# Patient Record
Sex: Male | Born: 1970 | Race: White | Hispanic: No | Marital: Single | Smoking: Current every day smoker
Health system: Southern US, Community
[De-identification: ages and names within clinical notes are randomized; demographics above are authoritative.]

## PROBLEM LIST (undated history)

## (undated) DIAGNOSIS — F1010 Alcohol abuse, uncomplicated: Secondary | ICD-10-CM

---

## 2018-10-10 ENCOUNTER — Emergency Department (HOSPITAL_BASED_OUTPATIENT_CLINIC_OR_DEPARTMENT_OTHER)
Admission: EM | Admit: 2018-10-10 | Discharge: 2018-10-10 | Disposition: A | Discharge status: Home / Self Care | Payer: 59 | Attending: Emergency Medicine | Admitting: Emergency Medicine

## 2018-10-10 ENCOUNTER — Encounter (HOSPITAL_BASED_OUTPATIENT_CLINIC_OR_DEPARTMENT_OTHER): Payer: Self-pay | Admitting: *Deleted

## 2018-10-10 DIAGNOSIS — H53149 Visual discomfort, unspecified: Secondary | ICD-10-CM | POA: Insufficient documentation

## 2018-10-10 DIAGNOSIS — R42 Dizziness and giddiness: Secondary | ICD-10-CM | POA: Diagnosis present

## 2018-10-10 DIAGNOSIS — F0781 Postconcussional syndrome: Secondary | ICD-10-CM

## 2018-10-10 LAB — CBC WITH DIFFERENTIAL/PLATELET
Abs Immature Granulocytes: 0.03 10*3/uL (ref 0.00–0.07)
Basophils Absolute: 0.1 10*3/uL (ref 0.0–0.1)
Basophils Relative: 1 %
Eosinophils Absolute: 0.3 10*3/uL (ref 0.0–0.5)
Eosinophils Relative: 3 %
HCT: 44.5 % (ref 39.0–52.0)
Hemoglobin: 14.7 g/dL (ref 13.0–17.0)
Immature Granulocytes: 0 %
Lymphocytes Relative: 23 %
Lymphs Abs: 2 10*3/uL (ref 0.7–4.0)
MCH: 33.4 pg (ref 26.0–34.0)
MCHC: 33 g/dL (ref 30.0–36.0)
MCV: 101.1 fL — ABNORMAL HIGH (ref 80.0–100.0)
Monocytes Absolute: 0.9 10*3/uL (ref 0.1–1.0)
Monocytes Relative: 10 %
Neutro Abs: 5.3 10*3/uL (ref 1.7–7.7)
Neutrophils Relative %: 63 %
Platelets: 273 10*3/uL (ref 150–400)
RBC: 4.4 MIL/uL (ref 4.22–5.81)
RDW: 13.2 % (ref 11.5–15.5)
WBC: 8.5 10*3/uL (ref 4.0–10.5)
nRBC: 0 % (ref 0.0–0.2)

## 2018-10-10 LAB — BASIC METABOLIC PANEL
Anion gap: 9 (ref 5–15)
BUN: 13 mg/dL (ref 6–20)
CO2: 22 mmol/L (ref 22–32)
Calcium: 8.7 mg/dL — ABNORMAL LOW (ref 8.9–10.3)
Chloride: 107 mmol/L (ref 98–111)
Creatinine, Ser: 0.75 mg/dL (ref 0.61–1.24)
GFR calc Af Amer: >60 mL/min (ref >60)
GFR calc non Af Amer: >60 mL/min (ref >60)
Glucose, Bld: 88 mg/dL (ref 70–99)
Potassium: 3.6 mmol/L (ref 3.5–5.1)
Sodium: 138 mmol/L (ref 135–145)

## 2018-10-10 MED ORDER — MECLIZINE HCL 25 MG PO TABS
25.0000 mg | ORAL_TABLET | Freq: Once | ORAL | Status: AC
  Administered 2018-10-10: 25 mg via ORAL
  Filled 2018-10-10: qty 1

## 2018-10-10 MED ORDER — MECLIZINE HCL 25 MG PO TABS: 25.0000 mg | ORAL_TABLET | Freq: Three times a day (TID) | ORAL | 0 refills | Status: DC | PRN

## 2018-10-10 NOTE — ED Notes (Signed)
Pt verbalized understanding of dc instructions.
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
## 2018-10-10 NOTE — Discharge Instructions (Signed)
It was my pleasure taking care of you today!   I have given you a prescription for Antivert (same medication you received in ER today) that you can take as needed for dizziness. Stay hydrated. Try your best to avoid triggers such as bright lights or loud sounds.   Call the sports medicine clinic listed to schedule a follow up appointment to further discuss your continued symptoms. I would recommend calling them tomorrow morning.   Return to ER for new or worsening symptoms, any additional concerns.

## 2019-09-21 ENCOUNTER — Inpatient Hospital Stay (HOSPITAL_BASED_OUTPATIENT_CLINIC_OR_DEPARTMENT_OTHER)
Admission: EM | Admit: 2019-09-21 | Discharge: 2019-09-27 | DRG: 871 | Disposition: A | Discharge status: Home / Self Care | Payer: 59 | Attending: Internal Medicine | Admitting: Internal Medicine

## 2019-09-21 ENCOUNTER — Other Ambulatory Visit: Payer: Self-pay

## 2019-09-21 ENCOUNTER — Emergency Department (HOSPITAL_BASED_OUTPATIENT_CLINIC_OR_DEPARTMENT_OTHER): Payer: 59

## 2019-09-21 ENCOUNTER — Inpatient Hospital Stay (HOSPITAL_BASED_OUTPATIENT_CLINIC_OR_DEPARTMENT_OTHER): Payer: 59

## 2019-09-21 ENCOUNTER — Encounter (HOSPITAL_BASED_OUTPATIENT_CLINIC_OR_DEPARTMENT_OTHER): Payer: Self-pay | Admitting: *Deleted

## 2019-09-21 DIAGNOSIS — R079 Chest pain, unspecified: Secondary | ICD-10-CM | POA: Diagnosis present

## 2019-09-21 DIAGNOSIS — J9601 Acute respiratory failure with hypoxia: Secondary | ICD-10-CM | POA: Diagnosis present

## 2019-09-21 DIAGNOSIS — A419 Sepsis, unspecified organism: Secondary | ICD-10-CM | POA: Diagnosis present

## 2019-09-21 DIAGNOSIS — I447 Left bundle-branch block, unspecified: Secondary | ICD-10-CM | POA: Diagnosis present

## 2019-09-21 DIAGNOSIS — E440 Moderate protein-calorie malnutrition: Secondary | ICD-10-CM | POA: Diagnosis present

## 2019-09-21 DIAGNOSIS — E871 Hypo-osmolality and hyponatremia: Secondary | ICD-10-CM | POA: Diagnosis present

## 2019-09-21 DIAGNOSIS — R6520 Severe sepsis without septic shock: Secondary | ICD-10-CM | POA: Diagnosis present

## 2019-09-21 DIAGNOSIS — Z20822 Contact with and (suspected) exposure to covid-19: Secondary | ICD-10-CM | POA: Diagnosis present

## 2019-09-21 DIAGNOSIS — F17210 Nicotine dependence, cigarettes, uncomplicated: Secondary | ICD-10-CM | POA: Diagnosis present

## 2019-09-21 DIAGNOSIS — E878 Other disorders of electrolyte and fluid balance, not elsewhere classified: Secondary | ICD-10-CM | POA: Diagnosis present

## 2019-09-21 DIAGNOSIS — R9431 Abnormal electrocardiogram [ECG] [EKG]: Secondary | ICD-10-CM | POA: Diagnosis not present

## 2019-09-21 DIAGNOSIS — Z9689 Presence of other specified functional implants: Secondary | ICD-10-CM

## 2019-09-21 DIAGNOSIS — E861 Hypovolemia: Secondary | ICD-10-CM | POA: Diagnosis present

## 2019-09-21 DIAGNOSIS — E876 Hypokalemia: Secondary | ICD-10-CM | POA: Diagnosis present

## 2019-09-21 DIAGNOSIS — D649 Anemia, unspecified: Secondary | ICD-10-CM | POA: Diagnosis present

## 2019-09-21 DIAGNOSIS — J189 Pneumonia, unspecified organism: Secondary | ICD-10-CM | POA: Diagnosis present

## 2019-09-21 DIAGNOSIS — R7989 Other specified abnormal findings of blood chemistry: Secondary | ICD-10-CM

## 2019-09-21 DIAGNOSIS — A409 Streptococcal sepsis, unspecified: Secondary | ICD-10-CM | POA: Diagnosis not present

## 2019-09-21 DIAGNOSIS — Z6823 Body mass index (BMI) 23.0-23.9, adult: Secondary | ICD-10-CM | POA: Diagnosis not present

## 2019-09-21 DIAGNOSIS — G9341 Metabolic encephalopathy: Secondary | ICD-10-CM | POA: Diagnosis present

## 2019-09-21 DIAGNOSIS — F1010 Alcohol abuse, uncomplicated: Secondary | ICD-10-CM | POA: Diagnosis present

## 2019-09-21 DIAGNOSIS — Z720 Tobacco use: Secondary | ICD-10-CM | POA: Diagnosis not present

## 2019-09-21 DIAGNOSIS — J869 Pyothorax without fistula: Secondary | ICD-10-CM | POA: Diagnosis present

## 2019-09-21 DIAGNOSIS — F17200 Nicotine dependence, unspecified, uncomplicated: Secondary | ICD-10-CM | POA: Diagnosis present

## 2019-09-21 HISTORY — DX: Alcohol abuse, uncomplicated: F10.10

## 2019-09-21 LAB — BLOOD GAS, ARTERIAL
Acid-base deficit: 1.1 mmol/L (ref 0.0–2.0)
Bicarbonate: 22.9 mmol/L (ref 20.0–28.0)
Drawn by: 36277
FIO2: 40
O2 Saturation: 95.2 %
Patient temperature: 36.5
pCO2 arterial: 36.2 mmHg (ref 32.0–48.0)
pH, Arterial: 7.415 (ref 7.350–7.450)
pO2, Arterial: 78.8 mmHg — ABNORMAL LOW (ref 83.0–108.0)

## 2019-09-21 LAB — CBC WITH DIFFERENTIAL/PLATELET
Abs Immature Granulocytes: 0.15 10*3/uL — ABNORMAL HIGH (ref 0.00–0.07)
Basophils Absolute: 0.1 10*3/uL (ref 0.0–0.1)
Basophils Relative: 0 %
Eosinophils Absolute: 0 10*3/uL (ref 0.0–0.5)
Eosinophils Relative: 0 %
HCT: 31.7 % — ABNORMAL LOW (ref 39.0–52.0)
Hemoglobin: 11.1 g/dL — ABNORMAL LOW (ref 13.0–17.0)
Immature Granulocytes: 1 %
Lymphocytes Relative: 5 %
Lymphs Abs: 1.2 10*3/uL (ref 0.7–4.0)
MCH: 32.5 pg (ref 26.0–34.0)
MCHC: 35 g/dL (ref 30.0–36.0)
MCV: 92.7 fL (ref 80.0–100.0)
Monocytes Absolute: 1.7 10*3/uL — ABNORMAL HIGH (ref 0.1–1.0)
Monocytes Relative: 8 %
Neutro Abs: 18.4 10*3/uL — ABNORMAL HIGH (ref 1.7–7.7)
Neutrophils Relative %: 86 %
Platelets: 406 10*3/uL — ABNORMAL HIGH (ref 150–400)
RBC: 3.42 MIL/uL — ABNORMAL LOW (ref 4.22–5.81)
RDW: 13.4 % (ref 11.5–15.5)
WBC: 21.4 10*3/uL — ABNORMAL HIGH (ref 4.0–10.5)
nRBC: 0 % (ref 0.0–0.2)

## 2019-09-21 LAB — COMPREHENSIVE METABOLIC PANEL
ALT: 53 U/L — ABNORMAL HIGH (ref 0–44)
AST: 43 U/L — ABNORMAL HIGH (ref 15–41)
Albumin: 2.2 g/dL — ABNORMAL LOW (ref 3.5–5.0)
Alkaline Phosphatase: 134 U/L — ABNORMAL HIGH (ref 38–126)
Anion gap: 13 (ref 5–15)
BUN: 11 mg/dL (ref 6–20)
CO2: 22 mmol/L (ref 22–32)
Calcium: 7.8 mg/dL — ABNORMAL LOW (ref 8.9–10.3)
Chloride: 91 mmol/L — ABNORMAL LOW (ref 98–111)
Creatinine, Ser: 0.75 mg/dL (ref 0.61–1.24)
GFR calc Af Amer: >60 mL/min (ref >60)
GFR calc non Af Amer: >60 mL/min (ref >60)
Glucose, Bld: 180 mg/dL — ABNORMAL HIGH (ref 70–99)
Potassium: 3.4 mmol/L — ABNORMAL LOW (ref 3.5–5.1)
Sodium: 126 mmol/L — ABNORMAL LOW (ref 135–145)
Total Bilirubin: 1.5 mg/dL — ABNORMAL HIGH (ref 0.3–1.2)
Total Protein: 7.8 g/dL (ref 6.5–8.1)

## 2019-09-21 LAB — URINALYSIS, ROUTINE W REFLEX MICROSCOPIC
Bilirubin Urine: NEGATIVE
Glucose, UA: NEGATIVE mg/dL
Ketones, ur: NEGATIVE mg/dL
Leukocytes,Ua: NEGATIVE
Nitrite: NEGATIVE
Protein, ur: NEGATIVE mg/dL
Specific Gravity, Urine: <1.005 — ABNORMAL LOW (ref 1.005–1.030)
pH: 6 (ref 5.0–8.0)

## 2019-09-21 LAB — URINALYSIS, MICROSCOPIC (REFLEX): WBC, UA: NONE SEEN WBC/hpf (ref 0–5)

## 2019-09-21 LAB — TROPONIN I (HIGH SENSITIVITY)
Troponin I (High Sensitivity): 8 ng/L (ref <18)
Troponin I (High Sensitivity): 8 ng/L (ref <18)

## 2019-09-21 LAB — LACTIC ACID, PLASMA: Lactic Acid, Venous: 2 mmol/L (ref 0.5–1.9)

## 2019-09-21 LAB — SARS CORONAVIRUS 2 BY RT PCR (HOSPITAL ORDER, PERFORMED IN ~~LOC~~ HOSPITAL LAB): SARS Coronavirus 2: NEGATIVE

## 2019-09-21 LAB — PROTIME-INR
INR: 1.4 — ABNORMAL HIGH (ref 0.8–1.2)
Prothrombin Time: 16.4 seconds — ABNORMAL HIGH (ref 11.4–15.2)

## 2019-09-21 LAB — APTT: aPTT: 37 seconds — ABNORMAL HIGH (ref 24–36)

## 2019-09-21 MED ORDER — POTASSIUM CHLORIDE CRYS ER 20 MEQ PO TBCR
30.0000 meq | EXTENDED_RELEASE_TABLET | Freq: Once | ORAL | Status: AC
  Administered 2019-09-21: 30 meq via ORAL
  Filled 2019-09-21: qty 1

## 2019-09-21 MED ORDER — ACETAMINOPHEN 500 MG PO TABS
ORAL_TABLET | ORAL | Status: AC
  Filled 2019-09-21: qty 2

## 2019-09-21 MED ORDER — LORAZEPAM 1 MG PO TABS
1.0000 mg | ORAL_TABLET | ORAL | Status: AC | PRN
  Administered 2019-09-23: 2 mg via ORAL
  Filled 2019-09-21: qty 2

## 2019-09-21 MED ORDER — THIAMINE HCL 100 MG PO TABS
100.0000 mg | ORAL_TABLET | Freq: Every day | ORAL | Status: DC
  Administered 2019-09-23 – 2019-09-27 (×4): 100 mg via ORAL
  Filled 2019-09-21 (×4): qty 1

## 2019-09-21 MED ORDER — METRONIDAZOLE IN NACL 5-0.79 MG/ML-% IV SOLN
500.0000 mg | Freq: Once | INTRAVENOUS | Status: AC
  Administered 2019-09-21: 500 mg via INTRAVENOUS
  Filled 2019-09-21: qty 100

## 2019-09-21 MED ORDER — ACETAMINOPHEN 500 MG PO TABS
1000.0000 mg | ORAL_TABLET | Freq: Once | ORAL | Status: AC
  Administered 2019-09-21: 1000 mg via ORAL

## 2019-09-21 MED ORDER — VANCOMYCIN HCL IN DEXTROSE 1-5 GM/200ML-% IV SOLN: 1000.0000 mg | Freq: Once | INTRAVENOUS | Status: DC

## 2019-09-21 MED ORDER — IOHEXOL 350 MG/ML SOLN
100.0000 mL | Freq: Once | INTRAVENOUS | Status: AC | PRN
  Administered 2019-09-21: 100 mL via INTRAVENOUS

## 2019-09-21 MED ORDER — ALBUTEROL SULFATE HFA 108 (90 BASE) MCG/ACT IN AERS
2.0000 | INHALATION_SPRAY | Freq: Once | RESPIRATORY_TRACT | Status: AC
  Administered 2019-09-21: 2 via RESPIRATORY_TRACT
  Filled 2019-09-21: qty 6.7

## 2019-09-21 MED ORDER — DEXAMETHASONE SODIUM PHOSPHATE 10 MG/ML IJ SOLN
10.0000 mg | Freq: Once | INTRAMUSCULAR | Status: AC
  Administered 2019-09-21: 10 mg via INTRAVENOUS
  Filled 2019-09-21: qty 1

## 2019-09-21 MED ORDER — ACETAMINOPHEN 325 MG PO TABS
650.0000 mg | ORAL_TABLET | Freq: Four times a day (QID) | ORAL | Status: DC | PRN
  Administered 2019-09-26 (×2): 650 mg via ORAL
  Filled 2019-09-21 (×2): qty 2

## 2019-09-21 MED ORDER — ALBUTEROL SULFATE HFA 108 (90 BASE) MCG/ACT IN AERS
2.0000 | INHALATION_SPRAY | Freq: Once | RESPIRATORY_TRACT | Status: DC
  Filled 2019-09-21: qty 6.7

## 2019-09-21 MED ORDER — LORAZEPAM 2 MG/ML IJ SOLN
1.0000 mg | INTRAMUSCULAR | Status: AC | PRN
  Administered 2019-09-22 – 2019-09-23 (×2): 2 mg via INTRAVENOUS
  Filled 2019-09-21 (×2): qty 1

## 2019-09-21 MED ORDER — VANCOMYCIN HCL IN DEXTROSE 1-5 GM/200ML-% IV SOLN
1000.0000 mg | Freq: Three times a day (TID) | INTRAVENOUS | Status: DC
  Administered 2019-09-22 – 2019-09-24 (×7): 1000 mg via INTRAVENOUS
  Filled 2019-09-21 (×9): qty 200

## 2019-09-21 MED ORDER — ADULT MULTIVITAMIN W/MINERALS CH
1.0000 | ORAL_TABLET | Freq: Every day | ORAL | Status: DC
  Administered 2019-09-23 – 2019-09-27 (×5): 1 via ORAL
  Filled 2019-09-21 (×5): qty 1

## 2019-09-21 MED ORDER — SODIUM CHLORIDE 0.9 % IV SOLN
2.0000 g | Freq: Once | INTRAVENOUS | Status: AC
  Administered 2019-09-21: 2 g via INTRAVENOUS
  Filled 2019-09-21: qty 2

## 2019-09-21 MED ORDER — SODIUM CHLORIDE 0.9 % IV BOLUS
1000.0000 mL | Freq: Once | INTRAVENOUS | Status: AC
  Administered 2019-09-21: 1000 mL via INTRAVENOUS

## 2019-09-21 MED ORDER — SODIUM CHLORIDE 0.9 % IV SOLN
2.0000 g | Freq: Three times a day (TID) | INTRAVENOUS | Status: DC
  Administered 2019-09-21 – 2019-09-24 (×7): 2 g via INTRAVENOUS
  Filled 2019-09-21 (×11): qty 2

## 2019-09-21 MED ORDER — METRONIDAZOLE IN NACL 5-0.79 MG/ML-% IV SOLN
500.0000 mg | Freq: Three times a day (TID) | INTRAVENOUS | Status: DC
  Administered 2019-09-21 – 2019-09-24 (×8): 500 mg via INTRAVENOUS
  Filled 2019-09-21 (×8): qty 100

## 2019-09-21 MED ORDER — ACETAMINOPHEN 650 MG RE SUPP: 650.0000 mg | Freq: Four times a day (QID) | RECTAL | Status: DC | PRN

## 2019-09-21 MED ORDER — SODIUM CHLORIDE 0.9 % IV SOLN: INTRAVENOUS | Status: AC

## 2019-09-21 MED ORDER — THIAMINE HCL 100 MG/ML IJ SOLN
100.0000 mg | Freq: Every day | INTRAMUSCULAR | Status: DC
  Administered 2019-09-22 – 2019-09-24 (×2): 100 mg via INTRAVENOUS
  Filled 2019-09-21 (×4): qty 2

## 2019-09-21 MED ORDER — KETOROLAC TROMETHAMINE 30 MG/ML IJ SOLN
30.0000 mg | Freq: Four times a day (QID) | INTRAMUSCULAR | Status: AC | PRN
  Administered 2019-09-23 – 2019-09-26 (×8): 30 mg via INTRAVENOUS
  Filled 2019-09-21 (×8): qty 1

## 2019-09-21 MED ORDER — FOLIC ACID 1 MG PO TABS
1.0000 mg | ORAL_TABLET | Freq: Every day | ORAL | Status: DC
  Administered 2019-09-23 – 2019-09-27 (×5): 1 mg via ORAL
  Filled 2019-09-21 (×5): qty 1

## 2019-09-21 MED ORDER — VANCOMYCIN HCL IN DEXTROSE 1-5 GM/200ML-% IV SOLN
1000.0000 mg | INTRAVENOUS | Status: AC
  Administered 2019-09-21: 1000 mg via INTRAVENOUS
  Filled 2019-09-21: qty 200

## 2019-09-21 NOTE — ED Provider Notes (Signed)
[facility] EMERGENCY DEPARTMENT Provider Note   CSN: [id number] Arrival date & time: 09/21/19  1540     History Chief Complaint  Patient presents with  . Shortness of Breath    Victor Torres is a 49 y.o. male.  HPI   Patient is a 49 year old male who presents to the emergency department today for evaluation of shortness of breath ongoing for the last 2 weeks.  He was seen by his PCP and had a negative Covid test.  He already so had a CT scan of his chest which showed an empyema.  He was scheduled for a follow-up appointment tomorrow in regards to this however he became too short of breath today so came to the ED.  States he has been having intermittent fevers.  He also has left upper chest pain and pain with inspiration.  He has had a productive cough with foul-smelling secretions.  He has been placed on antibiotics however finish these and symptoms have not improved.  He denies any other symptoms at this time.  History reviewed. No pertinent past medical history.  Patient Active Problem List   Diagnosis Date Noted  . Empyema ([place]) 09/21/2019    History reviewed. No pertinent surgical history.     No family history on file.  Social History   Tobacco Use  . Smoking status: Current Every Day Smoker    Types: Cigarettes  . Smokeless tobacco: Never Used  Substance Use Topics  . Alcohol use: Not Currently  . Drug use: Not Currently    Types: Cocaine    Home Medications Prior to Admission medications   Medication Sig Start Date End Date Taking? Authorizing Provider  meclizine (ANTIVERT) 25 MG tablet Take 1 tablet (25 mg total) by mouth 3 (three) times daily as needed for dizziness. 10/10/18   Ward, Ozella Almond, PA-C    Allergies    Patient has no known allergies.  Review of Systems   Review of Systems  Constitutional: Positive for chills, diaphoresis and fever.  HENT: Negative for ear pain and sore throat.   Eyes: Negative for pain and visual  disturbance.  Respiratory: Positive for cough and shortness of breath.   Cardiovascular: Positive for chest pain.  Gastrointestinal: Negative for abdominal pain, constipation, diarrhea, nausea and vomiting.  Genitourinary: Negative for dysuria and hematuria.  Musculoskeletal: Negative for back pain.  Skin: Negative for rash.  Neurological: Negative for headaches.  All other systems reviewed and are negative.   Physical Exam Updated Vital Signs BP (!) 124/45 (BP Location: Right Arm)   Pulse 99   Temp (!) 103.2 F (39.6 C) (Rectal)   Resp (!) 39   Ht 6\' 1"  (1.854 m)   Wt 81.6 kg   SpO2 96%   BMI 23.75 kg/m   Physical Exam Vitals and nursing note reviewed.  Constitutional:      General: He is in acute distress.     Appearance: He is well-developed. He is toxic-appearing.  HENT:     Head: Normocephalic and atraumatic.  Eyes:     Conjunctiva/sclera: Conjunctivae normal.  Cardiovascular:     Rate and Rhythm: Regular rhythm. Tachycardia present.     Heart sounds: No murmur.  Pulmonary:     Effort: Pulmonary effort is normal. Tachypnea present. No respiratory distress.     Breath sounds: Examination of the right-middle field reveals rales. Examination of the right-lower field reveals rales. Rales present.     Comments: Speaking in short sentences Abdominal:  General: Bowel sounds are normal.     Palpations: Abdomen is soft.     Tenderness: There is no abdominal tenderness. There is no guarding or rebound.  Musculoskeletal:     Cervical back: Neck supple.     Right lower leg: No tenderness.     Left lower leg: No tenderness.  Skin:    General: Skin is warm and dry.  Neurological:     Mental Status: He is alert.     ED Results / Procedures / Treatments   Labs (all labs ordered are listed, but only abnormal results are displayed) Labs Reviewed  LACTIC ACID, PLASMA - Abnormal; Notable for the following components:      Result Value   Lactic Acid, Venous 2.0 (*)     All other components within normal limits  COMPREHENSIVE METABOLIC PANEL - Abnormal; Notable for the following components:   Sodium 126 (*)    Potassium 3.4 (*)    Chloride 91 (*)    Glucose, Bld 180 (*)    Calcium 7.8 (*)    Albumin 2.2 (*)    AST 43 (*)    ALT 53 (*)    Alkaline Phosphatase 134 (*)    Total Bilirubin 1.5 (*)    All other components within normal limits  CBC WITH DIFFERENTIAL/PLATELET - Abnormal; Notable for the following components:   WBC 21.4 (*)    RBC 3.42 (*)    Hemoglobin 11.1 (*)    HCT 31.7 (*)    Platelets 406 (*)    Neutro Abs 18.4 (*)    Monocytes Absolute 1.7 (*)    Abs Immature Granulocytes 0.15 (*)    All other components within normal limits  APTT - Abnormal; Notable for the following components:   aPTT 37 (*)    All other components within normal limits  PROTIME-INR - Abnormal; Notable for the following components:   Prothrombin Time 16.4 (*)    INR 1.4 (*)    All other components within normal limits  SARS CORONAVIRUS 2 BY RT PCR (HOSPITAL ORDER, PERFORMED IN [facility])  CULTURE, BLOOD (ROUTINE X 2)  CULTURE, BLOOD (ROUTINE X 2)  URINE CULTURE  LACTIC ACID, PLASMA  URINALYSIS, ROUTINE W REFLEX MICROSCOPIC  TROPONIN I (HIGH SENSITIVITY)  TROPONIN I (HIGH SENSITIVITY)    EKG EKG Interpretation  Date/Time:  Sunday Sep 21 2019 16:01:43 EDT Ventricular Rate:  106 PR Interval:    QRS Duration: 111 QT Interval:  334 QTC Calculation: 444 R Axis:   28 Text Interpretation: Ectopic atrial tachycardia, unifocal Probable left atrial enlargement Incomplete left bundle branch block Borderline ST elevation, anterior leads Baseline wander in lead(s) V6 Confirmed by Virgina Norfolk [phone]) on 09/21/2019 4:05:41 PM   Radiology DG Chest Portable 1 View  Result Date: 09/21/2019 CLINICAL DATA:  Elevated white blood cell count. EXAM: PORTABLE CHEST 1 VIEW COMPARISON:  CT scan Sep 15, 2019 FINDINGS: A rounded collection of air in the  lower left hemithorax is at the site of the suspected empyema seen on the CT scan of the chest from Sep 15, 2019. There appears to be more air in the collection today but there is also fluid. Opacity in the left base is identified. No pneumothorax. The right lung is clear. The cardiomediastinal silhouette is otherwise stable. IMPRESSION: 1. Suspected empyema in the left base with more air but persistent fluid. Suspected opacity adjacent to the empyema could be pneumonia or atelectasis. CT imaging could better evaluate. Electronically Signed  By: Gerome Sam III M.D   On: 09/21/2019 17:01    Procedures Procedures (including critical care time) CRITICAL CARE Performed by: Karrie Meres   Total critical care time: 40 minutes  Critical care time was exclusive of separately billable procedures and treating other patients.  Critical care was necessary to treat or prevent imminent or life-threatening deterioration.  Critical care was time spent personally by me on the following activities: development of treatment plan with patient and/or surrogate as well as nursing, discussions with consultants, evaluation of patient's response to treatment, examination of patient, obtaining history from patient or surrogate, ordering and performing treatments and interventions, ordering and review of laboratory studies, ordering and review of radiographic studies, pulse oximetry and re-evaluation of patient's condition.   Medications Ordered in ED Medications  albuterol (VENTOLIN HFA) 108 (90 Base) MCG/ACT inhaler 2 puff (2 puffs Inhalation Not Given 09/21/19 1626)  vancomycin (VANCOCIN) IVPB 1000 mg/200 mL premix (1,000 mg Intravenous New Bag/Given 09/21/19 1708)  acetaminophen (TYLENOL) 500 MG tablet (has no administration in time range)  albuterol (VENTOLIN HFA) 108 (90 Base) MCG/ACT inhaler 2 puff (2 puffs Inhalation Given 09/21/19 1557)  ceFEPIme (MAXIPIME) 2 g in sodium chloride 0.9 % 100 mL IVPB (0 g  Intravenous Stopped 09/21/19 1640)  metroNIDAZOLE (FLAGYL) IVPB 500 mg (0 mg Intravenous Stopped 09/21/19 1705)  sodium chloride 0.9 % bolus 1,000 mL (1,000 mLs Intravenous New Bag/Given 09/21/19 1628)  dexamethasone (DECADRON) injection 10 mg (10 mg Intravenous Given 09/21/19 1626)  acetaminophen (TYLENOL) tablet 1,000 mg (1,000 mg Oral Given 09/21/19 1638)    ED Course  I have reviewed the triage vital signs and the nursing notes.  Pertinent labs & imaging results that were available during my care of the patient were reviewed by me and considered in my medical decision making (see chart for details).    MDM Rules/Calculators/A&P                      49 year old male presenting for evaluation of 2-week history of shortness of breath that worsened today.  Reviewed prior records.  He has been tested for Covid which was negative.  He also had a CT scan of the chest with contrast on 09/17/2019 which showed, "Multiloculated gas and fluid collection noted laterally in the left hemithorax concerning for empyema collection noted. Overlying airspace disease likely reflects atelectasis.  Patient's initial vital signs demonstrated hypoxia with O2 sat at 88% on room air requiring 5 L O2.  Also with tachypnea, tachycardia and also with borderline temp.  He is not hypotensive.  Given vital signs and suspected infection with known empyema, code sepsis was initiated.  IV fluids were ordered as well as broad-spectrum antibiotics were ordered.  Patient was also given Decadron and albuterol given concern for possible underlying undiagnosed COPD which could be exacerbating her hypoxia.  Rectal temp later positive.   Reviewed/interpreted lab CBC with leukocytosis and mild anemia CMP with mild hyponatremia, hypochloremia, and hypokalemia.  Liver enzymes are marginally elevated as well as bilirubin.  Kidney function is normal Troponin is negative Coags marginally elevated Lactic acid slightly elevated Covid test  negative UA pending on admission Blood cultures were obtained  EKG with Ectopic atrial tachycardia, unifocal Probable left atrial enlargement Incomplete left bundle branch block Borderline ST elevation, anterior leads Baseline wander in lead(s) V6  CXR reviewed/interpreted - suspected empyema in the left base with more air but persistent fluid. Suspected opacity adjacent to the empyema could be  pneumonia or atelectasis.  Pt is critically ill with sepsis and acute hypoxic respiratory failure secondary to suspected pneumonia and empyema. He will require admission for further treatment.   5:51 PM CONSULT with Dr. Chipper Herb with hospitalist service who accepts patient for admission at [facility]. He recommends repeating CT scan of the chest.   Pt seen in conjunction with Dr. Lockie Mola who personally evaluated the patient and is in agreement with the plan.   Final Clinical Impression(s) / ED Diagnoses Final diagnoses:  Acute respiratory failure with hypoxia (HCC)  Empyema [facility])    Rx / DC Orders ED Discharge Orders    None       Karrie Meres, PA-C 09/21/19 1754    Virgina Norfolk, DO 09/21/19 1810

## 2019-09-21 NOTE — ED Notes (Signed)
Date and time results received: 09/21/19 1654  Test:lactic acid Critical Value: 2.0  Name of Provider Notified: [place]  Orders Received? Or Actions Taken?: no orders given

## 2019-09-21 NOTE — ED Provider Notes (Signed)
Medical screening examination/treatment/procedure(s) were conducted as a shared visit with non-physician practitioner(s) and myself.  I personally evaluated the patient during the encounter. Briefly, the patient is a 49 y.o. male with history of smoking who presents to the ED with shortness of breath.  Patient febrile, tachycardic, tachypneic, hypoxic upon arrival.  Improved on 4 L of oxygen.  Patient had recently been diagnosed with pneumonia and had been on antibiotics that he did not finish.  He was supposed to see Dr. Bea Laura.  CT scan on the 17th for patient with pleural mass showed multiloculated gas and fluid collection noted laterally in the left hemothorax concerning for empyema collection.  Has felt worse stone this past several days.  He has diminished air movement throughout.  Sepsis work-up has been initiated.  Broad-spectrum IV antibiotics have been ordered.  Will obtain chest x-ray.  Anticipate admission for further sepsis care.  Likely will need some type of thoracentesis at some point seeing CT scan a week ago.  Possibly underlying malignancy.  White count 21, lactic acid 2.0.  Chest x-ray shows suspected empyema and pneumonia.  We will get a CT scan for inpatient planning.  Patient septic from pneumonia.  Broad-spectrum IV antibiotics have been given.  Hemodynamically patient is stable.  To be admitted.  This chart was dictated using voice recognition software.  Despite best efforts to proofread,  errors can occur which can change the documentation meaning.     EKG Interpretation  Date/Time:  Sunday Sep 21 2019 16:01:43 EDT Ventricular Rate:  106 PR Interval:    QRS Duration: 111 QT Interval:  334 QTC Calculation: 444 R Axis:   28 Text Interpretation: Ectopic atrial tachycardia, unifocal Probable left atrial enlargement Incomplete left bundle branch block Borderline ST elevation, anterior leads Baseline wander in lead(s) V6 Confirmed by Virgina Norfolk [phone]) on 09/21/2019 4:05:41  PM           Virgina Norfolk, DO 09/21/19 1712

## 2019-09-21 NOTE — ED Triage Notes (Addendum)
Pt reports ongoing SOB x 3 weeks. He has been dx with PNA and has finished antibiotics. States SOB is worse today. O2 sats 86-88% on room air in triage. Assessed by RT. Placed on 3L [place] with sats up to 90%. Reports he has been coughing up foul smelling secretions

## 2019-09-21 NOTE — Progress Notes (Signed)
Pharmacy Antibiotic Note  Victor Torres is a 49 y.o. male admitted on 09/21/2019 with sepsis.  Pharmacy has been consulted for Cefepime and Vancomycin dosing.   Height: 6\' 1"  (185.4 cm) Weight: 81.6 kg (180 lb) IBW/kg (Calculated) : 79.9  Temp (24hrs), Avg:101.4 F (38.6 C), Min:99.5 F (37.5 C), Max:103.2 F (39.6 C)  Recent Labs  Lab 09/21/19 1618  WBC 21.4*  CREATININE 0.75  LATICACIDVEN 2.0*    Estimated Creatinine Clearance: 126.2 mL/min (by C-G formula based on SCr of 0.75 mg/dL).    No Known Allergies  Antimicrobials this admission: 5/23 Cefepime >>  5/23 Vancomycin >>   Dose adjustments this admission: N/a  Microbiology results: Pending   Plan:  - Cefepime 2g IV q8h - Vancomycin 2000mg  IV x 1 dose  - Followed by Vancomycin 1000mg  IV q8h - Monitor patients renal function and urine output  - De-escalate ABX when appropriate   Thank you for allowing pharmacy to be a part of this patient's care.  6/23 PharmD. BCPS 09/21/2019 6:10 PM

## 2019-09-21 NOTE — Progress Notes (Addendum)
Paged by RN that since patient's arrival in last 2 hours, he has been complaining of chest pain in left chest and left shoulder. Chart review shows patient here for likely sepsis from PNA. Trop neg x2, patient on Tele and EKG with LBBB but no STEMI. Patient awaiting admit orders so admitting physician should see soon. Will order one more Trop around 2300. If admitting physician does not see soon, please page back.   Jerilynn Birkenhead, MD [facility]

## 2019-09-21 NOTE — ED Notes (Signed)
[facility] notified (Tammy) - patient ready for transport 

## 2019-09-21 NOTE — H&P (Signed)
History and Physical    Victor Torres HLK:[id number] DOB: [birth year] DOA: 09/21/2019  PCP: Patient, No Pcp Per Patient coming from: [facility]  Chief Complaint: Shortness of breath  HPI: Victor Torres is a 49 y.o. male with a past medical history of alcohol abuse presented to [place] ED with complaints of shortness of breath.  He was seen by his PCP and had a negative Covid test.  He had a CT scan of his chest on the 17th which showed multiloculated gas and fluid collection laterally in the left hemithorax concerning for empyema.  He was scheduled for follow-up appointment tomorrow but became too short of breath and came into the ED. Patient reports 3-week history of progressively worsening shortness of breath.  For the past 1 week he has had fevers, chills, and is coughing up foul-smelling brown/pus colored sputum.  He is feeling very tired.  Endorsing decreased p.o. intake.  Also endorsing constant left lower chest pain for several days.  Denies nausea, vomiting, or diarrhea.  He was previously constipated but has had bowel movements for the past 2 days.  States he used to drink 1/5 of liquor daily but quit cold [place] a year ago.  He did have 3 shots before coming into the ED today.  He has been smoking 1 pack of cigarettes daily for the past 30 years.  ED Course: Oxygen saturation 86 to 88% on room air in triage, improved with 5 L supplemental oxygen.  Patient noted to be coughing up foul-smelling sputum.  Febrile with T-max 103.2 F.  Tachycardic and tachypneic.  Not hypotensive.  Labs showing significant leukocytosis with WBC count 21.4.  Lactic acid 2.0.  Hemoglobin 11.1, previously normal.  MCV 92.  Corrected sodium 127.  Potassium 3.4.  BUN 11, creatinine 0.7.  AST 43, ALT 53, alk phos 134, and T bili 1.5.  INR 1.4.  UA not suggestive of infection.  Blood cultures pending.  EKG with LBBB, no prior tracing for comparison.  High-sensitivity troponin checked twice negative.  SARS-CoV-2 PCR  test negative.  CT angiogram chest showing a 14 x 7.8 cm thick-walled (approximately 1.3 cm) cavitary lesion within the anterolateral aspect of the mid and lower left lung, increased in size compared to prior exam.  Findings felt to be consistent with an evolving empyema.  Also showing marked severely posterior right basilar atelectasis and/or infiltrate.  Mild, slightly nodular appearing right middle lobe infiltrate.  This is a new finding compared to the prior exam.  Patient received vancomycin, cefepime, metronidazole, 1 L normal saline bolus, IV Decadron 10 mg, albuterol inhaler treatment, and Tylenol.  Review of Systems:  All systems reviewed and apart from history of presenting illness, are negative.  Past Medical History:  Diagnosis Date  . Alcohol abuse     History reviewed. No pertinent surgical history.   reports that he has been smoking cigarettes. He has never used smokeless tobacco. He reports current alcohol use. He reports previous drug use. Drug: Cocaine.  No Known Allergies  History reviewed. No pertinent family history.  Prior to Admission medications   Medication Sig Start Date End Date Taking? Authorizing Provider  meclizine (ANTIVERT) 25 MG tablet Take 1 tablet (25 mg total) by mouth 3 (three) times daily as needed for dizziness. 10/10/18   Ward, Ozella Almond, PA-C    Physical Exam: Vitals:   09/21/19 1930 09/21/19 2000 09/21/19 2116 09/21/19 2127  BP: (!) 127/48 (!) 120/49  (!) 124/56  Pulse: 71 70  66  Resp: (!) 29 (!) 27  20  Temp:    97.6 F (36.4 C)  TempSrc:    Oral  SpO2: 95% 98%  91%  Weight:      Height:   '6\' 1"'  (1.854 m)     Physical Exam  Constitutional: He is oriented to person, place, and time. He appears well-developed and well-nourished. No distress.  HENT:  Head: Normocephalic.  Very dry mucous membranes  Eyes: Right eye exhibits no discharge. Left eye exhibits no discharge.  Cardiovascular: Normal rate, regular rhythm and intact  distal pulses.  Pulmonary/Chest: He is in respiratory distress. He has no wheezes. He has no rales.  Tachypneic Satting in the upper 90s on 5 L supplemental oxygen, however, desats to low 80s with minimal exertion/sitting up in the bed Decreased breath sounds at the left lung base  Abdominal: Soft. Bowel sounds are normal. He exhibits no distension. There is no abdominal tenderness. There is no guarding.  Musculoskeletal:        General: No edema.     Cervical back: Neck supple.  Neurological: He is alert and oriented to person, place, and time.  Skin: Skin is warm. He is diaphoretic.    Labs on Admission: I have personally reviewed following labs and imaging studies  CBC: Recent Labs  Lab 09/21/19 1618  WBC 21.4*  NEUTROABS 18.4*  HGB 11.1*  HCT 31.7*  MCV 92.7  PLT 638*   Basic Metabolic Panel: Recent Labs  Lab 09/21/19 1618  NA 126*  K 3.4*  CL 91*  CO2 22  GLUCOSE 180*  BUN 11  CREATININE 0.75  CALCIUM 7.8*   GFR: Estimated Creatinine Clearance: 126.2 mL/min (by C-G formula based on SCr of 0.75 mg/dL). Liver Function Tests: Recent Labs  Lab 09/21/19 1618  AST 43*  ALT 53*  ALKPHOS 134*  BILITOT 1.5*  PROT 7.8  ALBUMIN 2.2*   No results for input(s): LIPASE, AMYLASE in the last 168 hours. No results for input(s): AMMONIA in the last 168 hours. Coagulation Profile: Recent Labs  Lab 09/21/19 1618  INR 1.4*   Cardiac Enzymes: No results for input(s): CKTOTAL, CKMB, CKMBINDEX, TROPONINI in the last 168 hours. BNP (last 3 results) No results for input(s): PROBNP in the last 8760 hours. HbA1C: No results for input(s): HGBA1C in the last 72 hours. CBG: No results for input(s): GLUCAP in the last 168 hours. Lipid Profile: No results for input(s): CHOL, HDL, LDLCALC, TRIG, CHOLHDL, LDLDIRECT in the last 72 hours. Thyroid Function Tests: No results for input(s): TSH, T4TOTAL, FREET4, T3FREE, THYROIDAB in the last 72 hours. Anemia Panel: No results for  input(s): VITAMINB12, FOLATE, FERRITIN, TIBC, IRON, RETICCTPCT in the last 72 hours. Urine analysis:    Component Value Date/Time   COLORURINE YELLOW 09/21/2019 1619   APPEARANCEUR CLEAR 09/21/2019 1619   LABSPEC <1.005 (L) 09/21/2019 1619   PHURINE 6.0 09/21/2019 1619   GLUCOSEU NEGATIVE 09/21/2019 1619   HGBUR TRACE (A) 09/21/2019 1619   BILIRUBINUR NEGATIVE 09/21/2019 1619   KETONESUR NEGATIVE 09/21/2019 1619   PROTEINUR NEGATIVE 09/21/2019 1619   NITRITE NEGATIVE 09/21/2019 [place] 09/21/2019 1619    Radiological Exams on Admission: CT Angio Chest PE W and/or Wo Contrast  Result Date: 09/21/2019 CLINICAL DATA:  Shortness of breath. EXAM: CT ANGIOGRAPHY CHEST WITH CONTRAST TECHNIQUE: Multidetector CT imaging of the chest was performed using the standard protocol during bolus administration of intravenous contrast. Multiplanar CT image reconstructions and MIPs were obtained  to evaluate the vascular anatomy. CONTRAST:  178m OMNIPAQUE IOHEXOL 350 MG/ML SOLN COMPARISON:  None. FINDINGS: Cardiovascular: The subsegmental pulmonary arteries are limited in evaluation secondary to suboptimal opacification with intravenous contrast. This is most notable within the bilateral lung bases. No intraluminal filling defects are clearly identified. Normal heart size. No pericardial effusion. Mediastinum/Nodes: There is mild precarinal lymphadenopathy. Thyroid gland, trachea, and esophagus demonstrate no significant findings. Lungs/Pleura: A 14.0 cm x 7.8 cm thick walled (approximately 1.3 cm) cavitary lesion is seen within the anterolateral aspect of the mid and lower left lung. This is increased in size when compared to the prior exam. A single large air-fluid level is seen. A marked amount of adjacent posterior atelectasis and/or infiltrate is seen within the right lung base. This is increased in severity when compared to the prior study. Mild, slightly nodular appearing infiltrate is  seen within the inferior aspect of the right middle lobe. This represents a new finding when compared to the prior exam. There is no evidence of a pleural effusion or pneumothorax. Upper Abdomen: No acute abnormality. Musculoskeletal: No chest wall abnormality. No acute or significant osseous findings. Review of the MIP images confirms the above findings. IMPRESSION: 1. A 14.0 cm x 7.8 cm thick walled (approximately 1.3 cm) cavitary lesion within the anterolateral aspect of the mid and lower left lung, increased in size when compared to the prior exam. This is likely consistent with an evolving empyema. 2. Marked severity posterior right basilar atelectasis and/or infiltrate. 3. Mild, slightly nodular appearing right middle lobe infiltrate. This represents a new finding when compared to the prior exam. Electronically Signed   By: TVirgina NorfolkM.D.   On: 09/21/2019 18:31   DG Chest Portable 1 View  Result Date: 09/21/2019 CLINICAL DATA:  Elevated white blood cell count. EXAM: PORTABLE CHEST 1 VIEW COMPARISON:  CT scan Sep 15, 2019 FINDINGS: A rounded collection of air in the lower left hemithorax is at the site of the suspected empyema seen on the CT scan of the chest from Sep 15, 2019. There appears to be more air in the collection today but there is also fluid. Opacity in the left base is identified. No pneumothorax. The right lung is clear. The cardiomediastinal silhouette is otherwise stable. IMPRESSION: 1. Suspected empyema in the left base with more air but persistent fluid. Suspected opacity adjacent to the empyema could be pneumonia or atelectasis. CT imaging could better evaluate. Electronically Signed   By: DDorise BullionIII M.D   On: 09/21/2019 17:01    EKG: Independently reviewed.  Ectopic or atrial tachycardia.  LBBB.  No prior tracing for comparison.  Assessment/Plan Principal Problem:   Empyema (HCC) Active Problems:   [place]   Acute respiratory failure with hypoxemia (HCC)    Chest pain   Hyponatremia   Acute hypoxemic respiratory failure and sepsis secondary to empyema: Does endorse history of alcohol abuse.  Febrile, tachycardic, and tachypneic in the ED.  Patient received 1 L IV fluid bolus.  Tachycardia has now resolved.  Not hypotensive.  Hypoxic to the mid 80s and was placed on 5 L supplemental oxygen.  Currently in the upper 90s on 5 L supplemental oxygen only at rest.  Patient is desatting to the low 80s and becoming tachypneic with minimal exertion such as sitting up in the bed. Labs showing significant leukocytosis with WBC count 21.4.  Lactic acid 2.0.  SARS-CoV-2 PCR test negative.  CT angiogram chest showing a 14 x 7.8 cm thick-walled (  approximately 1.3 cm) cavitary lesion within the anterolateral aspect of the mid and lower left lung, increased in size compared to prior exam.  Findings felt to be consistent with an evolving empyema.  Also showing marked severely posterior right basilar atelectasis and/or infiltrate.  Mild, slightly nodular appearing right middle lobe infiltrate.  This is a new finding compared to the prior exam.   -Continue vancomycin, cefepime, and metronidazole.  Continue IV fluid.  Tylenol as needed for fevers.  Trend lactate.  Blood culture x2 pending.  Incentive spirometry.  Continuous pulse ox, supplemental oxygen.  Check stat ABG.  Patient needs chest tube or catheter drainage of his empyema.  Case was discussed with Dr. Anselm Pancoast from IR who recommended pulmonary medicine consultation.  PCCM has been consulted, appreciate recommendations.  Chest pain: Patient is complaining of constant left lower chest pain for several days.  Suspect this is related to empyema.  EKG with left bundle branch block but no prior tracing available for comparison.  ACS less likely as high-sensitivity troponin checked twice negative. -Continue cardiac monitoring.  Order echocardiogram given left bundle branch block.  Toradol as needed for pain.  Hypovolemic  hyponatremia: Suspect related to decreased p.o. intake/dehydration.  Corrected sodium 127. -Continue IV fluid hydration.  Serial BMPs.  Check serum osmolarity.  Check urine sodium and osmolarity.  Mild hypokalemia: Potassium 3.4. -Replete potassium.  Check magnesium level and replete if low.  Continue to monitor electrolytes.  Mild normocytic anemia: Hemoglobin 11.1, previously normal.  MCV 92.  No signs of active bleeding. -Check iron, ferritin, TIBC  Mild elevation of LFTs: Possibly related to acute illness/sepsis but does endorse history of alcohol abuse.  AST 43, ALT 53, alk phos 134, and T bili 1.5.   -Right upper quadrant ultrasound ordered  Alcohol use disorder -CIWA protocol; Ativan as needed.  Thiamine, folate, and multivitamin.  Tobacco use -NicoDerm patch and patient has been counseled to quit.  HIV screening: The patient falls between the ages of 13-64 and should be screened for HIV, therefore HIV testing ordered.  DVT prophylaxis: SCDs at this time, pending PCCM evaluation Code Status: Full code Family Communication: No family available at this time. Disposition Plan: Status is: Inpatient  Remains inpatient appropriate because:Ongoing active pain requiring inpatient pain management, Ongoing diagnostic testing needed not appropriate for outpatient work up, IV treatments appropriate due to intensity of illness or inability to take PO and Inpatient level of care appropriate due to severity of illness   Dispo: The patient is from: Home              Anticipated d/c is to: SNF              Anticipated d/c date is: > 3 days              Patient currently is not medically stable to d/c.  The medical decision making on this patient was of high complexity and the patient is at high risk for clinical deterioration, therefore this is a level 3 visit.  Shela Leff MD [facility]  If 7PM-7AM, please contact night-coverage [email or web address]  09/22/2019, 12:07 AM

## 2019-09-22 ENCOUNTER — Inpatient Hospital Stay (HOSPITAL_COMMUNITY): Payer: 59

## 2019-09-22 ENCOUNTER — Institutional Professional Consult (permissible substitution): Payer: 59 | Admitting: Internal Medicine

## 2019-09-22 DIAGNOSIS — J9601 Acute respiratory failure with hypoxia: Secondary | ICD-10-CM | POA: Diagnosis present

## 2019-09-22 DIAGNOSIS — A419 Sepsis, unspecified organism: Secondary | ICD-10-CM

## 2019-09-22 DIAGNOSIS — E871 Hypo-osmolality and hyponatremia: Secondary | ICD-10-CM | POA: Diagnosis present

## 2019-09-22 DIAGNOSIS — R079 Chest pain, unspecified: Secondary | ICD-10-CM | POA: Diagnosis present

## 2019-09-22 DIAGNOSIS — J869 Pyothorax without fistula: Secondary | ICD-10-CM

## 2019-09-22 DIAGNOSIS — R9431 Abnormal electrocardiogram [ECG] [EKG]: Secondary | ICD-10-CM

## 2019-09-22 LAB — BASIC METABOLIC PANEL
Anion gap: 7 (ref 5–15)
Anion gap: 6 (ref 5–15)
BUN: 11 mg/dL (ref 6–20)
BUN: 11 mg/dL (ref 6–20)
CO2: 23 mmol/L (ref 22–32)
CO2: 24 mmol/L (ref 22–32)
Calcium: 8.2 mg/dL — ABNORMAL LOW (ref 8.9–10.3)
Calcium: 8.2 mg/dL — ABNORMAL LOW (ref 8.9–10.3)
Chloride: 104 mmol/L (ref 98–111)
Chloride: 105 mmol/L (ref 98–111)
Creatinine, Ser: 0.56 mg/dL — ABNORMAL LOW (ref 0.61–1.24)
Creatinine, Ser: 0.55 mg/dL — ABNORMAL LOW (ref 0.61–1.24)
GFR calc Af Amer: >60 mL/min (ref >60)
GFR calc Af Amer: >60 mL/min (ref >60)
GFR calc non Af Amer: >60 mL/min (ref >60)
GFR calc non Af Amer: >60 mL/min (ref >60)
Glucose, Bld: 211 mg/dL — ABNORMAL HIGH (ref 70–99)
Glucose, Bld: 188 mg/dL — ABNORMAL HIGH (ref 70–99)
Potassium: 4.1 mmol/L (ref 3.5–5.1)
Potassium: 4.4 mmol/L (ref 3.5–5.1)
Sodium: 134 mmol/L — ABNORMAL LOW (ref 135–145)
Sodium: 135 mmol/L (ref 135–145)

## 2019-09-22 LAB — ECHOCARDIOGRAM COMPLETE
Height: 73 in
Weight: 2881.85 oz

## 2019-09-22 LAB — POCT I-STAT 7, (LYTES, BLD GAS, ICA,H+H)
Acid-base deficit: 1 mmol/L (ref 0.0–2.0)
Bicarbonate: 25.6 mmol/L (ref 20.0–28.0)
Calcium, Ion: 1.24 mmol/L (ref 1.15–1.40)
HCT: 30 % — ABNORMAL LOW (ref 39.0–52.0)
Hemoglobin: 10.2 g/dL — ABNORMAL LOW (ref 13.0–17.0)
O2 Saturation: 89 %
Patient temperature: 97.5
Potassium: 4.2 mmol/L (ref 3.5–5.1)
Sodium: 136 mmol/L (ref 135–145)
TCO2: 27 mmol/L (ref 22–32)
pCO2 arterial: 47.3 mmHg (ref 32.0–48.0)
pH, Arterial: 7.338 — ABNORMAL LOW (ref 7.350–7.450)
pO2, Arterial: 58 mmHg — ABNORMAL LOW (ref 83.0–108.0)

## 2019-09-22 LAB — FERRITIN: Ferritin: 2363 ng/mL — ABNORMAL HIGH (ref 24–336)

## 2019-09-22 LAB — PHOSPHORUS
Phosphorus: 3.6 mg/dL (ref 2.5–4.6)
Phosphorus: 3.5 mg/dL (ref 2.5–4.6)
Phosphorus: 3.8 mg/dL (ref 2.5–4.6)

## 2019-09-22 LAB — IRON AND TIBC
Iron: 33 ug/dL — ABNORMAL LOW (ref 45–182)
Saturation Ratios: 21 % (ref 17.9–39.5)
TIBC: 158 ug/dL — ABNORMAL LOW (ref 250–450)
UIBC: 125 ug/dL

## 2019-09-22 LAB — GLUCOSE, CAPILLARY
Glucose-Capillary: 121 mg/dL — ABNORMAL HIGH (ref 70–99)
Glucose-Capillary: 142 mg/dL — ABNORMAL HIGH (ref 70–99)
Glucose-Capillary: 168 mg/dL — ABNORMAL HIGH (ref 70–99)
Glucose-Capillary: 180 mg/dL — ABNORMAL HIGH (ref 70–99)
Glucose-Capillary: 188 mg/dL — ABNORMAL HIGH (ref 70–99)

## 2019-09-22 LAB — MAGNESIUM
Magnesium: 2.1 mg/dL (ref 1.7–2.4)
Magnesium: 2.1 mg/dL (ref 1.7–2.4)
Magnesium: 2.4 mg/dL (ref 1.7–2.4)

## 2019-09-22 LAB — LACTIC ACID, PLASMA: Lactic Acid, Venous: 1.7 mmol/L (ref 0.5–1.9)

## 2019-09-22 LAB — HIV ANTIBODY (ROUTINE TESTING W REFLEX): HIV Screen 4th Generation wRfx: NONREACTIVE

## 2019-09-22 LAB — TRIGLYCERIDES: Triglycerides: 91 mg/dL (ref <150)

## 2019-09-22 LAB — MRSA PCR SCREENING: MRSA by PCR: NEGATIVE

## 2019-09-22 LAB — HEMOGLOBIN A1C
Hgb A1c MFr Bld: 6.3 % — ABNORMAL HIGH (ref 4.8–5.6)
Mean Plasma Glucose: 134.11 mg/dL

## 2019-09-22 MED ORDER — PRO-STAT SUGAR FREE PO LIQD: 30.0000 mL | Freq: Two times a day (BID) | ORAL | Status: DC

## 2019-09-22 MED ORDER — FENTANYL CITRATE (PF) 100 MCG/2ML IJ SOLN
100.0000 ug | Freq: Once | INTRAMUSCULAR | Status: AC
  Administered 2019-09-22: 100 ug via INTRAVENOUS

## 2019-09-22 MED ORDER — PROPOFOL 1000 MG/100ML IV EMUL
INTRAVENOUS | Status: AC
  Filled 2019-09-22: qty 100

## 2019-09-22 MED ORDER — ROCURONIUM BROMIDE 50 MG/5ML IV SOLN
80.0000 mg | Freq: Once | INTRAVENOUS | Status: AC
  Administered 2019-09-22: 80 mg via INTRAVENOUS

## 2019-09-22 MED ORDER — POLYETHYLENE GLYCOL 3350 17 G PO PACK
17.0000 g | PACK | Freq: Every day | ORAL | Status: DC
  Administered 2019-09-23 – 2019-09-26 (×4): 17 g via ORAL
  Filled 2019-09-22 (×5): qty 1

## 2019-09-22 MED ORDER — FENTANYL BOLUS VIA INFUSION
50.0000 ug | INTRAVENOUS | Status: DC | PRN
  Administered 2019-09-22 – 2019-09-23 (×3): 50 ug via INTRAVENOUS
  Filled 2019-09-22: qty 50

## 2019-09-22 MED ORDER — VITAL AF 1.2 CAL PO LIQD
1000.0000 mL | ORAL | Status: DC
  Administered 2019-09-22: 1000 mL

## 2019-09-22 MED ORDER — VITAL HIGH PROTEIN PO LIQD: 1000.0000 mL | ORAL | Status: DC

## 2019-09-22 MED ORDER — PROPOFOL 1000 MG/100ML IV EMUL
0.0000 ug/kg/min | INTRAVENOUS | Status: DC
  Administered 2019-09-22 – 2019-09-23 (×5): 25 ug/kg/min via INTRAVENOUS
  Filled 2019-09-22 (×4): qty 100

## 2019-09-22 MED ORDER — PANTOPRAZOLE SODIUM 40 MG IV SOLR
40.0000 mg | INTRAVENOUS | Status: DC
  Administered 2019-09-22 – 2019-09-24 (×3): 40 mg via INTRAVENOUS
  Filled 2019-09-22 (×3): qty 40

## 2019-09-22 MED ORDER — FENTANYL 2500MCG IN NS 250ML (10MCG/ML) PREMIX INFUSION
50.0000 ug/h | INTRAVENOUS | Status: DC
  Administered 2019-09-22 – 2019-09-23 (×2): 100 ug/h via INTRAVENOUS
  Filled 2019-09-22 (×2): qty 250

## 2019-09-22 MED ORDER — PRO-STAT SUGAR FREE PO LIQD
30.0000 mL | Freq: Every day | ORAL | Status: DC
  Administered 2019-09-22: 30 mL
  Filled 2019-09-22: qty 30

## 2019-09-22 MED ORDER — CHLORHEXIDINE GLUCONATE 0.12% ORAL RINSE (MEDLINE KIT)
15.0000 mL | Freq: Two times a day (BID) | OROMUCOSAL | Status: DC
  Administered 2019-09-22 – 2019-09-25 (×6): 15 mL via OROMUCOSAL

## 2019-09-22 MED ORDER — ETOMIDATE 2 MG/ML IV SOLN
40.0000 mg | Freq: Once | INTRAVENOUS | Status: AC
  Administered 2019-09-22: 40 mg via INTRAVENOUS

## 2019-09-22 MED ORDER — CHLORHEXIDINE GLUCONATE CLOTH 2 % EX PADS
6.0000 | MEDICATED_PAD | Freq: Every day | CUTANEOUS | Status: DC
  Administered 2019-09-23 – 2019-09-26 (×4): 6 via TOPICAL

## 2019-09-22 MED ORDER — MIDAZOLAM HCL 2 MG/2ML IJ SOLN
2.0000 mg | Freq: Once | INTRAMUSCULAR | Status: AC
  Administered 2019-09-22: 2 mg via INTRAVENOUS

## 2019-09-22 MED ORDER — ENOXAPARIN SODIUM 40 MG/0.4ML ~~LOC~~ SOLN
40.0000 mg | SUBCUTANEOUS | Status: DC
  Administered 2019-09-22 – 2019-09-26 (×5): 40 mg via SUBCUTANEOUS
  Filled 2019-09-22 (×6): qty 0.4

## 2019-09-22 MED ORDER — OLANZAPINE 10 MG IM SOLR
5.0000 mg | Freq: Once | INTRAMUSCULAR | Status: DC
  Filled 2019-09-22 (×2): qty 10

## 2019-09-22 MED ORDER — FENTANYL CITRATE (PF) 100 MCG/2ML IJ SOLN: 50.0000 ug | Freq: Once | INTRAMUSCULAR | Status: DC

## 2019-09-22 MED ORDER — DOCUSATE SODIUM 50 MG/5ML PO LIQD
100.0000 mg | Freq: Two times a day (BID) | ORAL | Status: DC
  Administered 2019-09-22 – 2019-09-25 (×5): 100 mg via ORAL
  Filled 2019-09-22 (×10): qty 10

## 2019-09-22 MED ORDER — ORAL CARE MOUTH RINSE
15.0000 mL | OROMUCOSAL | Status: DC
  Administered 2019-09-22 – 2019-09-23 (×11): 15 mL via OROMUCOSAL

## 2019-09-22 MED ORDER — INSULIN ASPART 100 UNIT/ML ~~LOC~~ SOLN
0.0000 [IU] | SUBCUTANEOUS | Status: DC
  Administered 2019-09-22: 1 [IU] via SUBCUTANEOUS
  Administered 2019-09-22 (×4): 2 [IU] via SUBCUTANEOUS
  Administered 2019-09-22 – 2019-09-23 (×2): 1 [IU] via SUBCUTANEOUS
  Administered 2019-09-23 (×2): 2 [IU] via SUBCUTANEOUS
  Administered 2019-09-25: 1 [IU] via SUBCUTANEOUS

## 2019-09-22 NOTE — Consult Note (Signed)
[address]       [place] [id number]             [phone]      Cardiothoracic Surgery Consultation  Reason for Consult: Left empyema Referring Physician: Ina Homes, MD  Victor Torres is an 49 y.o. male.  HPI:   The patient is a 49 year old gentleman with history of ethanol and tobacco abuse who presented with about a 1 week history of progressive shortness of breath, fever, chills, and cough productive of purulent sputum.  He was initially seen at an urgent care on 09/15/2019 and reportedly had an outpatient CT scan showing possible empyema.  He was scheduled for a follow-up appointment but became progressively more symptomatic and presented last night to the ER.  He had a temperature of 103 and a white blood cell count of 21.4.  CTA of the chest showed a 14 x 8 cm thick-walled cavitary collection in the left lower lateral chest which had increased in size compared to his prior exam.  There was significant atelectasis/infiltrate in the right lung base.  He was started on antibiotics but continued to worsen was transferred to the ICU for intubation.  He had a pigtail catheter placed in the left chest fluid collection by CCM under ultrasound guidance.  This is drained about 300 cc of grossly purulent fluid.  Past Medical History:  Diagnosis Date  . Alcohol abuse     History reviewed. No pertinent surgical history.  History reviewed. No pertinent family history.  Social History:  reports that he has been smoking cigarettes. He has never used smokeless tobacco. He reports current alcohol use. He reports previous drug use. Drug: Cocaine.  Allergies: No Known Allergies  Medications:  I have reviewed the patient's current medications. Prior to Admission:  Medications Prior to Admission  Medication Sig Dispense Refill Last Dose  . meclizine (ANTIVERT) 25 MG tablet Take 1 tablet (25 mg total) by mouth 3 (three) times daily as needed for dizziness. 12 tablet 0     Scheduled: . chlorhexidine gluconate (MEDLINE KIT)  15 mL Mouth Rinse BID  . Chlorhexidine Gluconate Cloth  6 each Topical Daily  . docusate  100 mg Oral BID  . enoxaparin (LOVENOX) injection  40 mg Subcutaneous Q24H  . fentaNYL (SUBLIMAZE) injection  50 mcg Intravenous Once  . folic acid  1 mg Oral Daily  . insulin aspart  0-9 Units Subcutaneous Q4H  . mouth rinse  15 mL Mouth Rinse 10 times per day  . multivitamin with minerals  1 tablet Oral Daily  . pantoprazole (PROTONIX) IV  40 mg Intravenous Q24H  . polyethylene glycol  17 g Oral Daily  . thiamine  100 mg Oral Daily   Or  . thiamine  100 mg Intravenous Daily   Continuous: . sodium chloride 125 mL/hr at 09/22/19 0800  . ceFEPime (MAXIPIME) IV Stopped (09/22/19 0029)  . fentaNYL infusion INTRAVENOUS 100 mcg/hr (09/22/19 0800)  . metronidazole 500 mg (09/22/19 0821)  . propofol (DIPRIVAN) infusion 25 mcg/kg/min (09/22/19 0929)  . vancomycin 1,000 mg (09/22/19 0927)   MWN:[id number] **OR** acetaminophen, fentaNYL, ketorolac, LORazepam **OR** LORazepam Anti-infectives (From admission, onward)   Start     Dose/Rate Route Frequency Ordered Stop   09/22/19 0200  vancomycin (VANCOCIN) IVPB 1000 mg/200 mL premix     1,000 mg 200 mL/hr over 60 Minutes Intravenous Every 8 hours 09/21/19 1809     09/22/19 0000  ceFEPIme (MAXIPIME) 2 g  in sodium chloride 0.9 % 100 mL IVPB     2 g 200 mL/hr over 30 Minutes Intravenous Every 8 hours 09/21/19 1809     09/22/19 0000  metroNIDAZOLE (FLAGYL) IVPB 500 mg     500 mg 100 mL/hr over 60 Minutes Intravenous Every 8 hours 09/21/19 2238     09/21/19 1715  vancomycin (VANCOCIN) IVPB 1000 mg/200 mL premix     1,000 mg 200 mL/hr over 60 Minutes Intravenous Every 1 hr x 2 09/21/19 1617 09/21/19 1914   09/21/19 1615  ceFEPIme (MAXIPIME) 2 g in sodium chloride 0.9 % 100 mL IVPB     2 g 200 mL/hr over 30 Minutes Intravenous  Once 09/21/19 1613 09/21/19 1640   09/21/19 1615  metroNIDAZOLE (FLAGYL)  IVPB 500 mg     500 mg 100 mL/hr over 60 Minutes Intravenous  Once 09/21/19 1613 09/21/19 1705   09/21/19 1615  vancomycin (VANCOCIN) IVPB 1000 mg/200 mL premix  Status:  Discontinued     1,000 mg 200 mL/hr over 60 Minutes Intravenous  Once 09/21/19 1613 09/21/19 1617      Results for orders placed or performed during the hospital encounter of 09/21/19 (from the past 48 hour(s))  Lactic acid, plasma     Status: Abnormal   Collection Time: 09/21/19  4:18 PM  Result Value Ref Range   Lactic Acid, Venous 2.0 (HH) 0.5 - 1.9 mmol/L    Comment: CRITICAL RESULT CALLED TO, READ BACK BY AND VERIFIED WITH: Laurena Slimmer RN [phone] PHILLIPS C Performed at [facility], [address] [place], [place] [address]   Comprehensive metabolic panel     Status: Abnormal   Collection Time: 09/21/19  4:18 PM  Result Value Ref Range   Sodium 126 (L) 135 - 145 mmol/L   Potassium 3.4 (L) 3.5 - 5.1 mmol/L   Chloride 91 (L) 98 - 111 mmol/L   CO2 22 22 - 32 mmol/L   Glucose, Bld 180 (H) 70 - 99 mg/dL    Comment: Glucose reference range applies only to samples taken after fasting for at least 8 hours.   BUN 11 6 - 20 mg/dL   Creatinine, Ser 0.75 0.61 - 1.24 mg/dL   Calcium 7.8 (L) 8.9 - 10.3 mg/dL   Total Protein 7.8 6.5 - 8.1 g/dL   Albumin 2.2 (L) 3.5 - 5.0 g/dL   AST 43 (H) 15 - 41 U/L   ALT 53 (H) 0 - 44 U/L   Alkaline Phosphatase 134 (H) 38 - 126 U/L   Total Bilirubin 1.5 (H) 0.3 - 1.2 mg/dL   GFR calc non Af Amer >60 >60 mL/min   GFR calc Af Amer >60 >60 mL/min   Anion gap 13 5 - 15    Comment: Performed at [facility], [address] [place], [place] [address]  CBC WITH DIFFERENTIAL     Status: Abnormal   Collection Time: 09/21/19  4:18 PM  Result Value Ref Range   WBC 21.4 (H) 4.0 - 10.5 K/uL   RBC 3.42 (L) 4.22 - 5.81 MIL/uL   Hemoglobin 11.1 (L) 13.0 - 17.0 g/dL   HCT 31.7 (L) 39.0 - 52.0 %   MCV 92.7 80.0 - 100.0 fL   MCH 32.5 26.0 - 34.0 pg   MCHC 35.0 30.0 -  36.0 g/dL   RDW 13.4 11.5 - 15.5 %   Platelets 406 (H) 150 - 400 K/uL   nRBC 0.0 0.0 -  0.2 %   Neutrophils Relative % 86 %   Neutro Abs 18.4 (H) 1.7 - 7.7 K/uL   Lymphocytes Relative 5 %   Lymphs Abs 1.2 0.7 - 4.0 K/uL   Monocytes Relative 8 %   Monocytes Absolute 1.7 (H) 0.1 - 1.0 K/uL   Eosinophils Relative 0 %   Eosinophils Absolute 0.0 0.0 - 0.5 K/uL   Basophils Relative 0 %   Basophils Absolute 0.1 0.0 - 0.1 K/uL   Immature Granulocytes 1 %   Abs Immature Granulocytes 0.15 (H) 0.00 - 0.07 K/uL    Comment: Performed at [facility], [address] [place], [place] [address]  APTT     Status: Abnormal   Collection Time: 09/21/19  4:18 PM  Result Value Ref Range   aPTT 37 (H) 24 - 36 seconds    Comment:        IF BASELINE aPTT IS ELEVATED, SUGGEST PATIENT RISK ASSESSMENT BE USED TO DETERMINE APPROPRIATE ANTICOAGULANT THERAPY. Performed at [facility], [address] [place], [place] [address]   Protime-INR     Status: Abnormal   Collection Time: 09/21/19  4:18 PM  Result Value Ref Range   Prothrombin Time 16.4 (H) 11.4 - 15.2 seconds   INR 1.4 (H) 0.8 - 1.2    Comment: (NOTE) INR goal varies based on device and disease states. Performed at [facility], [address] [place], [place] [address]   Blood Culture (routine x 2)     Status: None (Preliminary result)   Collection Time: 09/21/19  4:19 PM   Specimen: BLOOD  Result Value Ref Range   Specimen Description      BLOOD LEFT ANTECUBITAL Performed at [facility], [address] [place], [place] [address]    Special Requests      BOTTLES DRAWN AEROBIC AND ANAEROBIC Blood Culture adequate volume Performed at [facility], [address] [place], [place] [address]    Culture      NO GROWTH < 12 HOURS Performed at [facility], [address] [place], [place] [address]    Report Status PENDING   Urinalysis, Routine w reflex microscopic      Status: Abnormal   Collection Time: 09/21/19  4:19 PM  Result Value Ref Range   Color, Urine YELLOW YELLOW   APPearance CLEAR CLEAR   Specific Gravity, Urine <1.005 (L) 1.005 - 1.030   pH 6.0 5.0 - 8.0   Glucose, UA NEGATIVE NEGATIVE mg/dL   Hgb urine dipstick TRACE (A) NEGATIVE   Bilirubin Urine NEGATIVE NEGATIVE   Ketones, ur NEGATIVE NEGATIVE mg/dL   Protein, ur NEGATIVE NEGATIVE mg/dL   Nitrite NEGATIVE NEGATIVE   Leukocytes,Ua NEGATIVE NEGATIVE    Comment: Performed at [facility], [address] [place], [place] [address]  Troponin I (High Sensitivity)     Status: None   Collection Time: 09/21/19  4:19 PM  Result Value Ref Range   Troponin I (High Sensitivity) 8 <18 ng/L    Comment: (NOTE) Elevated high sensitivity troponin I (hsTnI) values and significant  changes across serial measurements may suggest ACS but many other  chronic and acute conditions are known to elevate hsTnI results.  Refer to the "Links" section for chest pain algorithms and additional  guidance. Performed at [facility], [address] [place], [place] [address]   Urinalysis, Microscopic (reflex)  Status: Abnormal   Collection Time: 09/21/19  4:19 PM  Result Value Ref Range   RBC / HPF 0-5 0 - 5 RBC/hpf   WBC, UA NONE SEEN 0 - 5 WBC/hpf   Bacteria, UA RARE (A) NONE SEEN   Squamous Epithelial / LPF 0-5 0 - 5    Comment: Performed at [facility], [address] [place], [place] [address]  Blood Culture (routine x 2)     Status: None (Preliminary result)   Collection Time: 09/21/19  4:20 PM   Specimen: BLOOD  Result Value Ref Range   Specimen Description      BLOOD BLOOD RIGHT FOREARM Performed at [facility], [address] [place], [place] [address]    Special Requests      BOTTLES DRAWN AEROBIC AND ANAEROBIC Blood Culture adequate volume Performed at [facility], [address] [place], [place] [address]    Culture      NO  GROWTH < 12 HOURS Performed at [facility], [address] [place], [place] [address]    Report Status PENDING   SARS Coronavirus 2 by RT PCR (hospital order, performed in [facility] hospital lab) Nasopharyngeal Nasopharyngeal Swab     Status: None   Collection Time: 09/21/19  4:20 PM   Specimen: Nasopharyngeal Swab  Result Value Ref Range   SARS Coronavirus 2 NEGATIVE NEGATIVE    Comment: (NOTE) SARS-CoV-2 target nucleic acids are NOT DETECTED. The SARS-CoV-2 RNA is generally detectable in upper and lower respiratory specimens during the acute phase of infection. The lowest concentration of SARS-CoV-2 viral copies this assay can detect is 250 copies / mL. A negative result does not preclude SARS-CoV-2 infection and should not be used as the sole basis for treatment or other patient management decisions.  A negative result may occur with improper specimen collection / handling, submission of specimen other than nasopharyngeal swab, presence of viral mutation(s) within the areas targeted by this assay, and inadequate number of viral copies (<250 copies / mL). A negative result must be combined with clinical observations, patient history, and epidemiological information. Fact Sheet for Patients:   StrictlyIdeas.no Fact Sheet for Healthcare Providers: BankingDealers.co.za This test is not yet approved or cleared  by the [place] FDA and has been authorized for detection and/or diagnosis of SARS-CoV-2 by FDA under an Emergency Use Authorization (EUA).  This EUA will remain in effect (meaning this test can be used) for the duration of the COVID-19 declaration under Section 564(b)(1) of the Act, 21 U.S.C. section 360bbb-3(b)(1), unless the authorization is terminated or revoked sooner. Performed at [facility], [address] [place], [place] [address]   Troponin I (High Sensitivity)     Status: None   Collection  Time: 09/21/19  7:20 PM  Result Value Ref Range   Troponin I (High Sensitivity) 8 <18 ng/L    Comment: (NOTE) Elevated high sensitivity troponin I (hsTnI) values and significant  changes across serial measurements may suggest ACS but many other  chronic and acute conditions are known to elevate hsTnI results.  Refer to the "Links" section for chest pain algorithms and additional  guidance. Performed at [facility], [address] [place], [place] [address]   Blood gas, arterial     Status: Abnormal   Collection Time: 09/21/19 11:10 PM  Result Value Ref Range   FIO2 40.00    pH, Arterial 7.415 7.350 -  7.450   pCO2 arterial 36.2 32.0 - 48.0 mmHg   pO2, Arterial 78.8 (L) 83.0 - 108.0 mmHg   Bicarbonate 22.9 20.0 - 28.0 mmol/L   Acid-base deficit 1.1 0.0 - 2.0 mmol/L   O2 Saturation 95.2 %   Patient temperature 36.5    Collection site LEFT RADIAL    Drawn by [id number]    Sample type ARTERIAL    Allens test (pass/fail) PASS PASS    Comment: Performed at [facility], [place] [address], [place], [place] [address]  Lactic acid, plasma     Status: None   Collection Time: 09/22/19  1:28 AM  Result Value Ref Range   Lactic Acid, Venous 1.7 0.5 - 1.9 mmol/L    Comment: Performed at [facility] [address] [place], [place] [address]  MRSA PCR Screening     Status: None   Collection Time: 09/22/19  2:47 AM   Specimen: Nasopharyngeal  Result Value Ref Range   MRSA by PCR NEGATIVE NEGATIVE    Comment:        The GeneXpert MRSA Assay (FDA approved for NASAL specimens only), is one component of a comprehensive MRSA colonization surveillance program. It is not intended to diagnose MRSA infection nor to guide or monitor treatment for MRSA infections. Performed at [facility], [place] [address], [place], [place] [address]   Body fluid culture     Status: None (Preliminary result)   Collection Time: 09/22/19  4:20 AM   Specimen: Body Fluid  Result Value Ref  Range   Specimen Description FLUID EMPYEMA    Special Requests NONE    Gram Stain      RARE WBC PRESENT, PREDOMINANTLY PMN ABUNDANT GRAM NEGATIVE RODS FEW GRAM POSITIVE COCCI RARE GRAM POSITIVE RODS Performed at [facility], [place] [address] [place], [place] [address]    Culture PENDING    Report Status PENDING   I-STAT 7, (LYTES, BLD GAS, ICA, H+H)     Status: Abnormal   Collection Time: 09/22/19  5:37 AM  Result Value Ref Range   pH, Arterial 7.338 (L) 7.350 - 7.450   pCO2 arterial 47.3 32.0 - 48.0 mmHg   pO2, Arterial 58 (L) 83.0 - 108.0 mmHg   Bicarbonate 25.6 20.0 - 28.0 mmol/L   TCO2 27 22 - 32 mmol/L   O2 Saturation 89.0 %   Acid-base deficit 1.0 0.0 - 2.0 mmol/L   Sodium 136 135 - 145 mmol/L   Potassium 4.2 3.5 - 5.1 mmol/L   Calcium, Ion 1.24 1.15 - 1.40 mmol/L   HCT 30.0 (L) 39.0 - 52.0 %   Hemoglobin 10.2 (L) 13.0 - 17.0 g/dL   Patient temperature 97.5 F    Collection site Brachial    Drawn by RT    Sample type ARTERIAL   Iron and TIBC     Status: Abnormal   Collection Time: 09/22/19  6:07 AM  Result Value Ref Range   Iron 33 (L) 45 - 182 ug/dL   TIBC 158 (L) 250 - 450 ug/dL   Saturation Ratios 21 17.9 - 39.5 %   UIBC 125 ug/dL    Comment: Performed at [facility] [address] [place], [place] [address]  Ferritin     Status: Abnormal   Collection Time: 09/22/19  6:07 AM  Result Value Ref Range   Ferritin 2,363 (H) 24 - 336 ng/mL    Comment: Performed at [facility], [address] [place], [place] [address]  Basic metabolic panel     Status: Abnormal   Collection Time: 09/22/19  6:07 AM  Result Value Ref Range   Sodium 134 (L) 135 - 145 mmol/L   Potassium 4.1 3.5 - 5.1 mmol/L   Chloride 104 98 - 111 mmol/L   CO2 23 22 - 32 mmol/L   Glucose, Bld 211 (H) 70 - 99 mg/dL    Comment: Glucose reference range applies only to samples taken after fasting for at least 8 hours.   BUN 11 6 - 20 mg/dL   Creatinine, Ser 0.56 (L) 0.61 - 1.24  mg/dL   Calcium 8.2 (L) 8.9 - 10.3 mg/dL   GFR calc non Af Amer >60 >60 mL/min   GFR calc Af Amer >60 >60 mL/min   Anion gap 7 5 - 15    Comment: Performed at [facility] [address], [place], [place] [address]  Magnesium     Status: None   Collection Time: 09/22/19  6:07 AM  Result Value Ref Range   Magnesium 2.1 1.7 - 2.4 mg/dL    Comment: Performed at [facility] [address] [place], [place] [address]  Phosphorus     Status: None   Collection Time: 09/22/19  6:07 AM  Result Value Ref Range   Phosphorus 3.6 2.5 - 4.6 mg/dL    Comment: Performed at [facility] [address] [place], [place] [address]  Triglycerides     Status: None   Collection Time: 09/22/19  6:07 AM  Result Value Ref Range   Triglycerides 91 <150 mg/dL    Comment: Performed at [facility] [address], [place], [place] [address]  Hemoglobin A1c     Status: Abnormal   Collection Time: 09/22/19  6:07 AM  Result Value Ref Range   Hgb A1c MFr Bld 6.3 (H) 4.8 - 5.6 %    Comment: (NOTE) Pre diabetes:          5.7%-6.4% Diabetes:              >6.4% Glycemic control for   <7.0% adults with diabetes    Mean Plasma Glucose 134.11 mg/dL    Comment: Performed at [facility] [address] [place], [place] [address]  Glucose, capillary     Status: Abnormal   Collection Time: 09/22/19  6:16 AM  Result Value Ref Range   Glucose-Capillary 180 (H) 70 - 99 mg/dL    Comment: Glucose reference range applies only to samples taken after fasting for at least 8 hours.    CT Angio Chest PE W and/or Wo Contrast  Result Date: 09/21/2019 CLINICAL DATA:  Shortness of breath. EXAM: CT ANGIOGRAPHY CHEST WITH CONTRAST TECHNIQUE: Multidetector CT imaging of the chest was performed using the standard protocol during bolus administration of intravenous contrast. Multiplanar CT image reconstructions and MIPs were obtained to evaluate the vascular anatomy. CONTRAST:  180m OMNIPAQUE IOHEXOL  350 MG/ML SOLN COMPARISON:  None. FINDINGS: Cardiovascular: The subsegmental pulmonary arteries are limited in evaluation secondary to suboptimal opacification with intravenous contrast. This is most notable within the bilateral lung bases. No intraluminal filling defects are clearly identified. Normal heart size. No pericardial effusion. Mediastinum/Nodes: There is mild precarinal lymphadenopathy. Thyroid gland, trachea, and esophagus demonstrate no significant findings. Lungs/Pleura: A 14.0 cm x 7.8 cm thick walled (approximately 1.3 cm) cavitary lesion is seen within the anterolateral aspect of the mid and lower left  lung. This is increased in size when compared to the prior exam. A single large air-fluid level is seen. A marked amount of adjacent posterior atelectasis and/or infiltrate is seen within the right lung base. This is increased in severity when compared to the prior study. Mild, slightly nodular appearing infiltrate is seen within the inferior aspect of the right middle lobe. This represents a new finding when compared to the prior exam. There is no evidence of a pleural effusion or pneumothorax. Upper Abdomen: No acute abnormality. Musculoskeletal: No chest wall abnormality. No acute or significant osseous findings. Review of the MIP images confirms the above findings. IMPRESSION: 1. A 14.0 cm x 7.8 cm thick walled (approximately 1.3 cm) cavitary lesion within the anterolateral aspect of the mid and lower left lung, increased in size when compared to the prior exam. This is likely consistent with an evolving empyema. 2. Marked severity posterior right basilar atelectasis and/or infiltrate. 3. Mild, slightly nodular appearing right middle lobe infiltrate. This represents a new finding when compared to the prior exam. Electronically Signed   By: Virgina Norfolk M.D.   On: 09/21/2019 18:31   DG CHEST PORT 1 VIEW  Result Date: 09/22/2019 CLINICAL DATA:  Empyema, ET placement, OG tube, left chest  tube placement EXAM: PORTABLE CHEST 1 VIEW COMPARISON:  CT 09/21/2019, radiograph 09/21/2019 FINDINGS: *Endotracheal tube is position in the mid to upper trachea, 7.4 cm from the carina. *Transesophageal tube tip and side port are below the level of imaging, likely beyond the GE junction. *Pigtail pleural drain is seen along the left pleural in the vicinity of the complex empyema seen on comparison cross-sectional imaging. Significant interval decrease in size of the air and fluid containing pleural collection in the left lung with some residual basilar opacity likely reflecting combination of residual pleural disease, consolidation and atelectasis. The right lung is predominantly clear. No right pneumothorax or pleural fluid. Cardiac contours are grossly unchanged accounting for opacity obscuring portion of the left heart border. No acute osseous or soft tissue abnormality. IMPRESSION: Pigtail pleural drain along the left pleural likely within the superior extent of the complex empyema seen on comparison cross-sectional imaging. Significant interval decrease in size of the air and fluid containing pleural collection in the left lung with some residual basilar opacity likely reflecting combination of residual pleural disease, consolidation and atelectasis. Electronically Signed   By: Lovena Le M.D.   On: 09/22/2019 04:46   DG Chest Portable 1 View  Result Date: 09/21/2019 CLINICAL DATA:  Elevated white blood cell count. EXAM: PORTABLE CHEST 1 VIEW COMPARISON:  CT scan Sep 15, 2019 FINDINGS: A rounded collection of air in the lower left hemithorax is at the site of the suspected empyema seen on the CT scan of the chest from Sep 15, 2019. There appears to be more air in the collection today but there is also fluid. Opacity in the left base is identified. No pneumothorax. The right lung is clear. The cardiomediastinal silhouette is otherwise stable. IMPRESSION: 1. Suspected empyema in the left base with more air  but persistent fluid. Suspected opacity adjacent to the empyema could be pneumonia or atelectasis. CT imaging could better evaluate. Electronically Signed   By: Dorise Bullion III M.D   On: 09/21/2019 17:01   US Abdomen Limited RUQ  Result Date: 09/22/2019 CLINICAL DATA:  Elevated LFTs EXAM: ULTRASOUND ABDOMEN LIMITED RIGHT UPPER QUADRANT COMPARISON:  None available FINDINGS: Gallbladder: No gallstones or wall thickening visualized. Murphy sign cannot be assessed due  to patient condition. Common bile duct: Diameter: 5-6 mm.  Where visualized, no filling defect. Liver: No focal lesion identified. Within normal limits in parenchymal echogenicity. Portal vein is patent on color Doppler imaging with normal direction of blood flow towards the liver. IMPRESSION: Negative right upper quadrant ultrasound Electronically Signed   By: Monte Fantasia M.D.   On: 09/22/2019 09:40    Review of Systems  Unable to perform ROS: Intubated   Blood pressure (!) 118/45, pulse 63, temperature 97.6 F (36.4 C), temperature source Oral, resp. rate (!) 22, height 6' 1" (1.854 m), weight 81.7 kg, SpO2 100 %. Physical Exam  Cardiovascular: Normal rate, regular rhythm, normal heart sounds and intact distal pulses.  No murmur heard. Respiratory: Breath sounds normal.  GI: Soft. He exhibits no distension.   CLINICAL DATA:  Shortness of breath.  EXAM: CT ANGIOGRAPHY CHEST WITH CONTRAST  TECHNIQUE: Multidetector CT imaging of the chest was performed using the standard protocol during bolus administration of intravenous contrast. Multiplanar CT image reconstructions and MIPs were obtained to evaluate the vascular anatomy.  CONTRAST:  141m OMNIPAQUE IOHEXOL 350 MG/ML SOLN  COMPARISON:  None.  FINDINGS: Cardiovascular: The subsegmental pulmonary arteries are limited in evaluation secondary to suboptimal opacification with intravenous contrast. This is most notable within the bilateral lung bases. No  intraluminal filling defects are clearly identified. Normal heart size. No pericardial effusion.  Mediastinum/Nodes: There is mild precarinal lymphadenopathy. Thyroid gland, trachea, and esophagus demonstrate no significant findings.  Lungs/Pleura: A 14.0 cm x 7.8 cm thick walled (approximately 1.3 cm) cavitary lesion is seen within the anterolateral aspect of the mid and lower left lung. This is increased in size when compared to the prior exam. A single large air-fluid level is seen. A marked amount of adjacent posterior atelectasis and/or infiltrate is seen within the right lung base. This is increased in severity when compared to the prior study.  Mild, slightly nodular appearing infiltrate is seen within the inferior aspect of the right middle lobe. This represents a new finding when compared to the prior exam.  There is no evidence of a pleural effusion or pneumothorax.  Upper Abdomen: No acute abnormality.  Musculoskeletal: No chest wall abnormality. No acute or significant osseous findings.  Review of the MIP images confirms the above findings.  IMPRESSION: 1. A 14.0 cm x 7.8 cm thick walled (approximately 1.3 cm) cavitary lesion within the anterolateral aspect of the mid and lower left lung, increased in size when compared to the prior exam. This is likely consistent with an evolving empyema. 2. Marked severity posterior right basilar atelectasis and/or infiltrate. 3. Mild, slightly nodular appearing right middle lobe infiltrate. This represents a new finding when compared to the prior exam.   Electronically Signed   By: TVirgina NorfolkM.D.   On: 09/21/2019 18:31   CLINICAL DATA:  Empyema, ET placement, OG tube, left chest tube placement  EXAM: PORTABLE CHEST 1 VIEW  COMPARISON:  CT 09/21/2019, radiograph 09/21/2019  FINDINGS: *Endotracheal tube is position in the mid to upper trachea, 7.4 cm from the carina. *Transesophageal tube tip  and side port are below the level of imaging, likely beyond the GE junction. *Pigtail pleural drain is seen along the left pleural in the vicinity of the complex empyema seen on comparison cross-sectional imaging.  Significant interval decrease in size of the air and fluid containing pleural collection in the left lung with some residual basilar opacity likely reflecting combination of residual pleural disease, consolidation and atelectasis. The  right lung is predominantly clear. No right pneumothorax or pleural fluid. Cardiac contours are grossly unchanged accounting for opacity obscuring portion of the left heart border. No acute osseous or soft tissue abnormality.  IMPRESSION: Pigtail pleural drain along the left pleural likely within the superior extent of the complex empyema seen on comparison cross-sectional imaging. Significant interval decrease in size of the air and fluid containing pleural collection in the left lung with some residual basilar opacity likely reflecting combination of residual pleural disease, consolidation and atelectasis.   Electronically Signed   By: Lovena Le M.D.   On: 09/22/2019 04:46   Assessment/Plan:  This 49 year old gentleman with history of tobacco and alcohol abuse has a left pleural empyema with frank pus that was drained by the pigtail catheter.  CT of the chest showed that this was a single large collection with surrounding infiltrate/compressive atelectasis of the left lung base.  The pigtail catheter is in good position on chest x-ray and there is been a significant decrease in the fluid collection on follow-up chest x-ray.  I do not think surgical drainage is indicated at this time and there is a reasonable chance that the pigtail catheter may be adequate treatment for this in addition to antibiotics.  I would recommend flushing the pigtail catheter as needed to prevent occlusion.  I will follow his progress and as long as his chest  x-ray continues to improve I do not think surgical intervention will be needed.  I discussed this with his mother who is at the bedside.  Fernande Boyden Bartle 09/22/2019, 10:30 AM

## 2019-09-22 NOTE — Progress Notes (Signed)
Rt called to pt's room d/t desat and increased O2 needs.  Rt arrived to find pt slumped over in bed and not as arousable as when I had seen him around 2300 (09/21/19).  Pt looked dazed and unable to answer questions.  Pt was then on 14LPM [place] and sat was 94%.  RT was concerned about pt need to possibly move to ICU, but RN stated MD had just left and placed a note stating pt needed to go to IR for chest tube.  RT called Rapid Response, Norva Riffle, to see pt d/t his new condition.  Onalee Hua joined [place] within a couple mins and within those mins, pt had crawled over the bed rails and was standing beside the bed.  After much coaching, pt sat on side of bed but was still unable to hold himself up from falling.  Pt's color was drained and he appeared very gray.  Once pt was back in sitting position in the bed, color returned to pt's face but he was still not pink.  Pt sat back and VS were WNR.  Pt still looking confused and unable to support himself.  RT placed NRB in room in case he needed it.  RT will continue to monitor.

## 2019-09-22 NOTE — Procedures (Addendum)
Procedure: Left chest tube Diagnosis: Empyema  Due to emergency and under aseptic technique and full barrier size 14 French chest tube was inserted after localization with a small needle and ultrasound use there was clear Foley pus coming out of the chest. Chest tube was inserted smoothly with no complications. Chest x-ray was ordered and confirmed position. Chest tube continued to drain pus.

## 2019-09-22 NOTE — Progress Notes (Signed)
Case reviewed, patient seen, Will ask TCTS if they are willing to do VAT since already on vent.  Myrla Halsted MD PCCM

## 2019-09-22 NOTE — Progress Notes (Signed)
RT obtained ABG on pt with the following results while on settings of PRVC 630/15/60%/+5. CCM PA Gleason notified of ABG results. Increase FIO2 and increase rate from 15 to 18 per verbal order. RT will continue to monitor.   Results for Victor Torres, Victor Torres (MRN [id number]) as of 09/22/2019 05:41  Ref. Range 09/22/2019 05:37  Sample type Unknown ARTERIAL  pH, Arterial Latest Ref Range: 7.350 - 7.450  7.338 (L)  pCO2 arterial Latest Ref Range: 32.0 - 48.0 mmHg 47.3  pO2, Arterial Latest Ref Range: 83.0 - 108.0 mmHg 58 (L)  TCO2 Latest Ref Range: 22 - 32 mmol/L 27  Acid-base deficit Latest Ref Range: 0.0 - 2.0 mmol/L 1.0  Bicarbonate Latest Ref Range: 20.0 - 28.0 mmol/L 25.6  O2 Saturation Latest Units: % 89.0  Patient temperature Unknown 97.5 F  Collection site Unknown Brachial

## 2019-09-22 NOTE — Consult Note (Signed)
NAME:  Victor Torres, MRN:  [id number], DOB:  [birth year], LOS: 1 ADMISSION DATE:  09/21/2019, CONSULTATION DATE:  09/22/19 REFERRING MD:  Hospitalist, CHIEF COMPLAINT:  empyema   Brief History   49 y.o. M with PMH of ETOH and tobacco abuse who developed cough and dysnpea approximately one week ago, admitted with L-sided empyema and required transfer to ICU, intubation and chest tube.  History of present illness   49 y.o. M with PMH of ETOH and tobacco abuse who presented to [place] ED with shortness of breath.   He initially went to urgent care on 5/17 and had an outpatient CT showing possible empyema, he was scheduled for a follow up appointment, but became increasingly SOB, so presented to the ED with fever, chills and cough productive of purulent sputum.  He was requiring 5L [place] oxygen and CTA significant for 14x7.8cm thick walled cavitary lesion in the Mid-lower L lung. He was treated with Vanc, cefepime, flagyl and Decadron.  IR consulted for chest tube, asked that PCCM evaluate overnight.    He continued to worsen and required transfer to the ICU, intubation and placement of chest tube.  Past Medical History   has a past medical history of Alcohol abuse., per notes he quit one year ago, but did three shots before presenting to the ED.   Significant Hospital Events   5/23 admitted to hospitalists 5/24 PCCM consult, transfer to ICU and intubation  Consults:  PCCM  Procedures:  5/24 ETT 5/24 L chest tube  Significant Diagnostic Tests:  5/23 CTA chest>>A 14.0 cm x 7.8 cm thick walled (approximately 1.3 cm) cavitary lesion within the anterolateral aspect of the mid and lower left lung, increased in size when compared to the prior exam. This is likely consistent with an evolving empyema.  Micro Data:  5/23 Sars-Cov-2>>negative 5/23 MRSA screen>> 5/23 Urine culture>> 5/23 Blood culture>> 5/23 Body fluid culture (empyema)>>  Antimicrobials:  Vancomycin 5/23- Cefepime  5/23- Flagyl 5/23-  Interim history/subjective:  Worsening mental and respiratory status requiring intubation and ICU transfer  Objective   Blood pressure (!) 128/51, pulse 72, temperature (!) 97.5 F (36.4 C), temperature source Oral, resp. rate (!) 36, height 6\' 1"  (1.854 m), weight 81.7 kg, SpO2 98 %.    FiO2 (%):  [4 %] 4 %   Intake/Output Summary (Last 24 hours) at 09/22/2019 0253 Last data filed at 09/21/2019 2036 Gross per 24 hour  Intake 320 ml  Output 1200 ml  Net -880 ml   Filed Weights   09/21/19 1552 09/22/19 0234  Weight: 81.6 kg 81.7 kg    General:  Thin, disheveled M in moderate respiratory distress HEENT: MM pink/moist Neuro: restless, answering questions, intermittently not following commands CV: s1s2 tachycardia, no m/r/g PULM:  Decreased breath sound LLL GI: soft, bsx4 active  Extremities: warm/dry, no edema  Skin: no rashes or lesions  Resolved Hospital Problem list     Assessment & Plan:   Acute hypoxic respiratory failure secondary to LLL Empyema and RML infiltrate Patient intubated and pigtail catheter placed, frank purulent drainage sent for culture -Follow culture results, continue broad-spectrum antibiotics -Analgesia and sedation with fentanyl and propofol -May need CT surgery consult for possible VATS --Maintain full vent support with SAT/SBT as tolerated -titrate Vent setting to maintain SpO2 greater than or equal to 90%. -HOB elevated 30 degrees. -Plateau pressures less than 30 cm H20.  -Follow chest x-ray, ABG prn.   -Bronchial hygiene and RT/bronchodilator protocol.     ETOH  abuse Sounds like had mostly quit prior to admission -continue thiamine, MV, folic acid    Best practice:  Diet: NPO Pain/Anxiety/Delirium protocol (if indicated): propofol and fentanyl VAP protocol (if indicated): HOB 30 degrees, suction prn DVT prophylaxis: lovenox GI prophylaxis: protonix Glucose control: SSI Mobility: bed rest Code Status: full  code  Family Communication: spoke with patient's mother and updated her with patient's course Disposition: ICU   Labs   CBC: Recent Labs  Lab 09/21/19 1618  WBC 21.4*  NEUTROABS 18.4*  HGB 11.1*  HCT 31.7*  MCV 92.7  PLT 406*    Basic Metabolic Panel: Recent Labs  Lab 09/21/19 1618  NA 126*  K 3.4*  CL 91*  CO2 22  GLUCOSE 180*  BUN 11  CREATININE 0.75  CALCIUM 7.8*   GFR: Estimated Creatinine Clearance: 126.2 mL/min (by C-G formula based on SCr of 0.75 mg/dL). Recent Labs  Lab 09/21/19 1618 09/22/19 0128  WBC 21.4*  --   LATICACIDVEN 2.0* 1.7    Liver Function Tests: Recent Labs  Lab 09/21/19 1618  AST 43*  ALT 53*  ALKPHOS 134*  BILITOT 1.5*  PROT 7.8  ALBUMIN 2.2*   No results for input(s): LIPASE, AMYLASE in the last 168 hours. No results for input(s): AMMONIA in the last 168 hours.  ABG    Component Value Date/Time   PHART 7.415 09/21/2019 2310   PCO2ART 36.2 09/21/2019 2310   PO2ART 78.8 (L) 09/21/2019 2310   HCO3 22.9 09/21/2019 2310   ACIDBASEDEF 1.1 09/21/2019 2310   O2SAT 95.2 09/21/2019 2310     Coagulation Profile: Recent Labs  Lab 09/21/19 1618  INR 1.4*    Cardiac Enzymes: No results for input(s): CKTOTAL, CKMB, CKMBINDEX, TROPONINI in the last 168 hours.  HbA1C: No results found for: HGBA1C  CBG: No results for input(s): GLUCAP in the last 168 hours.  Review of Systems:   Unable to obtain secondary to mental status  Past Medical History  He,  has a past medical history of Alcohol abuse.   Surgical History   History reviewed. No pertinent surgical history.   Social History   reports that he has been smoking cigarettes. He has never used smokeless tobacco. He reports current alcohol use. He reports previous drug use. Drug: Cocaine.   Family History   His family history is not on file.   Allergies No Known Allergies   Home Medications  Prior to Admission medications   Medication Sig Start Date End Date  Taking? Authorizing Provider  meclizine (ANTIVERT) 25 MG tablet Take 1 tablet (25 mg total) by mouth 3 (three) times daily as needed for dizziness. 10/10/18   Ward, Chase Picket, PA-C     Critical care time: 65 minutes     CRITICAL CARE Performed by: Darcella Gasman Orville Mena   Total critical care time: 65 minutes  Critical care time was exclusive of separately billable procedures and treating other patients.  Critical care was necessary to treat or prevent imminent or life-threatening deterioration.  Critical care was time spent personally by me on the following activities: development of treatment plan with patient and/or surrogate as well as nursing, discussions with consultants, evaluation of patient's response to treatment, examination of patient, obtaining history from patient or surrogate, ordering and performing treatments and interventions, ordering and review of laboratory studies, ordering and review of radiographic studies, pulse oximetry and re-evaluation of patient's condition.  Darcella Gasman Colie Josten, PA-C

## 2019-09-22 NOTE — Procedures (Addendum)
Intubation Procedure Note Victor Torres [id number] [birth year]  Procedure: Intubation Indications: Airway protection and maintenance  Procedure Details Consent: Unable to obtain consent because of emergent medical necessity. Time Out: Verified patient identification, verified procedure, site/side was marked, verified correct patient position, special equipment/implants available, medications/allergies/relevent history reviewed, required imaging and test results available.  Performed  Maximum sterile technique was used including antiseptics, gloves, gown and mask.  MAC and 3    Evaluation Hemodynamic Status: BP stable throughout; O2 sats: stable throughout Patient's Current Condition: stable Complications: No apparent complications Patient did tolerate procedure well. Chest X-ray ordered to verify placement.  CXR: pending.   MD obtained consent for procedure.  Roby Lofts Fenix Ruppe 09/22/2019

## 2019-09-22 NOTE — Procedures (Signed)
Procedure: Endotracheal intubation Diagnosis: Acute hypoxemic respiratory failure  After obtaining a verbal consent and due to desaturation and severe respiratory distress patient was intubated after giving 40 mg of etomidate 4 mg of Versed and 100 of fentanyl followed by 80 mg of rocuronium. Patient was intubated from 1st attempt with size 8 endotracheal tube with direct visualization with glide scope. Endotracheal tube was secured at 24 cm at the lips. There was good air exchange bilaterally with change in color on the CO2 detector. Patient tolerated procedure well no complications. Chest x-ray confirmed position.

## 2019-09-22 NOTE — Significant Event (Addendum)
Rapid Response Event Note  Overview: Increased WOB and increased oxygen demand in the setting of acute respiratory failure/AMS  Initial Focused Assessment: Notified by Camelia Eng RT regarding increased WOB/tachypnea and increased oxygen from 4L [place] to 14L [place]. Upon arrival, Mr. Pound is lethargic, oriented to self, disoriented x3 and getting OOB.  He appears critically ill with tachypnea and accessory muscle use, dusky colored skin, glassy eyes and anxiety r/t SOB. Pt is high risk for arrest and transfer to ICU. TRH and PCCM have been to bedside to assess patient. Recommendations for drainage of loculated pleural effusion by IR (in am as IR doesn't place CT overnight). Pt may need acute placement of CT at bedside if he continues to decompensate. On [place] HFNC at 14L his sats 94% with RR 29.  0240-pt transferred to 2H01 via bed, oxygen and medical devices without complication.   Interventions: -No RRRT interventions  Plan of Care (if not transferred): -Keep [facility] greater than 45 degrees -strict BR -keep NPO for now -High risk for transfer to ICU  Event Summary: Call 0105 Arrived 0108 Call ended 0138  Rose Fillers

## 2019-09-22 NOTE — Progress Notes (Addendum)
Initial Nutrition Assessment  DOCUMENTATION CODES:   Not applicable  INTERVENTION:  Initiate TF with Vital AF 1.2 fomula via OGT at goal rate of 60 ml/h (1440 ml per day) and Prostat 30 ml once daily.  Tube feeding with current propofol rate to provide 2151 kcals, 123 gm protein, 1166 ml free water daily.  NUTRITION DIAGNOSIS:   Inadequate oral intake related to inability to eat as evidenced by NPO status.  GOAL:   Patient will meet greater than or equal to 90% of their needs  MONITOR:   Vent status, Skin, TF tolerance, Weight trends, Labs, I & O's  REASON FOR ASSESSMENT:   Ventilator, Consult Enteral/tube feeding initiation and management  ASSESSMENT:   49 y.o. M with PMH of ETOH and tobacco abuse who developed cough and dysnpea approximately one week ago, admitted with L-sided empyema and required transfer to ICU, intubation and chest tube. L chest tube placed 5/24 with foul smelling pus like drainage. Pt with severe sepsis.   RD working remotely.  Patient is currently intubated on ventilator support MV: 12.4 L/min Temp (24hrs), Avg:98.9 F (37.2 C), Min:97.5 F (36.4 C), Max:103.2 F (39.6 C)  Propofol: 12.24 ml/hr which provides 323 kcal/day  Per surgery, no surgical drainage indicated at this time. RD consulted for enteral/tube feeding initiation and management. Per RN, pt with OGT in place. RD to order tube feeding.   Unable to complete Nutrition-Focused physical exam at this time.   Labs and medications reviewed.   Diet Order:   Diet Order            Diet NPO time specified  Diet effective now              EDUCATION NEEDS:   Not appropriate for education at this time  Skin:  Skin Assessment: Reviewed RN Assessment  Last BM:  5/23  Height:   Ht Readings from Last 1 Encounters:  09/22/19 6\' 1"  (1.854 m)    Weight:   Wt Readings from Last 1 Encounters:  09/22/19 81.7 kg    BMI:  Body mass index is 23.76 kg/m.  Estimated Nutritional  Needs:   Kcal:  2054  Protein:  120-140 grams  Fluid:  >/= 2 L/day   2055, MS, RD, LDN RD pager number/after hours weekend pager number on Amion.

## 2019-09-22 NOTE — Progress Notes (Signed)
Pt began to get anxious, nauseous and diaphoretic Pt began to desat in low to mid 80s and maintained on 5 L of oxygen. Pt had to be  put on 14L of oxygen nasal canula high flow to bring o2 up to early 90s. RT was called and rapid response came to see him.

## 2019-09-22 NOTE — Progress Notes (Signed)
Patient evaluated by myself and PCCM attending Dr. Minette Headland, full note to follow, but given thick-walled, loculated empyema would recommend IR evaluation for guided chest tube placement, PCCM will continue to follow.   Darcella Gasman Chester Sibert, PA-C

## 2019-09-22 NOTE — Progress Notes (Signed)
  Echocardiogram 2D Echocardiogram has been performed.  Victor Torres 09/22/2019, 2:14 PM

## 2019-09-23 ENCOUNTER — Inpatient Hospital Stay (HOSPITAL_COMMUNITY): Payer: 59

## 2019-09-23 DIAGNOSIS — J869 Pyothorax without fistula: Secondary | ICD-10-CM

## 2019-09-23 DIAGNOSIS — J9601 Acute respiratory failure with hypoxia: Secondary | ICD-10-CM

## 2019-09-23 LAB — URINE CULTURE: Culture: NO GROWTH

## 2019-09-23 LAB — BASIC METABOLIC PANEL
Anion gap: 9 (ref 5–15)
BUN: 20 mg/dL (ref 6–20)
CO2: 24 mmol/L (ref 22–32)
Calcium: 8.4 mg/dL — ABNORMAL LOW (ref 8.9–10.3)
Chloride: 104 mmol/L (ref 98–111)
Creatinine, Ser: 0.54 mg/dL — ABNORMAL LOW (ref 0.61–1.24)
GFR calc Af Amer: >60 mL/min (ref >60)
GFR calc non Af Amer: >60 mL/min (ref >60)
Glucose, Bld: 209 mg/dL — ABNORMAL HIGH (ref 70–99)
Potassium: 4.8 mmol/L (ref 3.5–5.1)
Sodium: 137 mmol/L (ref 135–145)

## 2019-09-23 LAB — GLUCOSE, CAPILLARY
Glucose-Capillary: 106 mg/dL — ABNORMAL HIGH (ref 70–99)
Glucose-Capillary: 125 mg/dL — ABNORMAL HIGH (ref 70–99)
Glucose-Capillary: 157 mg/dL — ABNORMAL HIGH (ref 70–99)
Glucose-Capillary: 189 mg/dL — ABNORMAL HIGH (ref 70–99)
Glucose-Capillary: 85 mg/dL (ref 70–99)
Glucose-Capillary: 97 mg/dL (ref 70–99)

## 2019-09-23 LAB — CBC
HCT: 27 % — ABNORMAL LOW (ref 39.0–52.0)
Hemoglobin: 8.8 g/dL — ABNORMAL LOW (ref 13.0–17.0)
MCH: 31.7 pg (ref 26.0–34.0)
MCHC: 32.6 g/dL (ref 30.0–36.0)
MCV: 97.1 fL (ref 80.0–100.0)
Platelets: 360 10*3/uL (ref 150–400)
RBC: 2.78 MIL/uL — ABNORMAL LOW (ref 4.22–5.81)
RDW: 13.5 % (ref 11.5–15.5)
WBC: 19.9 10*3/uL — ABNORMAL HIGH (ref 4.0–10.5)
nRBC: 0 % (ref 0.0–0.2)

## 2019-09-23 LAB — CYTOLOGY - NON PAP

## 2019-09-23 LAB — VANCOMYCIN, TROUGH: Vancomycin Tr: 12 ug/mL — ABNORMAL LOW (ref 15–20)

## 2019-09-23 LAB — TRIGLYCERIDES: Triglycerides: 176 mg/dL — ABNORMAL HIGH (ref <150)

## 2019-09-23 LAB — MAGNESIUM: Magnesium: 2.1 mg/dL (ref 1.7–2.4)

## 2019-09-23 LAB — PHOSPHORUS: Phosphorus: 3.5 mg/dL (ref 2.5–4.6)

## 2019-09-23 MED ORDER — DEXMEDETOMIDINE HCL IN NACL 400 MCG/100ML IV SOLN
0.4000 ug/kg/h | INTRAVENOUS | Status: DC
  Administered 2019-09-23: 0.7 ug/kg/h via INTRAVENOUS
  Filled 2019-09-23: qty 100

## 2019-09-23 MED ORDER — ORAL CARE MOUTH RINSE
15.0000 mL | Freq: Two times a day (BID) | OROMUCOSAL | Status: DC
  Administered 2019-09-24 – 2019-09-25 (×3): 15 mL via OROMUCOSAL

## 2019-09-23 NOTE — Progress Notes (Signed)
eLink Physician-Brief Progress Note Patient Name: Victor Torres DOB: [birth year] MRN: [id number]   Date of Service  09/23/2019  HPI/Events of Note  Urine 483 on bladder scan.   eICU Interventions  In and out cath once.      Intervention Category Intermediate Interventions: Other:  Ranee Gosselin 09/23/2019, 6:16 AM

## 2019-09-23 NOTE — Progress Notes (Signed)
Called [facility] and spoke with Marisue Ivan, RN and made her aware of bladder scan amount of 4873 ml with no void.  Asked RN to ask MD for in and out cath order.

## 2019-09-23 NOTE — Progress Notes (Signed)
Pharmacy Antibiotic Note  Victor Torres is a 49 y.o. male admitted on 09/21/2019 with sepsis and empyema.  Pharmacy has been consulted for Cefepime and Vancomycin dosing. Cr remains stable, vancomycin trough subtherapeutic at 12 mcg/ml - will not adjust for now as it will likely accumulate more..   Plan:  -Continue cefepime 2g IV q8h -Continue vancomycin 1000mg  IV q8h -Watch Cr, cultures, LOT -Repeat vancomycin trough as needed   Height: 6\' 1"  (185.4 cm) Weight: 74.5 kg (164 lb 3.9 oz) IBW/kg (Calculated) : 79.9  Temp (24hrs), Avg:97.8 F (36.6 C), Min:97.6 F (36.4 C), Max:98.2 F (36.8 C)  Recent Labs  Lab 09/21/19 1618 09/22/19 0128 09/22/19 0607 09/22/19 1037 09/23/19 0242  WBC 21.4*  --   --   --  19.9*  CREATININE 0.75  --  0.56* 0.55* 0.54*  LATICACIDVEN 2.0* 1.7  --   --   --     Estimated Creatinine Clearance: 117.7 mL/min (A) (by C-G formula based on SCr of 0.54 mg/dL (L)).    No Known Allergies  Antimicrobials this admission: 5/23 Cefepime >>  5/23 Vancomycin >>    Microbiology results: 5/23 BCx: NGTD 5/23 UCx: no growth 5/24 MRSA PCR: negative 5/24 Fluid Cx: few GPCs, abundant GNRs   6/24, PharmD, BCPS Clinical Pharmacist [phone] Please check AMION for all [facility] numbers 09/23/2019

## 2019-09-23 NOTE — Progress Notes (Signed)
NAME:  Victor Torres, MRN:  [id number], DOB:  March 03, 1971, LOS: 2 ADMISSION DATE:  09/21/2019, CONSULTATION DATE:  09/23/19 REFERRING MD:  Hospitalist, CHIEF COMPLAINT:  empyema   Brief History   49 y.o. M with PMH of ETOH and tobacco abuse who developed cough and dysnpea approximately one week ago, admitted with L-sided empyema and required transfer to ICU, intubation and chest tube.  History of present illness   49 y.o. M with PMH of ETOH and tobacco abuse who presented to [place] [facility] with shortness of breath.   He initially went to urgent care on 5/17 and had an outpatient CT showing possible empyema, he was scheduled for a follow up appointment, but became increasingly SOB, so presented to the ED with fever, chills and cough productive of purulent sputum.  He was requiring 5L [place] oxygen and CTA significant for 14x7.8cm thick walled cavitary lesion in the Mid-lower L lung. He was treated with Vanc, cefepime, flagyl and Decadron.  IR consulted for chest tube, asked that PCCM evaluate overnight.    He continued to worsen and required transfer to the ICU, intubation and placement of chest tube.  Past Medical History   has a past medical history of Alcohol abuse., per notes he quit one year ago, but did three shots before presenting to the ED.   Significant Hospital Events   5/23 admitted to hospitalists 5/24 PCCM consult, transfer to ICU and intubation  Consults:  PCCM  Procedures:  5/24 ETT 5/24 L chest tube  Significant Diagnostic Tests:  5/23 CTA chest>>A 14.0 cm x 7.8 cm thick walled (approximately 1.3 cm) cavitary lesion within the anterolateral aspect of the mid and lower left lung, increased in size when compared to the prior exam. This is likely consistent with an evolving empyema.  Micro Data:  5/23 Sars-Cov-2>>negative 5/23 MRSA screen>> 5/23 Urine culture>> 5/23 Blood culture>> 5/23 Body fluid culture (empyema)>>  Antimicrobials:  Vancomycin 5/23- Cefepime  5/23- Flagyl 5/23-  Interim history/subjective:  On vent, chest tube minimal output.  Objective   Blood pressure (!) 112/49, pulse 64, temperature 97.9 F (36.6 C), temperature source Oral, resp. rate 19, height 6\' 1"  (1.854 m), weight 74.5 kg, SpO2 99 %.    Vent Mode: PRVC FiO2 (%):  [40 %-70 %] 40 % Set Rate:  [18 bmp] 18 bmp Vt Set:  [630 mL] 630 mL PEEP:  [5 cmH20] 5 cmH20 Plateau Pressure:  [14 cmH20-18 cmH20] 18 cmH20   Intake/Output Summary (Last 24 hours) at 09/23/2019 0706 Last data filed at 09/23/2019 8250 Gross per 24 hour  Intake 3240.99 ml  Output 1125 ml  Net 2115.99 ml   Filed Weights   09/21/19 1552 09/22/19 0234 09/23/19 0500  Weight: 81.6 kg 81.7 kg 74.5 kg    GEN: no acute distress heavily sedated HEENT: ETT in place, minimal thick secretions CV: RRR, ext warm PULM: Scattered rhonci, diminished L base, left chest tube purulent drainage, no air leak or tidaling GI: Soft, +BS EXT: No edema NEURO: Withdraws x 4 PSYCH: RASS -4 SKIN: No rashes  WBC improved Plts improved Sugars mildly elevated    Resolved Hospital Problem list     Assessment & Plan:   Acute hypoxic respiratory failure secondary to L empyema and acute metabolic encephalopathy - Wean sedation to precedex alone then SBT - Monitor chest tube output, will consider CT chest either later today or tomorrow AM to see if pleural thrombolytics are warranted - Dr. Cyndia Bent has evaluated and thinks we  can get by without decortication  ETOH abuse Sounds like had mostly quit prior to admission -continue thiamine, MV, folic acid    Best practice:  Diet: NPO Pain/Anxiety/Delirium protocol (if indicated): propofol and fentanyl VAP protocol (if indicated): HOB 30 degrees, suction prn DVT prophylaxis: lovenox GI prophylaxis: protonix Glucose control: SSI Mobility: bed rest Code Status: full code  Family Communication: will update when they come to bedside Disposition: ICU pending vent  liberation   The patient is critically ill with multiple organ systems failure and requires high complexity decision making for assessment and support, frequent evaluation and titration of therapies, application of advanced monitoring technologies and extensive interpretation of multiple databases. Critical Care Time devoted to patient care services described in this note independent of APP/resident time (if applicable)  is 32 minutes.   Myrla Halsted MD [facility] 09/23/2019 7:11 AM Personal pager: [phone] If unanswered, please page CCM On-call: #[phone]

## 2019-09-23 NOTE — Progress Notes (Signed)
Agitated trying to pull out ETT with safety mittens on. Will follow commands but then continues to try to pull out ETT. Fentanyl bolus given.

## 2019-09-23 NOTE — Procedures (Signed)
Extubation Procedure Note  Patient Details:   Name: Victor Torres DOB: 1970/09/26 MRN: [id number]   Airway Documentation:    Vent end date: 09/23/19 Vent end time: 0752   Evaluation  O2 sats: stable throughout Complications: No apparent complications Patient did tolerate procedure well. Bilateral Breath Sounds: Clear   Yes, pt could speak post extubation.  Pt extubated to 4 l/m without difficulty per physician's order.   Audrie Lia 09/23/2019, 7:53 AM

## 2019-09-23 NOTE — Progress Notes (Signed)
   09/23/19 2241  Assess: MEWS Score  Temp 97.9 F (36.6 C)  BP (!) 116/53  Pulse Rate 84  Resp (!) 26  SpO2 98 %  Assess: MEWS Score  MEWS Temp 0  MEWS Systolic 0  MEWS Pulse 0  MEWS RR 2  MEWS LOC 0  MEWS Score 2  MEWS Score Color Yellow  Assess: if the MEWS score is Yellow or Red  Were vital signs taken at a resting state? Yes  Focused Assessment Documented focused assessment  Early Detection of Sepsis Score *See Row Information* Low  MEWS guidelines implemented *See Row Information* Yes  Treat  MEWS Interventions Escalated (See documentation below)  Take Vital Signs  Increase Vital Sign Frequency  Yellow: Q 2hr X 2 then Q 4hr X 2, if remains yellow, continue Q 4hrs  Escalate  MEWS: Escalate Yellow: discuss with charge nurse/RN and consider discussing with provider and RRT  Notify: Charge Nurse/RN  Name of Charge Nurse/RN Notified Lynnell Grain  Date Charge Nurse/RN Notified 09/23/19  Time Charge Nurse/RN Notified 2241  Notify: Provider  Provider Name/Title Dr Warrick Parisian  Date Provider Notified 09/23/19  Time Provider Notified 2255  Notification Type Page  Notification Reason Other (Comment) (yellow mews)  Response No new orders  Date of Provider Response 09/23/19  Time of Provider Response 2306

## 2019-09-24 DIAGNOSIS — E440 Moderate protein-calorie malnutrition: Secondary | ICD-10-CM | POA: Diagnosis present

## 2019-09-24 DIAGNOSIS — F17200 Nicotine dependence, unspecified, uncomplicated: Secondary | ICD-10-CM | POA: Diagnosis present

## 2019-09-24 DIAGNOSIS — E871 Hypo-osmolality and hyponatremia: Secondary | ICD-10-CM

## 2019-09-24 DIAGNOSIS — J869 Pyothorax without fistula: Secondary | ICD-10-CM

## 2019-09-24 DIAGNOSIS — E876 Hypokalemia: Secondary | ICD-10-CM

## 2019-09-24 DIAGNOSIS — F1010 Alcohol abuse, uncomplicated: Secondary | ICD-10-CM | POA: Diagnosis present

## 2019-09-24 LAB — CBC
HCT: 28.6 % — ABNORMAL LOW (ref 39.0–52.0)
Hemoglobin: 9.4 g/dL — ABNORMAL LOW (ref 13.0–17.0)
MCH: 31.2 pg (ref 26.0–34.0)
MCHC: 32.9 g/dL (ref 30.0–36.0)
MCV: 95 fL (ref 80.0–100.0)
Platelets: 389 10*3/uL (ref 150–400)
RBC: 3.01 MIL/uL — ABNORMAL LOW (ref 4.22–5.81)
RDW: 13.8 % (ref 11.5–15.5)
WBC: 16.9 10*3/uL — ABNORMAL HIGH (ref 4.0–10.5)
nRBC: 0 % (ref 0.0–0.2)

## 2019-09-24 LAB — BASIC METABOLIC PANEL
Anion gap: 11 (ref 5–15)
BUN: 16 mg/dL (ref 6–20)
CO2: 25 mmol/L (ref 22–32)
Calcium: 8 mg/dL — ABNORMAL LOW (ref 8.9–10.3)
Chloride: 101 mmol/L (ref 98–111)
Creatinine, Ser: 0.64 mg/dL (ref 0.61–1.24)
GFR calc Af Amer: >60 mL/min (ref >60)
GFR calc non Af Amer: >60 mL/min (ref >60)
Glucose, Bld: 92 mg/dL (ref 70–99)
Potassium: 3.7 mmol/L (ref 3.5–5.1)
Sodium: 137 mmol/L (ref 135–145)

## 2019-09-24 LAB — GLUCOSE, CAPILLARY
Glucose-Capillary: 110 mg/dL — ABNORMAL HIGH (ref 70–99)
Glucose-Capillary: 116 mg/dL — ABNORMAL HIGH (ref 70–99)
Glucose-Capillary: 134 mg/dL — ABNORMAL HIGH (ref 70–99)
Glucose-Capillary: 84 mg/dL (ref 70–99)
Glucose-Capillary: 87 mg/dL (ref 70–99)
Glucose-Capillary: 92 mg/dL (ref 70–99)

## 2019-09-24 LAB — BODY FLUID CULTURE

## 2019-09-24 MED ORDER — ENSURE ENLIVE PO LIQD
237.0000 mL | Freq: Three times a day (TID) | ORAL | Status: DC
  Administered 2019-09-24: 237 mL via ORAL

## 2019-09-24 MED ORDER — PANTOPRAZOLE SODIUM 40 MG PO TBEC
40.0000 mg | DELAYED_RELEASE_TABLET | Freq: Every day | ORAL | Status: DC
  Administered 2019-09-25 – 2019-09-27 (×3): 40 mg via ORAL
  Filled 2019-09-24 (×3): qty 1

## 2019-09-24 MED ORDER — NICOTINE 21 MG/24HR TD PT24
21.0000 mg | MEDICATED_PATCH | Freq: Every day | TRANSDERMAL | Status: DC
  Administered 2019-09-24 – 2019-09-27 (×4): 21 mg via TRANSDERMAL
  Filled 2019-09-24 (×4): qty 1

## 2019-09-24 MED ORDER — SODIUM CHLORIDE 0.9 % IV SOLN
2.0000 g | INTRAVENOUS | Status: DC
  Administered 2019-09-24 – 2019-09-25 (×2): 2 g via INTRAVENOUS
  Filled 2019-09-24: qty 2
  Filled 2019-09-24: qty 20
  Filled 2019-09-24: qty 2

## 2019-09-24 NOTE — Progress Notes (Signed)
PHARMACIST - PHYSICIAN COMMUNICATION  DR:   Janee Morn  CONCERNING: IV to Oral Route Change Policy  RECOMMENDATION: This patient is receiving Protonix by the intravenous route.  Based on criteria approved by the Pharmacy and Therapeutics Committee, the intravenous medication(s) is/are being converted to the equivalent oral dose form(s).   DESCRIPTION: These criteria include:  The patient is eating (either orally or via tube) and/or has been taking other orally administered medications for a least 24 hours  The patient has no evidence of active gastrointestinal bleeding or impaired GI absorption (gastrectomy, short bowel, patient on TNA or NPO).  If you have questions about this conversion, please contact the Pharmacy Department  []   [phone] )  ( [phone] []   [phone] )  [facility] [x]   [phone] )  [facility] []   [phone] )  [facility] []   [phone] )  [facility]   Bug Tussle, [facility] 09/24/2019 2:21 PM

## 2019-09-24 NOTE — Progress Notes (Addendum)
NAME:  Victor Torres, MRN:  [id number], DOB:  [birth year], LOS: 3 ADMISSION DATE:  09/21/2019, CONSULTATION DATE:  09/24/19 REFERRING MD:  Hospitalist, CHIEF COMPLAINT:  empyema   Brief History   49 y.o. M with PMH of ETOH and tobacco abuse who developed cough and dysnpea approximately one week ago, admitted with L-sided empyema and required transfer to ICU, intubation and chest tube.  History of present illness   49 y.o. M with PMH of ETOH and tobacco abuse who presented to [place] ED with shortness of breath.   He initially went to urgent care on 5/17 and had an outpatient CT showing possible empyema, he was scheduled for a follow up appointment, but became increasingly SOB, so presented to the ED with fever, chills and cough productive of purulent sputum.  He was requiring 5L [place] oxygen and CTA significant for 14x7.8cm thick walled cavitary lesion in the Mid-lower L lung. He was treated with Vanc, cefepime, flagyl and Decadron.  IR consulted for chest tube, asked that PCCM evaluate overnight.    He continued to worsen and required transfer to the ICU, intubation and placement of chest tube.  Past Medical History   has a past medical history of Alcohol abuse., per notes he quit one year ago, but did three shots before presenting to the ED.   Significant Hospital Events   5/23 admitted to hospitalists 5/24 PCCM consult, transfer to ICU and intubation  Consults:  PCCM  Procedures:  5/24 ETT 5/24 L chest tube  Significant Diagnostic Tests:  5/23 CTA chest>>A 14.0 cm x 7.8 cm thick walled (approximately 1.3 cm) cavitary lesion within the anterolateral aspect of the mid and lower left lung, increased in size when compared to the prior exam. This is likely consistent with an evolving empyema.  Micro Data:  5/23 Sars-Cov-2>>negative 5/23 MRSA screen-negative 5/23 Urine culture>> 5/23 Blood culture>> 5/23 Body fluid culture (empyema)-ABUNDANT STREPTOCOCCUS CONSTELLATUS    Antimicrobials:  Vancomycin 5/23- Cefepime 5/23- Flagyl 5/23- Ceftriaxone 5/26>>  Interim history/subjective:  Comfortable in bed, somnolent  Objective   Blood pressure (!) 131/54, pulse 78, temperature 97.7 F (36.5 C), temperature source Oral, resp. rate (!) 26, height 6\' 1"  (1.854 m), weight 81.6 kg, SpO2 94 %.        Intake/Output Summary (Last 24 hours) at 50/26/2021 0754 Last data filed at 09/24/2019 5400 Gross per 24 hour  Intake 1252.52 ml  Output 1595 ml  Net -342.48 ml   Filed Weights   09/23/19 0500 09/23/19 2241 09/24/19 0500  Weight: 74.5 kg 81.9 kg 81.6 kg    GEN: In no acute distress HEENT: Moist oral mucosa CV: S1-S2 appreciated PULM: Rhonchi, decreased air movement  GI: Soft, +BS  Leukocytosis-improving Chest x-ray reviewed by myself showing improvement in findings-partial drainage of noted empyema  BUN 16 creatinine 0.64 Resolved Hospital Problem list     Assessment & Plan:   Acute hypoxic respiratory failure secondary to L empyema and acute metabolic encephalopathy -Monitor closely -Oxygen supplementation as needed - Monitor chest tube output, will obtain chest CT in a few days-probably 48 hours - Dr. Cyndia Bent has evaluated and thinks we can get by without decortication -Discussed with Dr. Cyndia Bent at bedside this morning  ETOH abuse Sounds like had mostly quit prior to admission -continue thiamine, MV, folic acid  Metabolic encephalopathy  Empyema -Significant for Streptococcus constellatus -Organism is treatable with Rocephin  Chest tube output 170 cc of purulent fluid  Switch to ceftriaxone 2 g daily  Best  practice:  Diet: NPO may advance when patients become more awake Pain/Anxiety/Delirium protocol (if indicated):tylenol, CIWA protocol lorazepam VAP protocol (if indicated): not indicated at present DVT prophylaxis: lovenox GI prophylaxis: protonix Glucose control: SSI Mobility: bed rest Code Status: full code  Family  Communication: will update when they come to bedside Disposition: med surg  Virl Diamond, MD [facility] Pager: [phone]

## 2019-09-24 NOTE — Plan of Care (Signed)
  Problem: Activity: Goal: Risk for activity intolerance will decrease Outcome: Progressing   Problem: Nutrition: Goal: Adequate nutrition will be maintained Outcome: Progressing   Problem: Coping: Goal: Level of anxiety will decrease Outcome: Progressing   Problem: Elimination: Goal: Will not experience complications related to bowel motility Outcome: Progressing   

## 2019-09-24 NOTE — Progress Notes (Signed)
Nutrition Follow-up  DOCUMENTATION CODES:   Non-severe (moderate) malnutrition in context of chronic illness  INTERVENTION:   -MVI with minerals daily -Ensure Enlive po TID, each supplement provides 350 kcal and 20 grams of protein  NUTRITION DIAGNOSIS:   Moderate Malnutrition related to chronic illness(ETOH abuse) as evidenced by mild fat depletion, moderate fat depletion, mild muscle depletion, moderate muscle depletion.  Ongoing  GOAL:   Patient will meet greater than or equal to 90% of their needs  Progressing   MONITOR:   PO intake, Supplement acceptance, Diet advancement, Labs, Weight trends, Skin, I & O's  REASON FOR ASSESSMENT:   Ventilator, Consult Enteral/tube feeding initiation and management  ASSESSMENT:   49 y.o. M with PMH of ETOH and tobacco abuse who developed cough and dysnpea approximately one week ago, admitted with L-sided empyema and required transfer to ICU, intubation and chest tube.  5/25- extubated  Reviewed I/O's: -343 ml x 24 hours and +2 L since admission  UOP: 1.4 L x 24 hours  Chest tube output: 170 ml x 24 hours  Pt sleeping soundly at time of visit. He did not respond to voice or touch. Observed full liquid diet breakfast tray, which was untouched.   Reviewed wt hx; pt has experienced a 7.7% wt loss over the past year. While this is not significant for time frame, it is concerning given pt's malnutrition and ETOH abuse.   Labs reviewed: CBGS: 84-116.   NUTRITION - FOCUSED PHYSICAL EXAM:    Most Recent Value  Orbital Region  Moderate depletion  Upper Arm Region  Mild depletion  Thoracic and Lumbar Region  Mild depletion  Buccal Region  Mild depletion  Temple Region  Moderate depletion  Clavicle Bone Region  Moderate depletion  Clavicle and Acromion Bone Region  Mild depletion  Scapular Bone Region  Mild depletion  Dorsal Hand  Mild depletion  Patellar Region  Mild depletion  Anterior Thigh Region  Mild depletion  Posterior  Calf Region  Mild depletion  Edema (RD Assessment)  None  Hair  Reviewed  Eyes  Reviewed  Mouth  Reviewed  Skin  Reviewed  Nails  Reviewed       Diet Order:   Diet Order            Diet full liquid Room service appropriate? Yes; Fluid consistency: Thin  Diet effective now              EDUCATION NEEDS:   No education needs have been identified at this time  Skin:  Skin Assessment: Reviewed RN Assessment  Last BM:  09/22/19  Height:   Ht Readings from Last 1 Encounters:  09/23/19 6\' 1"  (1.854 m)    Weight:   Wt Readings from Last 1 Encounters:  09/24/19 81.6 kg   BMI:  Body mass index is 23.73 kg/m.  Estimated Nutritional Needs:   Kcal:  09/26/19  Protein:  125-140 grams  Fluid:  > 2.4 L    [id number], RD, LDN, CDCES Registered Dietitian II Certified Diabetes Care and Education Specialist Please refer to [facility] for RD and/or RD on-call/weekend/after hours pager

## 2019-09-24 NOTE — Progress Notes (Signed)
Came to room as charge nurse, pt yellow MEWS.  O2 at 4 l and sats are 91-92. BP 134/52, pulse 73. Pt instructed on IS by the nurse and mom now at bedside.  Temp is 97.6 altho pt is diaphoretic. Notified Puja, RR about pt condition.  Pt is on continuous pulse oximetry.

## 2019-09-24 NOTE — Significant Event (Signed)
Rapid Response Event Note  Overview: "Second Set of Eyes"  Initial Focused Assessment: Came to the see the patient this morning, upon arrival, patient was drowsy, did not want to partcipate with answering my questions but after 2-3 minutes, patient was able to answer my questions and follow commands. Patient denied pain and was not short of breath or in any distress. Breathing comfortably on 4L [place] - saturations 93-95%, RR normal effort - 22, lung sounds - diminished throughout - few rhonchi - LT sided chest tube present. Skin warm and dry, good palpable radial pulses. VS 129/58(78), HR 75, 95% on 4L [place], RR 22  Interventions: -- IS   Plan of Care: -- Wean oxygen -- OOB  -- Encourage IS/CDB/FV   Event Summary:  Start Time 1035 End Time 1050  Victor Torres R

## 2019-09-24 NOTE — Progress Notes (Signed)
Pt sat is 93 on 4L [place], mom at bedside.  Pt remains on cont pulse ox and being monitored.

## 2019-09-24 NOTE — Progress Notes (Signed)
PROGRESS NOTE    Victor Torres  MHD:[id number] DOB: March 14, 1971 DOA: 09/21/2019 PCP: Patient, No Pcp Per   Chief Complaint  Patient presents with  . Shortness of Breath    Brief Narrative:  49 y.o. M with PMH of ETOH and tobacco abuse who presented to [place] ED with shortness of breath.   He initially went to urgent care on 5/17 and had an outpatient CT showing possible empyema, he was scheduled for a follow up appointment, but became increasingly SOB, so presented to the ED with fever, chills and cough productive of purulent sputum.  He was requiring 5L [place] oxygen and CTA significant for 14x7.8cm thick walled cavitary lesion in the Mid-lower L lung. He was treated with Vanc, cefepime, flagyl and Decadron.  IR consulted for chest tube, asked that PCCM evaluate overnight.    He continued to worsen and required transfer to the ICU, intubation and placement of chest tube. CT surgery also consulted and following.   Assessment & Plan:   Principal Problem:   Sepsis ([place]) Active Problems:   Acute respiratory failure with hypoxia (HCC)   Empyema (HCC)   Chest pain   Hyponatremia   Malnutrition of moderate degree   Alcohol abuse   Tobacco abuse  1 sepsis secondary to left empyema/acute hypoxic respiratory failure secondary to left empyema/left pneumonia, POA CT chest which was done on admission was concerning for thick-walled cavitary lesion in the mid lower left lung/empyema.  Patient's condition worsened was transferred to the ICU intubated and chest tube placed.  Cultures from empyema positive for Streptococcus constellatus.  Chest tube drained about 170 cc over the past 24 hours.  Patient afebrile.  Leukocytosis trending down.  GI consulted patient was seen by Dr. Caffie Pinto and feels no drainage is indicated at this time and to continue with pigtail catheter in addition to IV antibiotics.  Will need repeat CT chest in the few days/probably 48 hours.  Continue irrigation of chest  tube/pigtail catheter with sterile saline as needed to prevent occlusion with debris.  IV antibiotics have been narrowed down to IV Rocephin.  Pulmonary and CT surgery following.  Appreciate input and recommendations.  2.  Alcohol abuse Patient required Precedex drip which is subsequently been discontinued.  Continue thiamine, folic acid, multivitamin.  3.  Metabolic encephalopathy Likely secondary to problem #1.  Continue treatment as in #1.  Follow.  4.  Hypovolemic hyponatremia Likely secondary to poor oral intake.  Improved with hydration.  Follow.  5.  Mild hypokalemia Resolved.  6.  Tobacco abuse Nicotine patch.  7.  Malnutrition of moderate degree Tolerating full liquid diet.  Will advance to a regular diet.  Nutritional supplementation.   DVT prophylaxis: Lovenox Code Status: Full Family Communication: Updated patient and mother at bedside. Disposition:   Status is: Inpatient    Dispo: The patient is from: Home              Anticipated d/c is to: Likely home with home health versus SNF              Anticipated d/c date is: To be determined.              Patient currently with left chest tube placement currently draining, on IV antibiotics, being followed by CT surgery and PCCM.        Consultants:   PCCM: Dr.Aljishi 09/22/2019  Cardiothoracic surgery: Dr. Cyndia Bent 09/22/2019  Procedures:  CT angio chest 09/21/2019---A 14.0 cm x 7.8 cm thick walled (  approximately 1.3 cm) cavitary lesion within the anterolateral aspect of the mid and lower left lung, increased in size when compared to the prior exam. This is likely consistent with an evolving empyema.  Chest x-ray 09/21/2019, 09/22/2019, 09/23/2019  Abdominal ultrasound 09/22/2019  2D echo 09/22/2019  ETT 09/22/2019  Left chest tube placement 09/22/2019--per Dr. Skipper Cliche PCCM  Micro Data:  5/23 Sars-Cov-2>>negative 5/23 MRSA screen-negative 5/23 Urine culture>> 5/23 Blood culture>> 5/23 Body fluid culture  (empyema)-ABUNDANT STREPTOCOCCUS CONSTELL   Antimicrobials:  IV Rocephin 09/24/2019  IV cefepime 09/21/2019>>>> 09/24/2019  IV Flagyl 09/21/2019>>>> 09/24/2019  IV vancomycin 09/21/2019 >>>>> 09/24/2019   Subjective: Patient sleeping but arousable.  Complains of chest pain around chest tube site.  States shortness of breath is slowly improving.  Objective: Vitals:   09/24/19 0644 09/24/19 1010 09/24/19 1610 09/24/19 2033  BP: (!) 131/54 (!) 134/52 (!) 162/45 (!) 125/53  Pulse: 78 73 82 76  Resp: (!) _0 Temp: 97.7 F (36.5 C) 97.6 F (36.4 C) 98.2 F (36.8 C) 97.8 F (36.6 C)  TempSrc: Oral Oral Oral   SpO2: 94% 91% 93% 98%  Weight:      Height:        Intake/Output Summary (Last 24 hours) at 09/24/2019 2055 Last data filed at 09/24/2019 1900 Gross per 24 hour  Intake 625 ml  Output 1155 ml  Net -530 ml   Filed Weights   09/23/19 0500 09/23/19 2241 09/24/19 0500  Weight: 74.5 kg 81.9 kg 81.6 kg    Examination:  General exam: Appears calm and comfortable  Respiratory system: Clear to auscultation anterior lung fields. Respiratory effort normal. Cardiovascular system: S1 & S2 heard, RRR. No JVD, murmurs, rubs, gallops or clicks. No pedal edema.  Chest tube draining purulent fluid. Gastrointestinal system: Abdomen is nondistended, soft and nontender. No organomegaly or masses felt. Normal bowel sounds heard. Central nervous system: Alert and oriented. No focal neurological deficits. Extremities: Symmetric 5 x 5 power. Skin: No rashes, lesions or ulcers Psychiatry: Judgement and insight appear normal. Mood & affect appropriate.     Data Reviewed: I have personally reviewed following labs and imaging studies  CBC: Recent Labs  Lab 09/21/19 1618 09/22/19 0537 09/23/19 0242 09/24/19 0241  WBC 21.4*  --  19.9* 16.9*  NEUTROABS 18.4*  --   --   --   HGB 11.1* 10.2* 8.8* 9.4*  HCT 31.7* 30.0* 27.0* 28.6*  MCV 92.7  --  97.1 95.0  PLT 406*  --  360 389     Basic Metabolic Panel: Recent Labs  Lab 09/21/19 1618 09/21/19 1618 09/22/19 0537 09/22/19 0607 09/22/19 1037 09/22/19 1316 09/22/19 1635 09/23/19 0242 09/24/19 0241  NA 126*   < > 136 134* 135  --   --  137 137  K 3.4*   < > 4.2 4.1 4.4  --   --  4.8 3.7  CL 91*  --   --  104 105  --   --  104 101  CO2 22  --   --  23 24  --   --  24 25  GLUCOSE 180*  --   --  211* 188*  --   --  209* 92  BUN 11  --   --  11 11  --   --  20 16  CREATININE 0.75  --   --  0.56* 0.55*  --   --  0.54* 0.64  CALCIUM 7.8*  --   --  8.2* 8.2*  --   --  8.4* 8.0*  MG  --   --   --  2.1  --  2.1 2.4 2.1  --   PHOS  --   --   --  3.6  --  3.5 3.8 3.5  --    < > = values in this interval not displayed.    GFR: Estimated Creatinine Clearance: 126.2 mL/min (by C-G formula based on SCr of 0.64 mg/dL).  Liver Function Tests: Recent Labs  Lab 09/21/19 1618  AST 43*  ALT 53*  ALKPHOS 134*  BILITOT 1.5*  PROT 7.8  ALBUMIN 2.2*    CBG: Recent Labs  Lab 09/24/19 0359 09/24/19 0758 09/24/19 1245 09/24/19 1630 09/24/19 2035  GLUCAP 87 84 116* 110* 92     Recent Results (from the past 240 hour(s))  Blood Culture (routine x 2)     Status: None (Preliminary result)   Collection Time: 09/21/19  4:19 PM   Specimen: BLOOD  Result Value Ref Range Status   Specimen Description   Final    BLOOD LEFT ANTECUBITAL Performed at [facility], [address] [place], [place] [address]    Special Requests   Final    BOTTLES DRAWN AEROBIC AND ANAEROBIC Blood Culture adequate volume Performed at [facility], [address] [place], [place] [address]    Culture   Final    NO GROWTH 3 DAYS Performed at [facility], [place] [address], [place], [place] [address]    Report Status PENDING  Incomplete  Blood Culture (routine x 2)     Status: None (Preliminary result)   Collection Time: 09/21/19  4:20 PM   Specimen: BLOOD  Result Value Ref Range Status   Specimen  Description   Final    BLOOD BLOOD RIGHT FOREARM Performed at [facility], [address] [place], [place] [address]    Special Requests   Final    BOTTLES DRAWN AEROBIC AND ANAEROBIC Blood Culture adequate volume Performed at [facility], [address] [place], [place] [address]    Culture   Final    NO GROWTH 3 DAYS Performed at [facility], [place] [address] [place], [place] [address]    Report Status PENDING  Incomplete  Urine culture     Status: None   Collection Time: 09/21/19  4:20 PM   Specimen: In/Out Cath Urine  Result Value Ref Range Status   Specimen Description   Final    IN/OUT CATH URINE Performed at [facility], [address] [place], [place] [address]    Special Requests   Final    NONE Performed at [facility], [address] [place], [place] [address]    Culture   Final    NO GROWTH Performed at [facility], [place] [address] [place], [place] [address]    Report Status 09/23/2019 FINAL  Final  SARS Coronavirus 2 by RT PCR (hospital order, performed in [facility] hospital lab) Nasopharyngeal Nasopharyngeal Swab     Status: None   Collection Time: 09/21/19  4:20 PM   Specimen: Nasopharyngeal Swab  Result Value Ref Range Status   SARS Coronavirus 2 NEGATIVE NEGATIVE Final    Comment: (NOTE) SARS-CoV-2 target nucleic acids are NOT DETECTED. The SARS-CoV-2 RNA is generally detectable in upper and lower respiratory specimens during the acute phase of infection.  The lowest concentration of SARS-CoV-2 viral copies this assay can detect is 250 copies / mL. A negative result does not preclude SARS-CoV-2 infection and should not be used as the sole basis for treatment or other patient management decisions.  A negative result may occur with improper specimen collection / handling, submission of specimen other than nasopharyngeal swab, presence of viral mutation(s) within the areas targeted by this  assay, and inadequate number of viral copies (<250 copies / mL). A negative result must be combined with clinical observations, patient history, and epidemiological information. Fact Sheet for Patients:   StrictlyIdeas.no Fact Sheet for Healthcare Providers: BankingDealers.co.za This test is not yet approved or cleared  by the [place] FDA and has been authorized for detection and/or diagnosis of SARS-CoV-2 by FDA under an Emergency Use Authorization (EUA).  This EUA will remain in effect (meaning this test can be used) for the duration of the COVID-19 declaration under Section 564(b)(1) of the Act, 21 U.S.C. section 360bbb-3(b)(1), unless the authorization is terminated or revoked sooner. Performed at [facility], [address] [place], [place] [address]   MRSA PCR Screening     Status: None   Collection Time: 09/22/19  2:47 AM   Specimen: Nasopharyngeal  Result Value Ref Range Status   MRSA by PCR NEGATIVE NEGATIVE Final    Comment:        The GeneXpert MRSA Assay (FDA approved for NASAL specimens only), is one component of a comprehensive MRSA colonization surveillance program. It is not intended to diagnose MRSA infection nor to guide or monitor treatment for MRSA infections. Performed at [facility], [place] [address] [place], [place] [address]   Body fluid culture     Status: None   Collection Time: 09/22/19  4:20 AM   Specimen: Body Fluid  Result Value Ref Range Status   Specimen Description FLUID EMPYEMA  Final   Special Requests NONE  Final   Gram Stain   Final    RARE WBC PRESENT, PREDOMINANTLY PMN ABUNDANT GRAM NEGATIVE RODS FEW GRAM POSITIVE COCCI RARE GRAM POSITIVE RODS Performed at [facility], [place] [address] [place], [place] [address]    Culture ABUNDANT STREPTOCOCCUS CONSTELLATUS  Final   Report Status 09/24/2019 FINAL  Final   Organism ID, Bacteria STREPTOCOCCUS CONSTELLATUS   Final      Susceptibility   Streptococcus constellatus - MIC*    PENICILLIN INTERMEDIATE Intermediate     CEFTRIAXONE 1 SENSITIVE Sensitive     ERYTHROMYCIN <=0.12 SENSITIVE Sensitive     LEVOFLOXACIN 0.5 SENSITIVE Sensitive     VANCOMYCIN 0.5 SENSITIVE Sensitive     * ABUNDANT STREPTOCOCCUS CONSTELLATUS         Radiology Studies: DG Chest 1 View  Result Date: 09/23/2019 CLINICAL DATA:  Hypoxia EXAM: CHEST  1 VIEW COMPARISON:  Sep 22, 2019 chest radiograph and chest CT Sep 21, 2019 FINDINGS: Endotracheal tube tip is 5.9 cm above the carina. Nasogastric tube tip and side port are below the diaphragm. Chest tube is present on the left, stable from 1 day prior. No evident pneumothorax. Ill-defined airspace opacity remains in the left lower lung region with consolidation medially. Small left pleural effusion evident. Right lung is clear. Heart is mildly enlarged with pulmonary vascularity normal. No adenopathy. No bone lesions. IMPRESSION: Tube and catheter positions as described without pneumothorax. There has been apparent partial drainage of empyema on the left with ill-defined opacity remaining in the left lower lung region  with small pleural effusion. Consolidation medial left base present, likely due to combination of atelectasis and suspected pneumonia. Right lung clear.  Stable cardiac prominence. Electronically Signed   By: Lowella Grip III M.D.   On: 09/23/2019 08:38        Scheduled Meds: . chlorhexidine gluconate (MEDLINE KIT)  15 mL Mouth Rinse BID  . Chlorhexidine Gluconate Cloth  6 each Topical Daily  . docusate  100 mg Oral BID  . enoxaparin (LOVENOX) injection  40 mg Subcutaneous Q24H  . feeding supplement (ENSURE ENLIVE)  237 mL Oral TID BM  . fentaNYL (SUBLIMAZE) injection  50 mcg Intravenous Once  . folic acid  1 mg Oral Daily  . insulin aspart  0-9 Units Subcutaneous Q4H  . mouth rinse  15 mL Mouth Rinse BID  . multivitamin with minerals  1 tablet Oral Daily   . nicotine  21 mg Transdermal Daily  . [START ON 09/25/2019] pantoprazole  40 mg Oral Daily  . polyethylene glycol  17 g Oral Daily  . thiamine  100 mg Oral Daily   Or  . thiamine  100 mg Intravenous Daily   Continuous Infusions: . cefTRIAXone (ROCEPHIN)  IV 2 g (09/24/19 0943)     LOS: 3 days    Time spent: 40 minutes    Irine Seal, MD [facility]   To contact the attending provider between 7A-7P or the covering provider during after hours 7P-7A, please log into the web site [email or web address] and access using universal [place] password for that web site. If you do not have the password, please call the hospital operator.  09/24/2019, 8:55 PM

## 2019-09-24 NOTE — Progress Notes (Signed)
  Subjective:  delirium  Objective: Vital signs in last 24 hours: Temp:  [97.7 F (36.5 C)-97.9 F (36.6 C)] 97.7 F (36.5 C) (05/26 0644) Pulse Rate:  [65-93] 78 (05/26 0644) Cardiac Rhythm: Normal sinus rhythm (05/26 0700) Resp:  [19-36] 26 (05/26 0644) BP: (99-144)/(33-113) 131/54 (05/26 0644) SpO2:  [87 %-100 %] 94 % (05/26 0644) Weight:  [81.6 kg-81.9 kg] 81.6 kg (05/26 0500)  Hemodynamic parameters for last 24 hours:    Intake/Output from previous day: 05/25 0701 - 05/26 0700 In: 1252.5 [I.V.:52.8; IV Piggyback:1199.8] Out: 1595 [Urine:1425; Chest Tube:170] Intake/Output this shift: No intake/output data recorded.  Heart: regular rate and rhythm, S1, S2 normal, no murmur Lungs: clear to auscultation bilaterally chest tube draining purulent fluid. 170 cc/24 hrs recorded  Lab Results: Recent Labs    09/23/19 0242 09/24/19 0241  WBC 19.9* 16.9*  HGB 8.8* 9.4*  HCT 27.0* 28.6*  PLT 360 389   BMET:  Recent Labs    09/23/19 0242 09/24/19 0241  NA 137 137  K 4.8 3.7  CL 104 101  CO2 24 25  GLUCOSE 209* 92  BUN 20 16  CREATININE 0.54* 0.64  CALCIUM 8.4* 8.0*    PT/INR:  Recent Labs    09/21/19 1618  LABPROT 16.4*  INR 1.4*   ABG    Component Value Date/Time   PHART 7.338 (L) 09/22/2019 0537   HCO3 25.6 09/22/2019 0537   TCO2 27 09/22/2019 0537   ACIDBASEDEF 1.0 09/22/2019 0537   O2SAT 89.0 09/22/2019 0537   CBG (last 3)  Recent Labs    09/23/19 2355 09/24/19 0359 09/24/19 0758  GLUCAP 85 87 84    Assessment/Plan:  Right empyema with sepsis and respiratory failure. Clinically improved with catheter drainage with resolution of fever, improving leukocytosis and extubated. Culture growing Strep Constellatus. I would continue this tube until no drainage is present and it can be irrigated with sterile saline as needed to prevent occlusion with debris. Follow up CT at some point although no urgency as long as he is improving and CXR looked ok  yesterday. CT is going to show some residual cavity/fluid and pneumonia in LLL. This will take a long time to resolve.   LOS: 3 days    Alleen Borne 09/24/2019

## 2019-09-25 ENCOUNTER — Inpatient Hospital Stay (HOSPITAL_COMMUNITY): Payer: 59

## 2019-09-25 DIAGNOSIS — A409 Streptococcal sepsis, unspecified: Secondary | ICD-10-CM

## 2019-09-25 DIAGNOSIS — R6520 Severe sepsis without septic shock: Secondary | ICD-10-CM

## 2019-09-25 DIAGNOSIS — G9341 Metabolic encephalopathy: Secondary | ICD-10-CM

## 2019-09-25 DIAGNOSIS — F1010 Alcohol abuse, uncomplicated: Secondary | ICD-10-CM

## 2019-09-25 LAB — CBC
HCT: 28.5 % — ABNORMAL LOW (ref 39.0–52.0)
Hemoglobin: 9.4 g/dL — ABNORMAL LOW (ref 13.0–17.0)
MCH: 31.3 pg (ref 26.0–34.0)
MCHC: 33 g/dL (ref 30.0–36.0)
MCV: 95 fL (ref 80.0–100.0)
Platelets: 462 10*3/uL — ABNORMAL HIGH (ref 150–400)
RBC: 3 MIL/uL — ABNORMAL LOW (ref 4.22–5.81)
RDW: 13.9 % (ref 11.5–15.5)
WBC: 16.1 10*3/uL — ABNORMAL HIGH (ref 4.0–10.5)
nRBC: 0 % (ref 0.0–0.2)

## 2019-09-25 LAB — GLUCOSE, CAPILLARY
Glucose-Capillary: 107 mg/dL — ABNORMAL HIGH (ref 70–99)
Glucose-Capillary: 116 mg/dL — ABNORMAL HIGH (ref 70–99)
Glucose-Capillary: 147 mg/dL — ABNORMAL HIGH (ref 70–99)
Glucose-Capillary: 150 mg/dL — ABNORMAL HIGH (ref 70–99)
Glucose-Capillary: 171 mg/dL — ABNORMAL HIGH (ref 70–99)

## 2019-09-25 LAB — BASIC METABOLIC PANEL
Anion gap: 7 (ref 5–15)
BUN: 13 mg/dL (ref 6–20)
CO2: 27 mmol/L (ref 22–32)
Calcium: 7.7 mg/dL — ABNORMAL LOW (ref 8.9–10.3)
Chloride: 101 mmol/L (ref 98–111)
Creatinine, Ser: 0.57 mg/dL — ABNORMAL LOW (ref 0.61–1.24)
GFR calc Af Amer: >60 mL/min (ref >60)
GFR calc non Af Amer: >60 mL/min (ref >60)
Glucose, Bld: 101 mg/dL — ABNORMAL HIGH (ref 70–99)
Potassium: 3.8 mmol/L (ref 3.5–5.1)
Sodium: 135 mmol/L (ref 135–145)

## 2019-09-25 LAB — MAGNESIUM: Magnesium: 1.8 mg/dL (ref 1.7–2.4)

## 2019-09-25 MED ORDER — MAGNESIUM SULFATE 2 GM/50ML IV SOLN
2.0000 g | Freq: Once | INTRAVENOUS | Status: AC
  Administered 2019-09-25: 2 g via INTRAVENOUS
  Filled 2019-09-25: qty 50

## 2019-09-25 MED ORDER — INSULIN ASPART 100 UNIT/ML ~~LOC~~ SOLN
0.0000 [IU] | Freq: Three times a day (TID) | SUBCUTANEOUS | Status: DC
  Administered 2019-09-25 (×2): 1 [IU] via SUBCUTANEOUS
  Administered 2019-09-26: 2 [IU] via SUBCUTANEOUS

## 2019-09-25 NOTE — Plan of Care (Signed)
  Problem: Education: Goal: Knowledge of General Education information will improve Description Including pain rating scale, medication(s)/side effects and non-pharmacologic comfort measures Outcome: Progressing   

## 2019-09-25 NOTE — Progress Notes (Signed)
PROGRESS NOTE    Victor Torres  OVZ:[id number] DOB: Mar 06, 1971 DOA: 09/21/2019 PCP: Patient, No Pcp Per   Chief Complaint  Patient presents with  . Shortness of Breath    Brief Narrative:  49 y.o. M with PMH of ETOH and tobacco abuse who presented to [place] [facility] with shortness of breath.   He initially went to urgent care on 5/17 and had an outpatient CT showing possible empyema, he was scheduled for a follow up appointment, but became increasingly SOB, so presented to the ED with fever, chills and cough productive of purulent sputum.  He was requiring 5L [place] oxygen and CTA significant for 14x7.8cm thick walled cavitary lesion in the Mid-lower L lung. He was treated with Vanc, cefepime, flagyl and Decadron.  IR consulted for chest tube, asked that PCCM evaluate overnight.    He continued to worsen and required transfer to the ICU, intubation and placement of chest tube. CT surgery also consulted and following.   Assessment & Plan:   Principal Problem:   Sepsis ([place]) Active Problems:   Acute respiratory failure with hypoxia (HCC)   Empyema (HCC)   Chest pain   Hyponatremia   Malnutrition of moderate degree   Alcohol abuse   Tobacco abuse   Hypokalemia  1 sepsis secondary to left empyema/acute hypoxic respiratory failure secondary to left empyema/left pneumonia, POA CT chest which was done on admission was concerning for thick-walled cavitary lesion in the mid lower left lung/empyema.  Patient's condition worsened was transferred to the ICU intubated and chest tube placed.  Cultures from empyema positive for Streptococcus constellatus.  Chest tube drained about 120 cc over the past 24 hours.  Patient afebrile.  Leukocytosis trending down.  CT surgery consulted patient was seen by Dr. Caffie Pinto and feels no surgical intervention is indicated at this time and to continue with pigtail catheter in addition to IV antibiotics.  Will need repeat CT chest in the few days/probably 48 hours.   Continue irrigation of chest tube/pigtail catheter with sterile saline as needed to prevent occlusion with debris.  IV antibiotics have been narrowed down to IV Rocephin.  Pulmonary and CT surgery following.  Appreciate input and recommendations.  2.  Alcohol abuse Patient required Precedex drip which is subsequently been discontinued.  Continue thiamine, folic acid, multivitamin.  3.  Metabolic encephalopathy Likely secondary to problem #1.  Improved.  Continue treatment as in #1.  Follow.  4.  Hypovolemic hyponatremia Likely secondary to poor oral intake.  Improving with hydration.  5.  Mild hypokalemia Resolved.  Potassium at 3.8.  6.  Tobacco abuse Nicotine patch.  7.  Malnutrition of moderate degree Diet advanced to regular diet which patient is tolerating.  Nutritional supplementation.     DVT prophylaxis: Lovenox Code Status: Full Family Communication: Updated patient.  No family at bedside. Disposition:   Status is: Inpatient    Dispo: The patient is from: Home              Anticipated d/c is to: Likely home with home health versus SNF              Anticipated d/c date is: To be determined.              Patient currently with left chest tube placement currently draining, on IV antibiotics, being followed by CT surgery and PCCM.        Consultants:   PCCM: Dr.Aljishi 09/22/2019  Cardiothoracic surgery: Dr. Cyndia Bent 09/22/2019  Procedures:  CT  angio chest 09/21/2019---A 14.0 cm x 7.8 cm thick walled (approximately 1.3 cm) cavitary lesion within the anterolateral aspect of the mid and lower left lung, increased in size when compared to the prior exam. This is likely consistent with an evolving empyema.  Chest x-ray 09/21/2019, 09/22/2019, 09/23/2019  Abdominal ultrasound 09/22/2019  2D echo 09/22/2019  ETT 09/22/2019  Left chest tube placement 09/22/2019--per Dr. Skipper Cliche PCCM  Micro Data:  5/23 Sars-Cov-2>>negative 5/23 MRSA screen-negative 5/23 Urine  culture>> 5/23 Blood culture>> 5/23 Body fluid culture (empyema)-ABUNDANT STREPTOCOCCUS CONSTELLatus.   Antimicrobials:  IV Rocephin 09/24/2019  IV cefepime 09/21/2019>>>> 09/24/2019  IV Flagyl 09/21/2019>>>> 09/24/2019  IV vancomycin 09/21/2019 >>>>> 09/24/2019   Subjective: Patient sitting up in bed.  More alert today.  Some chest pain around chest tube site.  Patient states overall he is feeling better.  Feels shortness of breath is slowly improving.  Tolerating current diet.   Objective: Vitals:   09/24/19 1010 09/24/19 1610 09/24/19 2033 09/25/19 0409  BP: (!) 134/52 (!) 162/45 (!) 125/53 (!) 142/49  Pulse: 73 82 76 78  Resp: '20 15 17 17  ' Temp: 97.6 F (36.4 C) 98.2 F (36.8 C) 97.8 F (36.6 C) 98.5 F (36.9 C)  TempSrc: Oral Oral    SpO2: 91% 93% 98% 96%  Weight:      Height:        Intake/Output Summary (Last 24 hours) at 09/25/2019 1401 Last data filed at 09/25/2019 0600 Gross per 24 hour  Intake 100 ml  Output 1020 ml  Net -920 ml   Filed Weights   09/23/19 0500 09/23/19 2241 09/24/19 0500  Weight: 74.5 kg 81.9 kg 81.6 kg    Examination:  General exam: NAD Respiratory system: Some coarse rhonchorous breath sounds in the left base.  No wheezing.  Normal respiratory effort.   Cardiovascular system: Regular rate rhythm no murmurs rubs or gallops.  No JVD.  No lower extremity edema.  Chest tube draining purulent fluid. Gastrointestinal system: Abdomen is soft, nontender, nondistended, positive bowel sounds.  No rebound.  No guarding.  Central nervous system: Alert and oriented. No focal neurological deficits. Extremities: Symmetric 5 x 5 power. Skin: No rashes, lesions or ulcers Psychiatry: Judgement and insight appear normal. Mood & affect appropriate.     Data Reviewed: I have personally reviewed following labs and imaging studies  CBC: Recent Labs  Lab 09/21/19 1618 09/22/19 0537 09/23/19 0242 09/24/19 0241 09/25/19 0200  WBC 21.4*  --  19.9*  16.9* 16.1*  NEUTROABS 18.4*  --   --   --   --   HGB 11.1* 10.2* 8.8* 9.4* 9.4*  HCT 31.7* 30.0* 27.0* 28.6* 28.5*  MCV 92.7  --  97.1 95.0 95.0  PLT 406*  --  360 389 462*    Basic Metabolic Panel: Recent Labs  Lab 09/22/19 0607 09/22/19 1037 09/22/19 1316 09/22/19 1635 09/23/19 0242 09/24/19 0241 09/25/19 0200  NA 134* 135  --   --  137 137 135  K 4.1 4.4  --   --  4.8 3.7 3.8  CL 104 105  --   --  104 101 101  CO2 23 24  --   --  '24 25 27  ' GLUCOSE 211* 188*  --   --  209* 92 101*  BUN 11 11  --   --  '20 16 13  ' CREATININE 0.56* 0.55*  --   --  0.54* 0.64 0.57*  CALCIUM 8.2* 8.2*  --   --  8.4* 8.0* 7.7*  MG 2.1  --  2.1 2.4 2.1  --  1.8  PHOS 3.6  --  3.5 3.8 3.5  --   --     GFR: Estimated Creatinine Clearance: 126.2 mL/min (A) (by C-G formula based on SCr of 0.57 mg/dL (L)).  Liver Function Tests: Recent Labs  Lab 09/21/19 1618  AST 43*  ALT 53*  ALKPHOS 134*  BILITOT 1.5*  PROT 7.8  ALBUMIN 2.2*    CBG: Recent Labs  Lab 09/24/19 2035 09/24/19 2349 09/25/19 0406 09/25/19 0809 09/25/19 1219  GLUCAP 92 134* 107* 116* 147*     Recent Results (from the past 240 hour(s))  Blood Culture (routine x 2)     Status: None (Preliminary result)   Collection Time: 09/21/19  4:19 PM   Specimen: BLOOD  Result Value Ref Range Status   Specimen Description   Final    BLOOD LEFT ANTECUBITAL Performed at [facility], [address] [place], [place] [address]    Special Requests   Final    BOTTLES DRAWN AEROBIC AND ANAEROBIC Blood Culture adequate volume Performed at [facility], [address] [place], [place] [address]    Culture   Final    NO GROWTH 4 DAYS Performed at [facility], [place] [address], [place], [place] [address]    Report Status PENDING  Incomplete  Blood Culture (routine x 2)     Status: None (Preliminary result)   Collection Time: 09/21/19  4:20 PM   Specimen: BLOOD  Result Value Ref Range Status    Specimen Description   Final    BLOOD BLOOD RIGHT FOREARM Performed at [facility], [address] [place], [place] [address]    Special Requests   Final    BOTTLES DRAWN AEROBIC AND ANAEROBIC Blood Culture adequate volume Performed at [facility], [address] [place], [place] [address]    Culture   Final    NO GROWTH 4 DAYS Performed at [facility], [place] [address], [place], [place] [address]    Report Status PENDING  Incomplete  Urine culture     Status: None   Collection Time: 09/21/19  4:20 PM   Specimen: In/Out Cath Urine  Result Value Ref Range Status   Specimen Description   Final    IN/OUT CATH URINE Performed at [facility], [address] [place], [place] [address]    Special Requests   Final    NONE Performed at [facility], [address] [place], [place] [address]    Culture   Final    NO GROWTH Performed at [facility], [place] [address], [place], [place] [address]    Report Status 09/23/2019 FINAL  Final  SARS Coronavirus 2 by RT PCR (hospital order, performed in [facility] hospital lab) Nasopharyngeal Nasopharyngeal Swab     Status: None   Collection Time: 09/21/19  4:20 PM   Specimen: Nasopharyngeal Swab  Result Value Ref Range Status   SARS Coronavirus 2 NEGATIVE NEGATIVE Final    Comment: (NOTE) SARS-CoV-2 target nucleic acids are NOT DETECTED. The SARS-CoV-2 RNA is generally detectable in upper and lower respiratory specimens during the acute phase of infection. The lowest concentration of SARS-CoV-2 viral copies this assay can detect is 250 copies / mL. A negative result does not preclude SARS-CoV-2 infection and should not be used as  the sole basis for treatment or other patient management decisions.  A negative result may occur with improper specimen collection / handling, submission of specimen other than nasopharyngeal swab, presence of viral mutation(s) within the areas targeted by  this assay, and inadequate number of viral copies (<250 copies / mL). A negative result must be combined with clinical observations, patient history, and epidemiological information. Fact Sheet for Patients:   StrictlyIdeas.no Fact Sheet for Healthcare Providers: BankingDealers.co.za This test is not yet approved or cleared  by the [place] FDA and has been authorized for detection and/or diagnosis of SARS-CoV-2 by FDA under an Emergency Use Authorization (EUA).  This EUA will remain in effect (meaning this test can be used) for the duration of the COVID-19 declaration under Section 564(b)(1) of the Act, 21 U.S.C. section 360bbb-3(b)(1), unless the authorization is terminated or revoked sooner. Performed at [facility], [address] [place], [place] [address]   MRSA PCR Screening     Status: None   Collection Time: 09/22/19  2:47 AM   Specimen: Nasopharyngeal  Result Value Ref Range Status   MRSA by PCR NEGATIVE NEGATIVE Final    Comment:        The GeneXpert MRSA Assay (FDA approved for NASAL specimens only), is one component of a comprehensive MRSA colonization surveillance program. It is not intended to diagnose MRSA infection nor to guide or monitor treatment for MRSA infections. Performed at [facility], [place] [address], [place], [place] [address]   Body fluid culture     Status: None   Collection Time: 09/22/19  4:20 AM   Specimen: Body Fluid  Result Value Ref Range Status   Specimen Description FLUID EMPYEMA  Final   Special Requests NONE  Final   Gram Stain   Final    RARE WBC PRESENT, PREDOMINANTLY PMN ABUNDANT GRAM NEGATIVE RODS FEW GRAM POSITIVE COCCI RARE GRAM POSITIVE RODS Performed at [facility], [place] [address] [place], [place] [address]    Culture ABUNDANT STREPTOCOCCUS CONSTELLATUS  Final   Report Status 09/24/2019 FINAL  Final   Organism ID, Bacteria STREPTOCOCCUS  CONSTELLATUS  Final      Susceptibility   Streptococcus constellatus - MIC*    PENICILLIN INTERMEDIATE Intermediate     CEFTRIAXONE 1 SENSITIVE Sensitive     ERYTHROMYCIN <=0.12 SENSITIVE Sensitive     LEVOFLOXACIN 0.5 SENSITIVE Sensitive     VANCOMYCIN 0.5 SENSITIVE Sensitive     * ABUNDANT STREPTOCOCCUS CONSTELLATUS         Radiology Studies: DG Chest Port 1 View  Result Date: 09/25/2019 CLINICAL DATA:  Chest tube placement EXAM: PORTABLE CHEST 1 VIEW COMPARISON:  Sep 23, 2019 FINDINGS: Normal cardiac silhouette. LEFT chest tube in place without pneumothorax. LEFT basilar atelectasis noted. Small LEFT effusion. RIGHT lung clear. IMPRESSION: 1. LEFT chest tube in place.  No pneumothorax. 2.  LEFT basilar atelectasis and effusion. Electronically Signed   By: Suzy Bouchard M.D.   On: 09/25/2019 08:54        Scheduled Meds: . chlorhexidine gluconate (MEDLINE KIT)  15 mL Mouth Rinse BID  . Chlorhexidine Gluconate Cloth  6 each Topical Daily  . docusate  100 mg Oral BID  . enoxaparin (LOVENOX) injection  40 mg Subcutaneous Q24H  . feeding supplement (ENSURE ENLIVE)  237 mL Oral TID BM  . fentaNYL (SUBLIMAZE) injection  50 mcg Intravenous Once  . folic acid  1 mg Oral Daily  . insulin aspart  0-9 Units Subcutaneous TID WC  . mouth rinse  15 mL Mouth Rinse BID  . multivitamin with minerals  1 tablet Oral Daily  . nicotine  21 mg Transdermal Daily  . pantoprazole  40 mg Oral Daily  . polyethylene glycol  17 g Oral Daily  . thiamine  100 mg Oral Daily   Or  . thiamine  100 mg Intravenous Daily   Continuous Infusions: . cefTRIAXone (ROCEPHIN)  IV 2 g (09/25/19 0958)     LOS: 4 days    Time spent: 40 minutes    Irine Seal, MD [facility]   To contact the attending provider between 7A-7P or the covering provider during after hours 7P-7A, please log into the web site [email or web address] and access using universal [place] password for that web site. If you do  not have the password, please call the hospital operator.  09/25/2019, 2:01 PM

## 2019-09-25 NOTE — Progress Notes (Signed)
NAME:  Victor Torres, MRN:  [id number], DOB:  1970/06/15, LOS: 4 ADMISSION DATE:  09/21/2019, CONSULTATION DATE:  09/25/19 REFERRING MD:  Hospitalist, CHIEF COMPLAINT:  empyema   Brief History   49 y.o. M with PMH of ETOH and tobacco abuse who developed cough and dysnpea approximately one week ago, admitted with L-sided empyema and required transfer to ICU, intubation and chest tube.  History of present illness   49 y.o. M with PMH of ETOH and tobacco abuse who presented to [place] [facility] ED with shortness of breath.   He initially went to urgent care on 5/17 and had an outpatient CT showing possible empyema, he was scheduled for a follow up appointment, but became increasingly SOB, so presented to the ED with fever, chills and cough productive of purulent sputum.  He was requiring 5L [place] oxygen and CTA significant for 14x7.8cm thick walled cavitary lesion in the Mid-lower L lung. He was treated with Vanc, cefepime, flagyl and Decadron.  IR consulted for chest tube, asked that PCCM evaluate overnight.    He continued to worsen and required transfer to the ICU, intubation and placement of chest tube.  Past Medical History   has a past medical history of Alcohol abuse., per notes he quit one year ago, but did three shots before presenting to the ED.   Significant Hospital Events   5/23 admitted to hospitalists 5/24 PCCM consult, transfer to ICU and intubation  Consults:  PCCM  Procedures:  5/24 ETT 5/24 L chest tube  Significant Diagnostic Tests:  5/23 CTA chest>>A 14.0 cm x 7.8 cm thick walled (approximately 1.3 cm) cavitary lesion within the anterolateral aspect of the mid and lower left lung, increased in size when compared to the prior exam. This is likely consistent with an evolving empyema.  Micro Data:  5/23 Sars-Cov-2>>negative 5/23 MRSA screen-negative 5/23 Urine culture>> 5/23 Blood culture>> 5/23 Body fluid culture (empyema)-ABUNDANT STREPTOCOCCUS CONSTELLATUS    Antimicrobials:  Vancomycin 5/23-5/25 Cefepime 5/23-5/25 Flagyl 5/23-5/25 Ceftriaxone 5/26>>  Interim history/subjective:  Comfortable in bed Fully awake and interactive today Some discomfort around chest tube site  Objective   Blood pressure (!) 142/49, pulse 78, temperature 98.5 F (36.9 C), resp. rate 17, height 6\' 1"  (1.854 m), weight 81.6 kg, SpO2 96 %.        Intake/Output Summary (Last 24 hours) at 09/25/2019 09/27/2019 Last data filed at 09/25/2019 0600 Gross per 24 hour  Intake 325 ml  Output 1070 ml  Net -745 ml   Filed Weights   09/23/19 0500 09/23/19 2241 09/24/19 0500  Weight: 74.5 kg 81.9 kg 81.6 kg    GEN: In no acute distress  HEENT: Moist oral mucosa CV: S1-S2 appreciated PULM: Good air entry bilaterally GI: Soft, +BS  Leukocytosis-continues to improve Chest x-ray reviewed by myself showing improvement in findings-partial drainage of noted empyema-will repeat today  BUN 16 creatinine 0.64 Resolved Hospital Problem list     Assessment & Plan:   Acute hypoxic respiratory failure secondary to L empyema and acute metabolic encephalopathy -Monitor closely -Oxygen supplementation as needed - Monitor chest tube output, will obtain chest CT in a.m. 5/28 - Dr. 6/28 has evaluated and thinks we can get by without decortication -Tube appears patent and continues to drain actively  ETOH abuse Sounds like had mostly quit prior to admission -continue thiamine, MV, folic acid  Metabolic encephalopathy  Empyema -Significant for Streptococcus constellatus -Organism is treatable with Rocephin-Rocephin was started 09/24/2019  Chest tube output 120 cc of purulent  fluid  Switched to ceftriaxone 2 g daily  Best practice:  Diet: Tolerating p.o. Pain/Anxiety/Delirium protocol (if indicated):tylenol, CIWA protocol lorazepam VAP protocol (if indicated): not indicated at present DVT prophylaxis: lovenox GI prophylaxis: protonix Glucose control: SSI Mobility:  bed rest Code Status: full code  Family Communication: Patient fully aware of ongoing events Disposition: med surg  Sherrilyn Rist, MD [place] PCCM Pager: [phone]

## 2019-09-26 ENCOUNTER — Inpatient Hospital Stay (HOSPITAL_COMMUNITY): Payer: 59

## 2019-09-26 LAB — CBC
HCT: 29.1 % — ABNORMAL LOW (ref 39.0–52.0)
Hemoglobin: 9.5 g/dL — ABNORMAL LOW (ref 13.0–17.0)
MCH: 31.4 pg (ref 26.0–34.0)
MCHC: 32.6 g/dL (ref 30.0–36.0)
MCV: 96 fL (ref 80.0–100.0)
Platelets: 469 10*3/uL — ABNORMAL HIGH (ref 150–400)
RBC: 3.03 MIL/uL — ABNORMAL LOW (ref 4.22–5.81)
RDW: 13.7 % (ref 11.5–15.5)
WBC: 16.1 10*3/uL — ABNORMAL HIGH (ref 4.0–10.5)
nRBC: 0 % (ref 0.0–0.2)

## 2019-09-26 LAB — BASIC METABOLIC PANEL
Anion gap: 7 (ref 5–15)
BUN: 12 mg/dL (ref 6–20)
CO2: 26 mmol/L (ref 22–32)
Calcium: 7.7 mg/dL — ABNORMAL LOW (ref 8.9–10.3)
Chloride: 106 mmol/L (ref 98–111)
Creatinine, Ser: 0.61 mg/dL (ref 0.61–1.24)
GFR calc Af Amer: >60 mL/min (ref >60)
GFR calc non Af Amer: >60 mL/min (ref >60)
Glucose, Bld: 147 mg/dL — ABNORMAL HIGH (ref 70–99)
Potassium: 3.7 mmol/L (ref 3.5–5.1)
Sodium: 139 mmol/L (ref 135–145)

## 2019-09-26 LAB — CULTURE, BLOOD (ROUTINE X 2)
Culture: NO GROWTH
Culture: NO GROWTH
Special Requests: ADEQUATE
Special Requests: ADEQUATE

## 2019-09-26 LAB — MAGNESIUM: Magnesium: 2 mg/dL (ref 1.7–2.4)

## 2019-09-26 LAB — GLUCOSE, CAPILLARY
Glucose-Capillary: 84 mg/dL (ref 70–99)
Glucose-Capillary: 122 mg/dL — ABNORMAL HIGH (ref 70–99)
Glucose-Capillary: 89 mg/dL (ref 70–99)
Glucose-Capillary: 189 mg/dL — ABNORMAL HIGH (ref 70–99)
Glucose-Capillary: 95 mg/dL (ref 70–99)

## 2019-09-26 MED ORDER — CEFDINIR 300 MG PO CAPS
300.0000 mg | ORAL_CAPSULE | Freq: Two times a day (BID) | ORAL | Status: DC
  Administered 2019-09-26 (×2): 300 mg via ORAL
  Filled 2019-09-26 (×3): qty 1

## 2019-09-26 MED ORDER — IOHEXOL 300 MG/ML  SOLN
75.0000 mL | Freq: Once | INTRAMUSCULAR | Status: AC | PRN
  Administered 2019-09-26: 75 mL via INTRAVENOUS

## 2019-09-26 MED ORDER — MELATONIN 5 MG PO TABS
5.0000 mg | ORAL_TABLET | Freq: Every evening | ORAL | Status: DC | PRN
  Administered 2019-09-26 (×2): 5 mg via ORAL
  Filled 2019-09-26 (×2): qty 1

## 2019-09-26 NOTE — Progress Notes (Signed)
NAME:  Victor Torres, MRN:  [id number], DOB:  1970-07-12, LOS: 5 ADMISSION DATE:  09/21/2019, CONSULTATION DATE:  09/26/19 REFERRING MD:  Hospitalist, CHIEF COMPLAINT:  empyema   Brief History   49 y.o. M with PMH of ETOH and tobacco abuse who developed cough and dysnpea approximately one week ago, admitted with L-sided empyema and required transfer to ICU, intubation and chest tube.  History of present illness   49 y.o. M with PMH of ETOH and tobacco abuse who presented to [place] [facility] with shortness of breath.   He initially went to urgent care on 5/17 and had an outpatient CT showing possible empyema, he was scheduled for a follow up appointment, but became increasingly SOB, so presented to the ED with fever, chills and cough productive of purulent sputum.  He was requiring 5L [place] oxygen and CTA significant for 14x7.8cm thick walled cavitary lesion in the Mid-lower L lung. He was treated with Vanc, cefepime, flagyl and Decadron.  IR consulted for chest tube, asked that PCCM evaluate overnight.    He continued to worsen and required transfer to the ICU, intubation and placement of chest tube.  Past Medical History   has a past medical history of Alcohol abuse., per notes he quit one year ago, but did three shots before presenting to the ED.   Significant Hospital Events   5/23 admitted to hospitalists 5/24 PCCM consult, transfer to ICU and intubation  Consults:  PCCM  Procedures:  5/24 ETT 5/24 L chest tube  Significant Diagnostic Tests:  5/23 CTA chest>>A 14.0 cm x 7.8 cm thick walled (approximately 1.3 cm) cavitary lesion within the anterolateral aspect of the mid and lower left lung, increased in size when compared to the prior exam. This is likely consistent with an evolving empyema.  Micro Data:  5/23 Sars-Cov-2>>negative 5/23 MRSA screen-negative 5/23 Urine culture>> 5/23 Blood culture>> 5/23 Body fluid culture (empyema)-ABUNDANT STREPTOCOCCUS CONSTELLATUS    Antimicrobials:  Vancomycin 5/23-5/25 Cefepime 5/23-5/25 Flagyl 5/23-5/25 Ceftriaxone 5/26>>  Interim history/subjective:  Comfortable in bed Fully awake and interactive today Denies any pain or discomfort Wants to go home  Objective   Blood pressure (!) 129/94, pulse 69, temperature 97.7 F (36.5 C), temperature source Oral, resp. rate 17, height 6\' 1"  (1.854 m), weight 81.6 kg, SpO2 97 %.        Intake/Output Summary (Last 24 hours) at 09/26/2019 0956 Last data filed at 09/26/2019 0138 Gross per 24 hour  Intake 240 ml  Output 890 ml  Net -650 ml   Filed Weights   09/23/19 0500 09/23/19 2241 09/24/19 0500  Weight: 74.5 kg 81.9 kg 81.6 kg    GEN: In no acute distress  HEENT: Moist oral mucosa CV: S1-S2 appreciated PULM: Good air entry bilaterally GI: Soft, +BS  Leukocytosis-continues to improve Chest x-ray reviewed by myself showing improvement in findings-partial drainage of noted empyema-will repeat today  Chest x-ray shows improvement, CT scan of the chest pending at present  Resolved Hospital Problem list     Assessment & Plan:   Acute hypoxic respiratory failure secondary to L empyema and acute metabolic encephalopathy -Monitor closely -Oxygen supplementation as needed - Monitor chest tube output, will obtain chest CT in a.m. 5/28 - Dr. 6/28 has evaluated and thinks we can get by without decortication -Tube appears patent and continues to drain actively  ETOH abuse Sounds like had mostly quit prior to admission -continue thiamine, MV, folic acid  Metabolic encephalopathy  Empyema -Significant for Streptococcus constellatus -Organism  is treatable with Rocephin-Rocephin was started 09/24/2019  Chest tube output 120 cc of purulent fluid -CT scan of the chest is pending at the present time -Anticipate being able to discontinue chest tube if CT shows adequate drainage of empyema  Switched to Omnicef 300 p.o. twice daily for 21 days  Spoke with  Dr. Hewitt Shorts being able to discontinue chest tube if CT shows no collections Spoke with infectious disease and pharmacy-regarding antibiotic coverage  Best practice:  Diet: Tolerating p.o. Pain/Anxiety/Delirium protocol (if indicated):tylenol, CIWA protocol lorazepam VAP protocol (if indicated): not indicated at present DVT prophylaxis: lovenox GI prophylaxis: protonix Glucose control: SSI Mobility: bed rest Code Status: full code  Family Communication: Patient fully aware of ongoing events Disposition: med surg  Sherrilyn Rist, MD [place] PCCM Pager: [phone]

## 2019-09-26 NOTE — Plan of Care (Signed)
  Problem: Education: Goal: Knowledge of General Education information will improve Description Including pain rating scale, medication(s)/side effects and non-pharmacologic comfort measures Outcome: Progressing   

## 2019-09-26 NOTE — Progress Notes (Signed)
Called CT to check on CT scan ordered for today.

## 2019-09-26 NOTE — Progress Notes (Signed)
PROGRESS NOTE    Victor Torres  VOZ:[id number] DOB: 05-Jun-1970 DOA: 09/21/2019 PCP: Patient, No Pcp Per   Chief Complaint  Patient presents with  . Shortness of Breath    Brief Narrative:  49 y.o. M with PMH of ETOH and tobacco abuse who presented to [place] ED with shortness of breath.   He initially went to urgent care on 5/17 and had an outpatient CT showing possible empyema, he was scheduled for a follow up appointment, but became increasingly SOB, so presented to the ED with fever, chills and cough productive of purulent sputum.  He was requiring 5L [place] oxygen and CTA significant for 14x7.8cm thick walled cavitary lesion in the Mid-lower L lung. He was treated with Vanc, cefepime, flagyl and Decadron.  IR consulted for chest tube, asked that PCCM evaluate overnight.    He continued to worsen and required transfer to the ICU, intubation and placement of chest tube. CT surgery also consulted and following.   Assessment & Plan:   Principal Problem:   Sepsis ([place]) Active Problems:   Acute respiratory failure with hypoxia (HCC)   Empyema (HCC)   Chest pain   Hyponatremia   Malnutrition of moderate degree   Alcohol abuse   Tobacco abuse   Hypokalemia  1 sepsis secondary to left empyema/acute hypoxic respiratory failure secondary to left empyema/left pneumonia, POA CT chest which was done on admission was concerning for thick-walled cavitary lesion in the mid lower left lung/empyema.  Patient's condition worsened was transferred to the ICU intubated and chest tube placed.  Cultures from empyema positive for Streptococcus constellatus.  Chest tube drained about 140 cc over the past 24 hours.  Patient afebrile.  Leukocytosis trending down.  CT surgery consulted patient was seen by Dr. Caffie Pinto and feels no surgical intervention is indicated at this time and to continue with pigtail catheter in addition to IV antibiotics.  Repeat chest CT pending for today to see whether empyema has been  adequately drained.  Continue irrigation of chest tube/pigtail catheter with sterile saline as needed to prevent occlusion with debris.  IV antibiotics have been narrowed down to IV Rocephin.  IV Rocephin has been transitioned to oral Omnicef x21 days per pulmonary.  Pulmonary and CT surgery following.  Appreciate input and recommendations.  2.  Alcohol abuse Patient required Precedex drip which is subsequently been discontinued.  Continue thiamine, folic acid, multivitamin.  3.  Metabolic encephalopathy Likely secondary to problem #1.  Improved.  Continue treatment as in #1.  Follow.  4.  Hypovolemic hyponatremia Likely secondary to poor oral intake.  Resolved with hydration.   5.  Mild hypokalemia Repleted.  6.  Tobacco abuse Nicotine patch.  7.  Malnutrition of moderate degree Tolerating current regular diet.  Continue nutritional supplementation.   DVT prophylaxis: Lovenox Code Status: Full Family Communication: Updated patient.  No family at bedside. Disposition:   Status is: Inpatient    Dispo: The patient is from: Home              Anticipated d/c is to: Likely home with home health versus SNF              Anticipated d/c date is: To be determined.              Patient currently with left chest tube placement currently draining, on IV antibiotics, being followed by CT surgery and PCCM.        Consultants:   PCCM: Dr.Aljishi 09/22/2019  Cardiothoracic surgery:  Dr. Cyndia Bent 09/22/2019  Procedures:  CT angio chest 09/21/2019---A 14.0 cm x 7.8 cm thick walled (approximately 1.3 cm) cavitary lesion within the anterolateral aspect of the mid and lower left lung, increased in size when compared to the prior exam. This is likely consistent with an evolving empyema.  Chest x-ray 09/21/2019, 09/22/2019, 09/23/2019  Abdominal ultrasound 09/22/2019  2D echo 09/22/2019  ETT 09/22/2019  Left chest tube placement 09/22/2019--per Dr. Skipper Cliche PCCM  Micro Data:  5/23  Sars-Cov-2>>negative 5/23 MRSA screen-negative 5/23 Urine culture>> 5/23 Blood culture>> 5/23 Body fluid culture (empyema)-ABUNDANT STREPTOCOCCUS CONSTELLatus. CT chest pending 09/26/2019  Antimicrobials:  IV Rocephin 09/24/2019>>>>> 09/26/2019  IV cefepime 09/21/2019>>>> 09/24/2019  IV Flagyl 09/21/2019>>>> 09/24/2019  IV vancomycin 09/21/2019 >>>>> 09/24/2019  Omnicef 09/26/2019>>>>> 10/17/2019   Subjective: Patient laying in bed.  Much more alert today.  Denies any shortness of breath.  Some chest discomfort around chest tube site.  Tolerating current diet.  Asking whether he could go home today.    Objective: Vitals:   09/25/19 0409 09/25/19 1434 09/25/19 2117 09/26/19 0456  BP: (!) 142/49 (!) 136/49 (!) 159/52 (!) 129/94  Pulse: 78 75 79 69  Resp: _0 Temp: 98.5 F (36.9 C) 97.8 F (36.6 C) 97.9 F (36.6 C) 97.7 F (36.5 C)  TempSrc:  Oral Oral Oral  SpO2: 96% 98% 97% 97%  Weight:      Height:        Intake/Output Summary (Last 24 hours) at 09/26/2019 1003 Last data filed at 09/26/2019 0138 Gross per 24 hour  Intake 240 ml  Output 890 ml  Net -650 ml   Filed Weights   09/23/19 0500 09/23/19 2241 09/24/19 0500  Weight: 74.5 kg 81.9 kg 81.6 kg    Examination:  General exam: NAD Respiratory system: Some coarse rhonchorous breath sounds in the bases.  No wheezing.  Normal respiratory effort.  Speaking in full sentences.  Cardiovascular system: RRR no murmurs rubs or gallops.  No JVD.  No lower extremity edema.  Left-sided chest tube draining purulent fluid.  Gastrointestinal system: Abdomen is nontender, nondistended, soft, positive bowel sounds.  No rebound.  No guarding. Central nervous system: Alert and oriented. No focal neurological deficits. Extremities: Symmetric 5 x 5 power. Skin: No rashes, lesions or ulcers Psychiatry: Judgement and insight appear normal. Mood & affect appropriate.     Data Reviewed: I have personally reviewed following labs and  imaging studies  CBC: Recent Labs  Lab 09/21/19 1618 09/21/19 1618 09/22/19 0537 09/23/19 0242 09/24/19 0241 09/25/19 0200 09/26/19 0222  WBC 21.4*  --   --  19.9* 16.9* 16.1* 16.1*  NEUTROABS 18.4*  --   --   --   --   --   --   HGB 11.1*   < > 10.2* 8.8* 9.4* 9.4* 9.5*  HCT 31.7*   < > 30.0* 27.0* 28.6* 28.5* 29.1*  MCV 92.7  --   --  97.1 95.0 95.0 96.0  PLT 406*  --   --  360 389 462* 469*   < > = values in this interval not displayed.    Basic Metabolic Panel: Recent Labs  Lab 09/22/19 0607 09/22/19 0607 09/22/19 1037 09/22/19 1316 09/22/19 1635 09/23/19 0242 09/24/19 0241 09/25/19 0200 09/26/19 0222  NA 134*   < > 135  --   --  137 137 135 139  K 4.1   < > 4.4  --   --  4.8 3.7 3.8 3.7  CL  104   < > 105  --   --  104 101 101 106  CO2 23   < > 24  --   --  _0 GLUCOSE 211*   < > 188*  --   --  209* 92 101* 147*  BUN 11   < > 11  --   --  _1 CREATININE 0.56*   < > 0.55*  --   --  0.54* 0.64 0.57* 0.61  CALCIUM 8.2*   < > 8.2*  --   --  8.4* 8.0* 7.7* 7.7*  MG 2.1   < >  --  2.1 2.4 2.1  --  1.8 2.0  PHOS 3.6  --   --  3.5 3.8 3.5  --   --   --    < > = values in this interval not displayed.    GFR: Estimated Creatinine Clearance: 126.2 mL/min (by C-G formula based on SCr of 0.61 mg/dL).  Liver Function Tests: Recent Labs  Lab 09/21/19 1618  AST 43*  ALT 53*  ALKPHOS 134*  BILITOT 1.5*  PROT 7.8  ALBUMIN 2.2*    CBG: Recent Labs  Lab 09/25/19 0809 09/25/19 1219 09/25/19 1707 09/25/19 2223 09/26/19 0738  GLUCAP 116* 147* 150* 171* 84     Recent Results (from the past 240 hour(s))  Blood Culture (routine x 2)     Status: None   Collection Time: 09/21/19  4:19 PM   Specimen: BLOOD  Result Value Ref Range Status   Specimen Description   Final    BLOOD LEFT ANTECUBITAL Performed at [facility], [address] [place], [place] [address]    Special Requests   Final    BOTTLES DRAWN AEROBIC AND ANAEROBIC  Blood Culture adequate volume Performed at [facility], [address] [place], [place] [address]    Culture   Final    NO GROWTH 5 DAYS Performed at [facility], [place] [address], [place], [place] [address]    Report Status 09/26/2019 FINAL  Final  Blood Culture (routine x 2)     Status: None   Collection Time: 09/21/19  4:20 PM   Specimen: BLOOD  Result Value Ref Range Status   Specimen Description   Final    BLOOD BLOOD RIGHT FOREARM Performed at [facility], [address] [place], [place] [address]    Special Requests   Final    BOTTLES DRAWN AEROBIC AND ANAEROBIC Blood Culture adequate volume Performed at [facility], [address] [place], [place] [address]    Culture   Final    NO GROWTH 5 DAYS Performed at [facility], [place] [address], [place], [place] [address]    Report Status 09/26/2019 FINAL  Final  Urine culture     Status: None   Collection Time: 09/21/19  4:20 PM   Specimen: In/Out Cath Urine  Result Value Ref Range Status   Specimen Description   Final    IN/OUT CATH URINE Performed at [facility], [address] [place],  [address]    Special Requests   Final    NONE Performed at [facility], [address] [place], [place] [address]    Culture   Final    NO GROWTH Performed at [facility], [place] [address], [place], [place]  [address]    Report Status 09/23/2019 FINAL  Final  SARS Coronavirus 2 by RT PCR (hospital order, performed in [facility] hospital lab) Nasopharyngeal Nasopharyngeal Swab     Status: None   Collection Time: 09/21/19  4:20 PM   Specimen: Nasopharyngeal Swab  Result Value Ref Range Status   SARS Coronavirus 2 NEGATIVE NEGATIVE Final    Comment: (NOTE) SARS-CoV-2 target nucleic acids are NOT DETECTED. The SARS-CoV-2 RNA is generally detectable in upper and lower respiratory specimens during the acute phase of infection. The  lowest concentration of SARS-CoV-2 viral copies this assay can detect is 250 copies / mL. A negative result does not preclude SARS-CoV-2 infection and should not be used as the sole basis for treatment or other patient management decisions.  A negative result may occur with improper specimen collection / handling, submission of specimen other than nasopharyngeal swab, presence of viral mutation(s) within the areas targeted by this assay, and inadequate number of viral copies (<250 copies / mL). A negative result must be combined with clinical observations, patient history, and epidemiological information. Fact Sheet for Patients:   StrictlyIdeas.no Fact Sheet for Healthcare Providers: BankingDealers.co.za This test is not yet approved or cleared  by the [place] FDA and has been authorized for detection and/or diagnosis of SARS-CoV-2 by FDA under an Emergency Use Authorization (EUA).  This EUA will remain in effect (meaning this test can be used) for the duration of the COVID-19 declaration under Section 564(b)(1) of the Act, 21 U.S.C. section 360bbb-3(b)(1), unless the authorization is terminated or revoked sooner. Performed at [facility], [address] [place], [place] [address]   MRSA PCR Screening     Status: None   Collection Time: 09/22/19  2:47 AM   Specimen: Nasopharyngeal  Result Value Ref Range Status   MRSA by PCR NEGATIVE NEGATIVE Final    Comment:        The GeneXpert MRSA Assay (FDA approved for NASAL specimens only), is one component of a comprehensive MRSA colonization surveillance program. It is not intended to diagnose MRSA infection nor to guide or monitor treatment for MRSA infections. Performed at [facility], [place] [address], [place], [place] [address]   Body fluid culture     Status: None   Collection Time: 09/22/19  4:20 AM   Specimen: Body Fluid  Result Value Ref Range Status    Specimen Description FLUID EMPYEMA  Final   Special Requests NONE  Final   Gram Stain   Final    RARE WBC PRESENT, PREDOMINANTLY PMN ABUNDANT GRAM NEGATIVE RODS FEW GRAM POSITIVE COCCI RARE GRAM POSITIVE RODS Performed at [facility], [place] [address], [place], [place] [address]    Culture ABUNDANT STREPTOCOCCUS CONSTELLATUS  Final   Report Status 09/24/2019 FINAL  Final   Organism ID, Bacteria STREPTOCOCCUS CONSTELLATUS  Final      Susceptibility   Streptococcus constellatus - MIC*    PENICILLIN INTERMEDIATE Intermediate     CEFTRIAXONE 1 SENSITIVE Sensitive     ERYTHROMYCIN <=0.12 SENSITIVE Sensitive     LEVOFLOXACIN 0.5 SENSITIVE Sensitive     VANCOMYCIN 0.5 SENSITIVE Sensitive     * ABUNDANT STREPTOCOCCUS CONSTELLATUS         Radiology Studies: DG Chest Port 1 View  Result Date: 09/25/2019 CLINICAL DATA:  Chest tube placement EXAM: PORTABLE CHEST 1 VIEW COMPARISON:  Sep 23, 2019 FINDINGS: Normal cardiac silhouette. LEFT chest tube in place without pneumothorax. LEFT basilar atelectasis  noted. Small LEFT effusion. RIGHT lung clear. IMPRESSION: 1. LEFT chest tube in place.  No pneumothorax. 2.  LEFT basilar atelectasis and effusion. Electronically Signed   By: Suzy Bouchard M.D.   On: 09/25/2019 08:54        Scheduled Meds: . chlorhexidine gluconate (MEDLINE KIT)  15 mL Mouth Rinse BID  . Chlorhexidine Gluconate Cloth  6 each Topical Daily  . docusate  100 mg Oral BID  . enoxaparin (LOVENOX) injection  40 mg Subcutaneous Q24H  . feeding supplement (ENSURE ENLIVE)  237 mL Oral TID BM  . folic acid  1 mg Oral Daily  . insulin aspart  0-9 Units Subcutaneous TID WC  . mouth rinse  15 mL Mouth Rinse BID  . multivitamin with minerals  1 tablet Oral Daily  . nicotine  21 mg Transdermal Daily  . pantoprazole  40 mg Oral Daily  . polyethylene glycol  17 g Oral Daily  . thiamine  100 mg Oral Daily   Or  . thiamine  100 mg Intravenous Daily   Continuous  Infusions: . cefTRIAXone (ROCEPHIN)  IV 2 g (09/25/19 0958)     LOS: 5 days    Time spent: 40 minutes    Irine Seal, MD [facility]   To contact the attending provider between 7A-7P or the covering provider during after hours 7P-7A, please log into the web site [email or web address] and access using universal [place] password for that web site. If you do not have the password, please call the hospital operator.  09/26/2019, 10:03 AM

## 2019-09-27 DIAGNOSIS — Z720 Tobacco use: Secondary | ICD-10-CM

## 2019-09-27 LAB — CBC
HCT: 30.3 % — ABNORMAL LOW (ref 39.0–52.0)
Hemoglobin: 9.8 g/dL — ABNORMAL LOW (ref 13.0–17.0)
MCH: 31.4 pg (ref 26.0–34.0)
MCHC: 32.3 g/dL (ref 30.0–36.0)
MCV: 97.1 fL (ref 80.0–100.0)
Platelets: 499 10*3/uL — ABNORMAL HIGH (ref 150–400)
RBC: 3.12 MIL/uL — ABNORMAL LOW (ref 4.22–5.81)
RDW: 13.9 % (ref 11.5–15.5)
WBC: 15.3 10*3/uL — ABNORMAL HIGH (ref 4.0–10.5)
nRBC: 0 % (ref 0.0–0.2)

## 2019-09-27 LAB — BASIC METABOLIC PANEL
Anion gap: 7 (ref 5–15)
BUN: 12 mg/dL (ref 6–20)
CO2: 26 mmol/L (ref 22–32)
Calcium: 7.9 mg/dL — ABNORMAL LOW (ref 8.9–10.3)
Chloride: 104 mmol/L (ref 98–111)
Creatinine, Ser: 0.55 mg/dL — ABNORMAL LOW (ref 0.61–1.24)
GFR calc Af Amer: >60 mL/min (ref >60)
GFR calc non Af Amer: >60 mL/min (ref >60)
Glucose, Bld: 143 mg/dL — ABNORMAL HIGH (ref 70–99)
Potassium: 4.2 mmol/L (ref 3.5–5.1)
Sodium: 137 mmol/L (ref 135–145)

## 2019-09-27 LAB — GLUCOSE, CAPILLARY: Glucose-Capillary: 107 mg/dL — ABNORMAL HIGH (ref 70–99)

## 2019-09-27 MED ORDER — ADULT MULTIVITAMIN W/MINERALS CH: 1.0000 | ORAL_TABLET | Freq: Every day | ORAL | Status: AC

## 2019-09-27 MED ORDER — NICOTINE 21 MG/24HR TD PT24: 21.0000 mg | MEDICATED_PATCH | Freq: Every day | TRANSDERMAL | 0 refills | Status: DC

## 2019-09-27 MED ORDER — POLYETHYLENE GLYCOL 3350 17 G PO PACK: 17.0000 g | PACK | Freq: Every day | ORAL | 0 refills | Status: DC | PRN

## 2019-09-27 MED ORDER — FOLIC ACID 1 MG PO TABS: 1.0000 mg | ORAL_TABLET | Freq: Every day | ORAL | Status: AC

## 2019-09-27 MED ORDER — TRAMADOL HCL 50 MG PO TABS
50.0000 mg | ORAL_TABLET | Freq: Once | ORAL | Status: AC
  Administered 2019-09-27: 50 mg via ORAL
  Filled 2019-09-27: qty 1

## 2019-09-27 MED ORDER — THIAMINE HCL 100 MG PO TABS: 100.0000 mg | ORAL_TABLET | Freq: Every day | ORAL | Status: DC

## 2019-09-27 MED ORDER — IBUPROFEN 400 MG PO TABS: 400.0000 mg | ORAL_TABLET | Freq: Three times a day (TID) | ORAL | Status: AC | PRN

## 2019-09-27 MED ORDER — ALBUTEROL SULFATE HFA 108 (90 BASE) MCG/ACT IN AERS: 1.0000 | INHALATION_SPRAY | Freq: Three times a day (TID) | RESPIRATORY_TRACT | 0 refills | Status: DC | PRN

## 2019-09-27 MED ORDER — CEFDINIR 300 MG PO CAPS: 300.0000 mg | ORAL_CAPSULE | Freq: Two times a day (BID) | ORAL | 0 refills | Status: DC

## 2019-09-27 MED ORDER — PANTOPRAZOLE SODIUM 40 MG PO TBEC: 40.0000 mg | DELAYED_RELEASE_TABLET | Freq: Every day | ORAL | 1 refills | Status: AC

## 2019-09-27 MED ORDER — SENNA 8.6 MG PO TABS
1.0000 | ORAL_TABLET | Freq: Two times a day (BID) | ORAL | 0 refills | Status: DC
Start: 2019-09-27 — End: 2019-09-27

## 2019-09-27 NOTE — Progress Notes (Signed)
OT Cancellation Note  Patient Details Name: Victor Torres MRN: [id number] DOB: May 18, 1970   Cancelled Treatment:    Reason Eval/Treat Not Completed: OT screened, no needs identified, will sign off.  Per PT, pt eager to discharge,  performed ADLs with min guard, and has good support at discharge.  Victor Torres., OTR/L Acute Rehabilitation Services Pager [phone] Office [phone]   Jeani Hawking M 09/27/2019, 10:39 AM

## 2019-09-27 NOTE — Progress Notes (Signed)
Discharged home today  Patient's mother will drive him home. Discharged instructions,personal belongings given to patient. Verbalized understanding of instructions

## 2019-09-27 NOTE — Evaluation (Signed)
Physical Therapy Evaluation and Discharge Patient Details Name: Victor Torres MRN: [id number] DOB: July 07, 1970 Today's Date: 09/27/2019   History of Present Illness  49 y.o. M with PMH of ETOH and tobacco abuse who developed cough and dysnpea approximately one week ago, admitted with L-sided empyema and required transfer to ICU, intubation and chest tube.    Clinical Impression  Pt functioning at indep level. Pt SpO2 dropped to 88% on RA s/p 250' of ambulation but quickly recovered to 92% s/p 1 min standing rest break. Discussed energy conservation techniques, taking things slow, and listening to his body as he did when he become SOB and asked to rest. Pt with no further acute PT needs at this time. ACUTE PT SIGNING OFF. Please re-consult if needed in future.    Follow Up Recommendations No PT follow up    Equipment Recommendations    none   Recommendations for Other Services       Precautions / Restrictions Precautions Precautions: Other (comment) Precaution Comments: recent chest tube removal Restrictions Weight Bearing Restrictions: No      Mobility  Bed Mobility Overal bed mobility: Independent             General bed mobility comments: HOB flat, no use of bed rail  Transfers Overall transfer level: Independent Equipment used: None Transfers: Sit to/from Stand Sit to Stand: Independent         General transfer comment: no difficulty, steady  Ambulation/Gait Ambulation/Gait assistance: Min guard(for first time up) Gait Distance (Feet): 500 Feet Assistive device: None Gait Pattern/deviations: Step-through pattern Gait velocity: decreased compared to normal Gait velocity interpretation: 1.31 - 2.62 ft/sec, indicative of limited community ambulator General Gait Details: Pt with drop in SPO2 to 88% on room air, took a 1 min standing rest break and recovered to 92%, SOB noted  Stairs            Wheelchair Mobility    Modified Rankin (Stroke Patients  Only)       Balance Overall balance assessment: Mild deficits observed, not formally tested(pt stood to urinate and wash hands with supervision only)                                           Pertinent Vitals/Pain Pain Assessment: 0-10 Pain Score: 3  Pain Location: L flank, at chest tube removal site Pain Descriptors / Indicators: Sore Pain Intervention(s): Monitored during session    Home Living Family/patient expects to be discharged to:: Private residence Living Arrangements: Spouse/significant other Available Help at Discharge: Family;Available 24 hours/day Type of Home: House Home Access: Stairs to enter Entrance Stairs-Rails: None Entrance Stairs-Number of Steps: 1 Home Layout: One level Home Equipment: None      Prior Function Level of Independence: Independent         Comments: works as a Nurse, adult   Dominant Hand: Right    Extremity/Trunk Assessment   Upper Extremity Assessment Upper Extremity Assessment: Overall WFL for tasks assessed    Lower Extremity Assessment Lower Extremity Assessment: Overall WFL for tasks assessed    Cervical / Trunk Assessment Cervical / Trunk Assessment: Other exceptions Cervical / Trunk Exceptions: L chest tube removed, wound covered by dressing  Communication   Communication: No difficulties  Cognition Arousal/Alertness: Awake/alert Behavior During Therapy: WFL for tasks assessed/performed Overall Cognitive Status: Within Functional Limits for tasks assessed  General Comments: mildly anxious, bouncing R foot up and down because he wants to go home, but cooperative and aware that "it'll take a while to get back on my feet"      General Comments General comments (skin integrity, edema, etc.): L chest tube site with dressing on it, no drainage. SpO2 >92% on RA with exception of dropping to 88% s/p 250' ambulation    Exercises      Assessment/Plan    PT Assessment Patent does not need any further PT services  PT Problem List         PT Treatment Interventions      PT Goals (Current goals can be found in the Care Plan section)  Acute Rehab PT Goals Patient Stated Goal: home today PT Goal Formulation: All assessment and education complete, DC therapy    Frequency     Barriers to discharge        Co-evaluation               AM-PAC PT "6 Clicks" Mobility  Outcome Measure Help needed turning from your back to your side while in a flat bed without using bedrails?: None Help needed moving from lying on your back to sitting on the side of a flat bed without using bedrails?: None Help needed moving to and from a bed to a chair (including a wheelchair)?: None Help needed standing up from a chair using your arms (e.g., wheelchair or bedside chair)?: None Help needed to walk in hospital room?: None Help needed climbing 3-5 steps with a railing? : None 6 Click Score: 24    End of Session Equipment Utilized During Treatment: Gait belt Activity Tolerance: Patient tolerated treatment well Patient left: in chair Nurse Communication: Mobility status PT Visit Diagnosis: Muscle weakness (generalized) (M62.81)    Time: 3568-6168 PT Time Calculation (min) (ACUTE ONLY): 25 min   Charges:   PT Evaluation $PT Eval Low Complexity: 1 Low          Victor Torres, PT, DPT Acute Rehabilitation Services Pager #: [phone] Office #: [phone]   Victor Torres 09/27/2019, 9:12 AM

## 2019-09-27 NOTE — Discharge Summary (Signed)
Physician Discharge Summary  Victor Torres EGB:[id number] DOB: [birth year] DOA: 09/21/2019  PCP: Patient, No Pcp Per  Admit date: 09/21/2019 Discharge date: 09/27/2019  Time spent: 55 minutes  Recommendations for Outpatient Follow-up:  1. Follow-up with Dr.Olalere, pulmonary in 1 week for follow-up on empyema.  Patient will need repeat chest x-ray done in 1 week for follow-up. 2. Follow-up with PCP in 2 weeks.  On follow-up patient need a basic metabolic profile done to follow-up on electrolytes and renal function.   Discharge Diagnoses:  Principal Problem:   Sepsis ([place]) Active Problems:   Acute respiratory failure with hypoxia (HCC)   Empyema (HCC)   Chest pain   Hyponatremia   Malnutrition of moderate degree   Alcohol abuse   Tobacco abuse   Hypokalemia   Discharge Condition: Stable and improved  Diet recommendation: Regular  Filed Weights   09/23/19 0500 09/23/19 2241 09/24/19 0500  Weight: 74.5 kg 81.9 kg 81.6 kg    History of present illness:  HPI per Dr Lynelle Smoke Minshall is a 49 y.o. male with a past medical history of alcohol abuse presented to [facility] ED with complaints of shortness of breath.  He was seen by his PCP and had a negative Covid test.  He had a CT scan of his chest on the 17th which showed multiloculated gas and fluid collection laterally in the left hemithorax concerning for empyema.  He was scheduled for follow-up appointment tomorrow but became too short of breath and came into the ED. Patient reported 3-week history of progressively worsening shortness of breath.  For the past 1 week he has had fevers, chills, and is coughing up foul-smelling brown/pus colored sputum.  He was feeling very tired.  Endorsing decreased p.o. intake.  Also endorsing constant left lower chest pain for several days.  Denied nausea, vomiting, or diarrhea.  He was previously constipated but has had bowel movements for the past 2 days.  Stated he used to drink 1/5  of liquor daily but quit cold [place] a year ago.  He did have 3 shots before coming into the ED on day of admission.  He had been smoking 1 pack of cigarettes daily for the past 30 years.  ED Course: Oxygen saturation 86 to 88% on room air in triage, improved with 5 L supplemental oxygen.  Patient noted to be coughing up foul-smelling sputum.  Febrile with T-max 103.2 F.  Tachycardic and tachypneic.  Not hypotensive.  Labs showing significant leukocytosis with WBC count 21.4.  Lactic acid 2.0.  Hemoglobin 11.1, previously normal.  MCV 92.  Corrected sodium 127.  Potassium 3.4.  BUN 11, creatinine 0.7.  AST 43, ALT 53, alk phos 134, and T bili 1.5.  INR 1.4.  UA not suggestive of infection.  Blood cultures pending.  EKG with LBBB, no prior tracing for comparison.  High-sensitivity troponin checked twice negative.  SARS-CoV-2 PCR test negative.  CT angiogram chest showing a 14 x 7.8 cm thick-walled (approximately 1.3 cm) cavitary lesion within the anterolateral aspect of the mid and lower left lung, increased in size compared to prior exam.  Findings felt to be consistent with an evolving empyema.  Also showing marked severely posterior right basilar atelectasis and/or infiltrate.  Mild, slightly nodular appearing right middle lobe infiltrate.  This is a new finding compared to the prior exam.  Patient received vancomycin, cefepime, metronidazole, 1 L normal saline bolus, IV Decadron 10 mg, albuterol inhaler treatment, and Tylenol.  Hospital Course:  1 sepsis secondary to left empyema/acute hypoxic respiratory failure secondary to left empyema/left pneumonia, POA CT chest which was done on admission was concerning for thick-walled cavitary lesion in the mid lower left lung/empyema.  Patient's condition worsened was transferred to the ICU intubated and chest tube placed.  Cultures from empyema positive for Streptococcus constellatus.    Chest tube was placed by critical care and patient was followed and  monitored during the hospitalization.  Leukocytosis trended down.  Patient remained afebrile.  CT surgery consulted patient was seen by Dr. Caffie Pinto and feels no surgical intervention is indicated at this time and to continue with pigtail catheter in addition to IV antibiotics.  Repeat chest CT was done 09/26/2019 and reviewed by PCCM which showed complete evacuation of empyema.  Chest tube was subsequently removed on 09/27/2019 without any complications.  Patient was maintained on IV antibiotics of Rocephin during the hospitalization and subsequently transition to oral Omnicef.  Patient be discharged on Omnicef x21 days per pulmonary to complete a course of treatment.  Outpatient follow-up with pulmonary in 1 week will need repeat chest x-ray at that time.   2.  Alcohol abuse Patient required Precedex drip which was subsequently been discontinued.  Patient maintained on thiamine, folic acid and multivitamin.  Outpatient follow-up.   3.  Metabolic encephalopathy Likely secondary to problem #1.  Improved with treatment of #1.  Mental status was back to baseline by day of discharge.  4.  Hypovolemic hyponatremia Likely secondary to poor oral intake.  Resolved with hydration.   5.  Mild hypokalemia Repleted.  6.  Tobacco abuse Patient was placed on a nicotine patch.  7.  Malnutrition of moderate degree Patient subsequently placed on a regular diet as well as nutritional supplementation.  Outpatient follow-up.      Procedures: CT angio chest 09/21/2019---A 14.0 cm x 7.8 cm thick walled (approximately 1.3 cm) cavitary lesion within the anterolateral aspect of the mid and lower left lung, increased in size when compared to the prior exam. This is likely consistent with an evolving empyema.  Chest x-ray 09/21/2019, 09/22/2019, 09/23/2019  Abdominal ultrasound 09/22/2019  2D echo 09/22/2019  ETT 09/22/2019  Left chest tube placement 09/22/2019--per Dr. Skipper Cliche PCCM  Left chest tube removal  09/27/2019 per Dr.Olalere  Chest CT 09/26/2019  CT angiogram chest 09/21/2019   Consultations:  PCCM: Dr.Aljishi 09/22/2019  Cardiothoracic surgery: Dr. Cyndia Bent 09/22/2019  Discharge Exam: Vitals:   09/26/19 2023 09/27/19 0528  BP: (!) 151/53 (!) 154/52  Pulse: 74 67  Resp: 18 18  Temp: 98.2 F (36.8 C) 98.5 F (36.9 C)  SpO2: 97% 95%    General: NAD Cardiovascular: RRR Respiratory: CTAB  Discharge Instructions   Discharge Instructions    Diet general   Complete by: As directed    Increase activity slowly   Complete by: As directed      Allergies as of 09/27/2019   No Known Allergies     Medication List    STOP taking these medications   meclizine 25 MG tablet Commonly known as: ANTIVERT   Promethazine-Codeine 6.25-10 MG/5ML Soln     TAKE these medications   albuterol 108 (90 Base) MCG/ACT inhaler Commonly known as: VENTOLIN HFA Inhale 1 puff into the lungs every 8 (eight) hours as needed for wheezing or shortness of breath.   cefdinir 300 MG capsule Commonly known as: OMNICEF Take 1 capsule (300 mg total) by mouth every 12 (twelve) hours for 21 days.   folic acid 1 MG  tablet Commonly known as: FOLVITE Take 1 tablet (1 mg total) by mouth daily. Start taking on: Sep 28, 2019   ibuprofen 400 MG tablet Commonly known as: ADVIL Take 1 tablet (400 mg total) by mouth every 8 (eight) hours as needed for moderate pain.   multivitamin with minerals Tabs tablet Take 1 tablet by mouth daily. Start taking on: Sep 28, 2019   nicotine 21 mg/24hr patch Commonly known as: NICODERM CQ - dosed in mg/24 hours Place 1 patch (21 mg total) onto the skin daily. Start taking on: Sep 28, 2019   pantoprazole 40 MG tablet Commonly known as: PROTONIX Take 1 tablet (40 mg total) by mouth daily. Start taking on: Sep 28, 2019   polyethylene glycol 17 g packet Commonly known as: MIRALAX / GLYCOLAX Take 17 g by mouth daily as needed for mild constipation.   thiamine  100 MG tablet Take 1 tablet (100 mg total) by mouth daily. Start taking on: Sep 28, 2019      No Known Allergies Follow-up Information    Olalere, Adewale A, MD. Schedule an appointment as soon as possible for a visit in 1 week(s).   Specialty: Pulmonary Disease Contact information: [place] [address] [phone]        PCP. Schedule an appointment as soon as possible for a visit in 2 week(s).            The results of significant diagnostics from this hospitalization (including imaging, microbiology, ancillary and laboratory) are listed below for reference.    Significant Diagnostic Studies: DG Chest 1 View  Result Date: 09/23/2019 CLINICAL DATA:  Hypoxia EXAM: CHEST  1 VIEW COMPARISON:  Sep 22, 2019 chest radiograph and chest CT Sep 21, 2019 FINDINGS: Endotracheal tube tip is 5.9 cm above the carina. Nasogastric tube tip and side port are below the diaphragm. Chest tube is present on the left, stable from 1 day prior. No evident pneumothorax. Ill-defined airspace opacity remains in the left lower lung region with consolidation medially. Small left pleural effusion evident. Right lung is clear. Heart is mildly enlarged with pulmonary vascularity normal. No adenopathy. No bone lesions. IMPRESSION: Tube and catheter positions as described without pneumothorax. There has been apparent partial drainage of empyema on the left with ill-defined opacity remaining in the left lower lung region with small pleural effusion. Consolidation medial left base present, likely due to combination of atelectasis and suspected pneumonia. Right lung clear.  Stable cardiac prominence. Electronically Signed   By: Lowella Grip III M.D.   On: 09/23/2019 08:38   CT CHEST W CONTRAST  Result Date: 09/26/2019 CLINICAL DATA:  49 year old male with shortness of breath and empyema. EXAM: CT CHEST WITH CONTRAST TECHNIQUE: Multidetector CT imaging of the chest was performed during  intravenous contrast administration. CONTRAST:  6m OMNIPAQUE IOHEXOL 300 MG/ML  SOLN COMPARISON:  Chest CT dated 09/21/2019. FINDINGS: Cardiovascular: There is no cardiomegaly or pericardial effusion. Coronary vascular calcification with severe atherosclerotic calcification of the LAD, advanced for the patient's age. Mild atherosclerotic calcification of the thoracic aorta. The origins of the great vessels of the aortic arch appear patent. The central pulmonary arteries appear unremarkable. Mediastinum/Nodes: There is no hilar or mediastinal adenopathy. The esophagus is grossly unremarkable. No mediastinal fluid collection. Lungs/Pleura: Left pleural pigtail drainage catheter with near complete resolution of the previously seen collection. There is a small amount of air within the left pleural space, likely introduced by the catheter. Small left and probable  trace right pleural effusions. Diffuse bilateral ground-glass nodular opacities most consistent with multifocal pneumonia, clinical correlation is recommended. Patchy area of consolidation in the left lower lobe may represent atelectasis or pneumonia. There is background of centrilobular emphysema. The central airways are patent. Upper Abdomen: No acute abnormality. Musculoskeletal: Degenerative changes of the spine. No acute osseous pathology. IMPRESSION: 1. Left pleural pigtail drainage catheter with near complete resolution of the previously seen collection. 2. Diffuse bilateral ground-glass nodular opacities most consistent with multifocal pneumonia. Clinical correlation and follow-up to resolution recommended. 3. Small left and probable trace right pleural effusions. 4. Aortic Atherosclerosis (ICD10-I70.0) and Emphysema (ICD10-J43.9). 5. Severe atherosclerotic calcification of the LAD, advanced for patient's age. Electronically Signed   By: Anner Crete M.D.   On: 09/26/2019 20:51   CT Angio Chest PE W and/or Wo Contrast  Result Date:  09/21/2019 CLINICAL DATA:  Shortness of breath. EXAM: CT ANGIOGRAPHY CHEST WITH CONTRAST TECHNIQUE: Multidetector CT imaging of the chest was performed using the standard protocol during bolus administration of intravenous contrast. Multiplanar CT image reconstructions and MIPs were obtained to evaluate the vascular anatomy. CONTRAST:  145m OMNIPAQUE IOHEXOL 350 MG/ML SOLN COMPARISON:  None. FINDINGS: Cardiovascular: The subsegmental pulmonary arteries are limited in evaluation secondary to suboptimal opacification with intravenous contrast. This is most notable within the bilateral lung bases. No intraluminal filling defects are clearly identified. Normal heart size. No pericardial effusion. Mediastinum/Nodes: There is mild precarinal lymphadenopathy. Thyroid gland, trachea, and esophagus demonstrate no significant findings. Lungs/Pleura: A 14.0 cm x 7.8 cm thick walled (approximately 1.3 cm) cavitary lesion is seen within the anterolateral aspect of the mid and lower left lung. This is increased in size when compared to the prior exam. A single large air-fluid level is seen. A marked amount of adjacent posterior atelectasis and/or infiltrate is seen within the right lung base. This is increased in severity when compared to the prior study. Mild, slightly nodular appearing infiltrate is seen within the inferior aspect of the right middle lobe. This represents a new finding when compared to the prior exam. There is no evidence of a pleural effusion or pneumothorax. Upper Abdomen: No acute abnormality. Musculoskeletal: No chest wall abnormality. No acute or significant osseous findings. Review of the MIP images confirms the above findings. IMPRESSION: 1. A 14.0 cm x 7.8 cm thick walled (approximately 1.3 cm) cavitary lesion within the anterolateral aspect of the mid and lower left lung, increased in size when compared to the prior exam. This is likely consistent with an evolving empyema. 2. Marked severity posterior  right basilar atelectasis and/or infiltrate. 3. Mild, slightly nodular appearing right middle lobe infiltrate. This represents a new finding when compared to the prior exam. Electronically Signed   By: TVirgina NorfolkM.D.   On: 09/21/2019 18:31   DG Chest Port 1 View  Result Date: 09/25/2019 CLINICAL DATA:  Chest tube placement EXAM: PORTABLE CHEST 1 VIEW COMPARISON:  Sep 23, 2019 FINDINGS: Normal cardiac silhouette. LEFT chest tube in place without pneumothorax. LEFT basilar atelectasis noted. Small LEFT effusion. RIGHT lung clear. IMPRESSION: 1. LEFT chest tube in place.  No pneumothorax. 2.  LEFT basilar atelectasis and effusion. Electronically Signed   By: SSuzy BouchardM.D.   On: 09/25/2019 08:54   DG CHEST PORT 1 VIEW  Result Date: 09/22/2019 CLINICAL DATA:  Empyema, ET placement, OG tube, left chest tube placement EXAM: PORTABLE CHEST 1 VIEW COMPARISON:  CT 09/21/2019, radiograph 09/21/2019 FINDINGS: *Endotracheal tube is position in the mid to  upper trachea, 7.4 cm from the carina. *Transesophageal tube tip and side port are below the level of imaging, likely beyond the GE junction. *Pigtail pleural drain is seen along the left pleural in the vicinity of the complex empyema seen on comparison cross-sectional imaging. Significant interval decrease in size of the air and fluid containing pleural collection in the left lung with some residual basilar opacity likely reflecting combination of residual pleural disease, consolidation and atelectasis. The right lung is predominantly clear. No right pneumothorax or pleural fluid. Cardiac contours are grossly unchanged accounting for opacity obscuring portion of the left heart border. No acute osseous or soft tissue abnormality. IMPRESSION: Pigtail pleural drain along the left pleural likely within the superior extent of the complex empyema seen on comparison cross-sectional imaging. Significant interval decrease in size of the air and fluid containing  pleural collection in the left lung with some residual basilar opacity likely reflecting combination of residual pleural disease, consolidation and atelectasis. Electronically Signed   By: Lovena Le M.D.   On: 09/22/2019 04:46   DG Chest Portable 1 View  Result Date: 09/21/2019 CLINICAL DATA:  Elevated white blood cell count. EXAM: PORTABLE CHEST 1 VIEW COMPARISON:  CT scan Sep 15, 2019 FINDINGS: A rounded collection of air in the lower left hemithorax is at the site of the suspected empyema seen on the CT scan of the chest from Sep 15, 2019. There appears to be more air in the collection today but there is also fluid. Opacity in the left base is identified. No pneumothorax. The right lung is clear. The cardiomediastinal silhouette is otherwise stable. IMPRESSION: 1. Suspected empyema in the left base with more air but persistent fluid. Suspected opacity adjacent to the empyema could be pneumonia or atelectasis. CT imaging could better evaluate. Electronically Signed   By: Dorise Bullion III M.D   On: 09/21/2019 17:01   ECHOCARDIOGRAM COMPLETE  Result Date: 09/22/2019    ECHOCARDIOGRAM REPORT   Patient Name:   WENZEL BACKLUND Date of Exam: 09/22/2019 Medical Rec #:  [id number]     Height:       73.0 in Accession #:    [id number]    Weight:       180.1 lb Date of Birth:  12/13/1970    BSA:          2.058 m Patient Age:    21 years      BP:           100/53 mmHg Patient Gender: M             HR:           57 bpm. Exam Location:  Inpatient Procedure: 2D Echo Indications:    abnormal ECG  History:        Patient has no prior history of Echocardiogram examinations.                 Risk Factors:Current Smoker. Alcohol abuse.  Sonographer:    Jannett Celestine RDCS (AE) Referring Phys: [id number] Shela Leff  Sonographer Comments: Echo performed with patient supine and on artificial respirator. Image acquisition challenging due to respiratory motion. restricted mobility. positioning not ideal IMPRESSIONS  1. Left  ventricular ejection fraction, by estimation, is 60 to 65%. The left ventricle has normal function. The left ventricle has no regional wall motion abnormalities. Left ventricular diastolic parameters were normal.  2. Right ventricular systolic function is normal. The right ventricular size is normal.  3. Right atrial size was  mildly dilated.  4. The mitral valve is normal in structure. Trivial mitral valve regurgitation.  5. The aortic valve is grossly normal. Aortic valve regurgitation is not visualized. No aortic stenosis is present.  6. Aortic dilatation noted. 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 FINDINGS  Left Ventricle: Left ventricular ejection fraction, by estimation, is 60 to 65%. The left ventricle has normal function. The left ventricle has no regional wall motion abnormalities. The left ventricular internal cavity size was normal in size. There is  no left ventricular hypertrophy. Left ventricular diastolic parameters were normal. Right Ventricle: The right ventricular size is normal. No increase in right ventricular wall thickness. Right ventricular systolic function is normal. Left Atrium: Left atrial size was normal in size. Right Atrium: Right atrial size was mildly dilated. Pericardium: There is no evidence of pericardial effusion. Mitral Valve: The mitral valve is normal in structure. Trivial mitral valve regurgitation. MV peak gradient, 19.7 mmHg. The mean mitral valve gradient is 7.0 mmHg. Tricuspid Valve: The tricuspid valve is normal in structure. Tricuspid valve regurgitation is not demonstrated. No evidence of tricuspid stenosis. Aortic Valve: The aortic valve is grossly normal. Aortic valve regurgitation is not visualized. Aortic regurgitation PHT measures 426 msec. No aortic stenosis is present. Pulmonic Valve: The pulmonic valve was normal in structure. Pulmonic valve regurgitation is not visualized. No evidence of pulmonic stenosis. Aorta: The aortic  root was not well visualized and aortic dilatation noted. 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 IAS/Shunts: The atrial septum is grossly normal.  LEFT VENTRICLE PLAX 2D LVIDd:         5.30 cm  Diastology LVIDs:         3.40 cm  LV e' lateral: 11.00 cm/s LV PW:         1.10 cm  LV e' medial:  6.74 cm/s LV IVS:        1.20 cm LVOT diam:     2.45 cm LV SV:         115 LV SV Index:   56 LVOT Area:     4.71 cm  RIGHT VENTRICLE TAPSE (M-mode): 2.9 cm LEFT ATRIUM             Index       RIGHT ATRIUM           Index LA diam:        3.00 cm 1.46 cm/m  RA Area:     20.80 cm LA Vol (A2C):   41.4 ml 20.12 ml/m RA Volume:   51.70 ml  25.12 ml/m LA Vol (A4C):   26.0 ml 12.63 ml/m LA Biplane Vol: 32.4 ml 15.74 ml/m  AORTIC VALVE LVOT Vmax:   107.00 cm/s LVOT Vmean:  68.600 cm/s LVOT VTI:    0.243 m AI PHT:      426 msec  AORTA Ao Root diam: 4.00 cm MITRAL VALVE MV Peak grad: 19.7 mmHg  SHUNTS MV Mean grad: 7.0 mmHg   Systemic VTI:  0.24 m MV Vmax:      2.22 m/s   Systemic Diam: 2.45 cm MV Vmean:     122.0 cm/s Mertie Moores MD Electronically signed by Mertie Moores MD Signature Date/Time: 09/22/2019/2:41:06 PM    Final    US Abdomen Limited RUQ  Result Date: 09/22/2019 CLINICAL DATA:  Elevated LFTs EXAM: ULTRASOUND ABDOMEN LIMITED RIGHT UPPER QUADRANT COMPARISON:  None available FINDINGS: Gallbladder: No gallstones or wall  thickening visualized. Murphy sign cannot be assessed due to patient condition. Common bile duct: Diameter: 5-6 mm.  Where visualized, no filling defect. Liver: No focal lesion identified. Within normal limits in parenchymal echogenicity. Portal vein is patent on color Doppler imaging with normal direction of blood flow towards the liver. IMPRESSION: Negative right upper quadrant ultrasound Electronically Signed   By: Monte Fantasia M.D.   On: 09/22/2019 09:40    Microbiology: Recent Results (from the past 240 hour(s))  Blood Culture (routine x 2)     Status:  None   Collection Time: 09/21/19  4:19 PM   Specimen: BLOOD  Result Value Ref Range Status   Specimen Description   Final    BLOOD LEFT ANTECUBITAL Performed at [facility], [address] [place], [place] [address]    Special Requests   Final    BOTTLES DRAWN AEROBIC AND ANAEROBIC Blood Culture adequate volume Performed at [facility], [address] [place], [place] [address]    Culture   Final    NO GROWTH 5 DAYS Performed at [facility], [place] [address] [place], [place] [address]    Report Status 09/26/2019 FINAL  Final  Blood Culture (routine x 2)     Status: None   Collection Time: 09/21/19  4:20 PM   Specimen: BLOOD  Result Value Ref Range Status   Specimen Description   Final    BLOOD BLOOD RIGHT FOREARM Performed at [facility], [address] [place], [place] [address]    Special Requests   Final    BOTTLES DRAWN AEROBIC AND ANAEROBIC Blood Culture adequate volume Performed at [facility], [address] [place], [place] [address]    Culture   Final    NO GROWTH 5 DAYS Performed at [facility], [place] [address] [place], [place] [address]    Report Status 09/26/2019 FINAL  Final  Urine culture     Status: None   Collection Time: 09/21/19  4:20 PM   Specimen: In/Out Cath Urine  Result Value Ref Range Status   Specimen Description   Final    IN/OUT CATH URINE Performed at [facility], [address] [place], [place] [address]    Special Requests   Final    NONE Performed at [facility], [address] [place], [place] [address]    Culture   Final    NO GROWTH Performed at [facility], [place] [address], [place],  [address]    Report Status 09/23/2019 FINAL  Final  SARS Coronavirus 2 by RT PCR (hospital order, performed in [facility] hospital lab) Nasopharyngeal Nasopharyngeal Swab     Status: None   Collection Time: 09/21/19  4:20 PM   Specimen:  Nasopharyngeal Swab  Result Value Ref Range Status   SARS Coronavirus 2 NEGATIVE NEGATIVE Final    Comment: (NOTE) SARS-CoV-2 target nucleic acids are NOT DETECTED. The SARS-CoV-2 RNA is generally detectable in upper and lower respiratory specimens during the acute phase of infection. The lowest concentration of SARS-CoV-2 viral copies this assay can detect is 250 copies / mL. A negative result does not preclude SARS-CoV-2 infection and should not be used as the sole basis for treatment or other patient management decisions.  A negative result may occur with improper specimen collection / handling, submission of specimen other than nasopharyngeal swab, presence of viral mutation(s) within  the areas targeted by this assay, and inadequate number of viral copies (<250 copies / mL). A negative result must be combined with clinical observations, patient history, and epidemiological information. Fact Sheet for Patients:   StrictlyIdeas.no Fact Sheet for Healthcare Providers: BankingDealers.co.za This test is not yet approved or cleared  by the [place] FDA and has been authorized for detection and/or diagnosis of SARS-CoV-2 by FDA under an Emergency Use Authorization (EUA).  This EUA will remain in effect (meaning this test can be used) for the duration of the COVID-19 declaration under Section 564(b)(1) of the Act, 21 U.S.C. section 360bbb-3(b)(1), unless the authorization is terminated or revoked sooner. Performed at [facility], [address] [place], [place] [address]   MRSA PCR Screening     Status: None   Collection Time: 09/22/19  2:47 AM   Specimen: Nasopharyngeal  Result Value Ref Range Status   MRSA by PCR NEGATIVE NEGATIVE Final    Comment:        The GeneXpert MRSA Assay (FDA approved for NASAL specimens only), is one component of a comprehensive MRSA colonization surveillance program. It is not intended  to diagnose MRSA infection nor to guide or monitor treatment for MRSA infections. Performed at [facility], [place] [address], [place], [place] [address]   Body fluid culture     Status: None   Collection Time: 09/22/19  4:20 AM   Specimen: Body Fluid  Result Value Ref Range Status   Specimen Description FLUID EMPYEMA  Final   Special Requests NONE  Final   Gram Stain   Final    RARE WBC PRESENT, PREDOMINANTLY PMN ABUNDANT GRAM NEGATIVE RODS FEW GRAM POSITIVE COCCI RARE GRAM POSITIVE RODS Performed at [facility], [place] [address], [place], [place] [address]    Culture ABUNDANT STREPTOCOCCUS CONSTELLATUS  Final   Report Status 09/24/2019 FINAL  Final   Organism ID, Bacteria STREPTOCOCCUS CONSTELLATUS  Final      Susceptibility   Streptococcus constellatus - MIC*    PENICILLIN INTERMEDIATE Intermediate     CEFTRIAXONE 1 SENSITIVE Sensitive     ERYTHROMYCIN <=0.12 SENSITIVE Sensitive     LEVOFLOXACIN 0.5 SENSITIVE Sensitive     VANCOMYCIN 0.5 SENSITIVE Sensitive     * ABUNDANT STREPTOCOCCUS CONSTELLATUS     Labs: Basic Metabolic Panel: Recent Labs  Lab 09/22/19 0607 09/22/19 1037 09/22/19 1316 09/22/19 1635 09/23/19 0242 09/24/19 0241 09/25/19 0200 09/26/19 0222 09/27/19 0434  NA 134*   < >  --   --  137 137 135 139 137  K 4.1   < >  --   --  4.8 3.7 3.8 3.7 4.2  CL 104   < >  --   --  104 101 101 106 104  CO2 23   < >  --   --  '24 25 27 26 26  ' GLUCOSE 211*   < >  --   --  209* 92 101* 147* 143*  BUN 11   < >  --   --  '20 16 13 12 12  ' CREATININE 0.56*   < >  --   --  0.54* 0.64 0.57* 0.61 0.55*  CALCIUM 8.2*   < >  --   --  8.4* 8.0* 7.7* 7.7* 7.9*  MG 2.1  --  2.1 2.4 2.1  --  1.8 2.0  --   PHOS 3.6  --  3.5 3.8 3.5  --   --   --   --    < > =  values in this interval not displayed.   Liver Function Tests: Recent Labs  Lab 09/21/19 1618  AST 43*  ALT 53*  ALKPHOS 134*  BILITOT 1.5*  PROT 7.8  ALBUMIN 2.2*   No results for input(s): LIPASE,  AMYLASE in the last 168 hours. No results for input(s): AMMONIA in the last 168 hours. CBC: Recent Labs  Lab 09/21/19 1618 09/22/19 0537 09/23/19 0242 09/24/19 0241 09/25/19 0200 09/26/19 0222 09/27/19 0434  WBC 21.4*  --  19.9* 16.9* 16.1* 16.1* 15.3*  NEUTROABS 18.4*  --   --   --   --   --   --   HGB 11.1*   < > 8.8* 9.4* 9.4* 9.5* 9.8*  HCT 31.7*   < > 27.0* 28.6* 28.5* 29.1* 30.3*  MCV 92.7  --  97.1 95.0 95.0 96.0 97.1  PLT 406*  --  360 389 462* 469* 499*   < > = values in this interval not displayed.   Cardiac Enzymes: No results for input(s): CKTOTAL, CKMB, CKMBINDEX, TROPONINI in the last 168 hours. BNP: BNP (last 3 results) No results for input(s): BNP in the last 8760 hours.  ProBNP (last 3 results) No results for input(s): PROBNP in the last 8760 hours.  CBG: Recent Labs  Lab 09/26/19 1152 09/26/19 1341 09/26/19 1726 09/26/19 2020 09/27/19 0756  GLUCAP 122* 89 189* 95 107*       Signed:  Irine Seal MD.  [facility] 09/27/2019, 10:57 AM

## 2019-09-27 NOTE — Progress Notes (Addendum)
NAME:  Victor Torres, MRN:  [id number], DOB:  08/18/1970, LOS: 6 ADMISSION DATE:  09/21/2019, CONSULTATION DATE:  09/27/19 REFERRING MD:  Hospitalist, CHIEF COMPLAINT:  empyema   Brief History   49 y.o. M with PMH of ETOH and tobacco abuse who developed cough and dysnpea approximately one week ago, admitted with L-sided empyema and required transfer to ICU, intubation and chest tube.  History of present illness   49 y.o. M with PMH of ETOH and tobacco abuse who presented to [place] ED with shortness of breath.   He initially went to urgent care on 5/17 and had an outpatient CT showing possible empyema, he was scheduled for a follow up appointment, but became increasingly SOB, so presented to the ED with fever, chills and cough productive of purulent sputum.  He was requiring 5L [place] oxygen and CTA significant for 14x7.8cm thick walled cavitary lesion in the Mid-lower L lung. He was treated with Vanc, cefepime, flagyl and Decadron.  IR consulted for chest tube, asked that PCCM evaluate overnight.    He continued to worsen and required transfer to the ICU, intubation and placement of chest tube.  Past Medical History   has a past medical history of Alcohol abuse., per notes he quit one year ago, but did three shots before presenting to the ED.   Significant Hospital Events   5/23 admitted to hospitalists 5/24 PCCM consult, transfer to ICU and intubation 5/29 chest tube removed  Consults:  PCCM  Procedures:  5/24 ETT 5/24 L chest tube 5/29 chest tube removed  Significant Diagnostic Tests:  5/23 CTA chest>>A 14.0 cm x 7.8 cm thick walled (approximately 1.3 cm) cavitary lesion within the anterolateral aspect of the mid and lower left lung, increased in size when compared to the prior exam. This is likely consistent with an evolving empyema.  5/28 CT chest Complete evacuation of thick-walled cavity with minimal effluent from chest tube  Micro Data:  5/23 Sars-Cov-2>>negative 5/23 MRSA  screen-negative 5/23 Urine culture>> 5/23 Blood culture>> 5/23 Body fluid culture (empyema)-ABUNDANT STREPTOCOCCUS CONSTELLATUS   Antimicrobials:  Vancomycin 5/23-5/25 Cefepime 5/23-5/25 Flagyl 5/23-5/25 Ceftriaxone 5/26>>  Interim history/subjective:  Has no complaints Wants to go home  Objective   Blood pressure (!) 154/52, pulse 67, temperature 98.5 F (36.9 C), temperature source Oral, resp. rate 18, height 6\' 1"  (1.854 m), weight 81.6 kg, SpO2 95 %.        Intake/Output Summary (Last 24 hours) at 09/27/2019 0751 Last data filed at 09/27/2019 0600 Gross per 24 hour  Intake 1360 ml  Output 1950 ml  Net -590 ml   Filed Weights   09/23/19 0500 09/23/19 2241 09/24/19 0500  Weight: 74.5 kg 81.9 kg 81.6 kg    GEN: In no acute distress  HEENT: Moist oral mucosa CV: S1-S2 appreciated PULM:  good air entry bilaterally Bowel sounds appreciated  Leukocytosis-continues to improve-15.3 today Chest x-ray reviewed by myself showing improvement in findings-partial drainage of noted empyema-will repeat today  CT reviewed by myself showing complete evacuation of empyema  Resolved Hospital Problem list     Assessment & Plan:   Acute hypoxic respiratory failure secondary to left empyema and acute metabolic encephalopathy -Chest tube removed today -Wean off oxygen supplementation to keep saturations greater than 90%  Empyema -Chest tube discontinued -21 days of Omnicef -Streptococcus constellatus  History of EtOH abuse -Working on quitting -On thiamine multivitamin and folic acid  Metabolic encephalopathy -Resolved   Stable for discharge from a pulmonary perspective  Pulmonary will sign off May take off chest tube dressings in about 3 to 4 days-if not completely healed over me apply Band-Aid to site until healed Chest x-ray may be obtained in about a week to make sure no recollection of fluid  Best practice:  Diet: Tolerating p.o. Pain/Anxiety/Delirium protocol  (if indicated):tylenol, CIWA protocol lorazepam VAP protocol (if indicated): not indicated at present DVT prophylaxis: Lovenox GI prophylaxis: protonix Glucose control: SSI Mobility: Ambulate as tolerated Code Status: full code  Family Communication: Patient fully aware of ongoing events Disposition: med surg  Virl Diamond, MD  PCCM Pager: [phone]

## 2019-10-08 ENCOUNTER — Other Ambulatory Visit: Payer: Self-pay

## 2019-10-08 ENCOUNTER — Emergency Department (HOSPITAL_BASED_OUTPATIENT_CLINIC_OR_DEPARTMENT_OTHER): Payer: 59

## 2019-10-08 ENCOUNTER — Inpatient Hospital Stay (HOSPITAL_BASED_OUTPATIENT_CLINIC_OR_DEPARTMENT_OTHER)
Admission: EM | Admit: 2019-10-08 | Discharge: 2019-10-15 | DRG: 871 | Disposition: A | Discharge status: Home / Self Care | Payer: 59 | Attending: Family Medicine | Admitting: Family Medicine

## 2019-10-08 ENCOUNTER — Encounter (HOSPITAL_BASED_OUTPATIENT_CLINIC_OR_DEPARTMENT_OTHER): Payer: Self-pay

## 2019-10-08 ENCOUNTER — Ambulatory Visit: Payer: 59

## 2019-10-08 ENCOUNTER — Telehealth: Payer: Self-pay | Admitting: Acute Care

## 2019-10-08 DIAGNOSIS — J432 Centrilobular emphysema: Secondary | ICD-10-CM | POA: Diagnosis not present

## 2019-10-08 DIAGNOSIS — D638 Anemia in other chronic diseases classified elsewhere: Secondary | ICD-10-CM | POA: Diagnosis present

## 2019-10-08 DIAGNOSIS — R1031 Right lower quadrant pain: Secondary | ICD-10-CM | POA: Diagnosis present

## 2019-10-08 DIAGNOSIS — R079 Chest pain, unspecified: Secondary | ICD-10-CM

## 2019-10-08 DIAGNOSIS — R0602 Shortness of breath: Secondary | ICD-10-CM

## 2019-10-08 DIAGNOSIS — M79629 Pain in unspecified upper arm: Secondary | ICD-10-CM | POA: Diagnosis present

## 2019-10-08 DIAGNOSIS — F17210 Nicotine dependence, cigarettes, uncomplicated: Secondary | ICD-10-CM | POA: Diagnosis present

## 2019-10-08 DIAGNOSIS — Z23 Encounter for immunization: Secondary | ICD-10-CM

## 2019-10-08 DIAGNOSIS — F1010 Alcohol abuse, uncomplicated: Secondary | ICD-10-CM | POA: Diagnosis present

## 2019-10-08 DIAGNOSIS — L8990 Pressure ulcer of unspecified site, unspecified stage: Secondary | ICD-10-CM | POA: Insufficient documentation

## 2019-10-08 DIAGNOSIS — Y95 Nosocomial condition: Secondary | ICD-10-CM | POA: Diagnosis present

## 2019-10-08 DIAGNOSIS — I77810 Thoracic aortic ectasia: Secondary | ICD-10-CM | POA: Diagnosis present

## 2019-10-08 DIAGNOSIS — R109 Unspecified abdominal pain: Secondary | ICD-10-CM

## 2019-10-08 DIAGNOSIS — R160 Hepatomegaly, not elsewhere classified: Secondary | ICD-10-CM | POA: Diagnosis present

## 2019-10-08 DIAGNOSIS — K5900 Constipation, unspecified: Secondary | ICD-10-CM | POA: Diagnosis present

## 2019-10-08 DIAGNOSIS — E871 Hypo-osmolality and hyponatremia: Secondary | ICD-10-CM

## 2019-10-08 DIAGNOSIS — Z20822 Contact with and (suspected) exposure to covid-19: Secondary | ICD-10-CM | POA: Diagnosis present

## 2019-10-08 DIAGNOSIS — R609 Edema, unspecified: Secondary | ICD-10-CM | POA: Diagnosis present

## 2019-10-08 DIAGNOSIS — J939 Pneumothorax, unspecified: Secondary | ICD-10-CM

## 2019-10-08 DIAGNOSIS — A409 Streptococcal sepsis, unspecified: Secondary | ICD-10-CM | POA: Diagnosis not present

## 2019-10-08 DIAGNOSIS — L89159 Pressure ulcer of sacral region, unspecified stage: Secondary | ICD-10-CM | POA: Diagnosis present

## 2019-10-08 DIAGNOSIS — R590 Localized enlarged lymph nodes: Secondary | ICD-10-CM | POA: Diagnosis present

## 2019-10-08 DIAGNOSIS — J90 Pleural effusion, not elsewhere classified: Secondary | ICD-10-CM | POA: Diagnosis present

## 2019-10-08 DIAGNOSIS — E876 Hypokalemia: Secondary | ICD-10-CM | POA: Diagnosis not present

## 2019-10-08 DIAGNOSIS — R778 Other specified abnormalities of plasma proteins: Secondary | ICD-10-CM

## 2019-10-08 DIAGNOSIS — B954 Other streptococcus as the cause of diseases classified elsewhere: Secondary | ICD-10-CM | POA: Diagnosis present

## 2019-10-08 DIAGNOSIS — J189 Pneumonia, unspecified organism: Secondary | ICD-10-CM

## 2019-10-08 DIAGNOSIS — J9601 Acute respiratory failure with hypoxia: Secondary | ICD-10-CM | POA: Diagnosis present

## 2019-10-08 DIAGNOSIS — Z938 Other artificial opening status: Secondary | ICD-10-CM

## 2019-10-08 DIAGNOSIS — J869 Pyothorax without fistula: Secondary | ICD-10-CM | POA: Diagnosis present

## 2019-10-08 DIAGNOSIS — I248 Other forms of acute ischemic heart disease: Secondary | ICD-10-CM | POA: Diagnosis present

## 2019-10-08 LAB — BASIC METABOLIC PANEL WITH GFR
Anion gap: 14 (ref 5–15)
BUN: 32 mg/dL — ABNORMAL HIGH (ref 6–20)
CO2: 23 mmol/L (ref 22–32)
Calcium: 8.4 mg/dL — ABNORMAL LOW (ref 8.9–10.3)
Chloride: 93 mmol/L — ABNORMAL LOW (ref 98–111)
Creatinine, Ser: 1.12 mg/dL (ref 0.61–1.24)
GFR calc Af Amer: >60 mL/min
GFR calc non Af Amer: >60 mL/min
Glucose, Bld: 177 mg/dL — ABNORMAL HIGH (ref 70–99)
Potassium: 3.1 mmol/L — ABNORMAL LOW (ref 3.5–5.1)
Sodium: 130 mmol/L — ABNORMAL LOW (ref 135–145)

## 2019-10-08 MED ORDER — VANCOMYCIN HCL IN DEXTROSE 1-5 GM/200ML-% IV SOLN: 1000.0000 mg | Freq: Once | INTRAVENOUS | Status: AC

## 2019-10-08 MED ORDER — PIPERACILLIN-TAZOBACTAM 3.375 G IVPB
INTRAVENOUS | Status: AC
  Administered 2019-10-09: 3.375 g via INTRAVENOUS
  Filled 2019-10-08: qty 50

## 2019-10-08 MED ORDER — PIPERACILLIN-TAZOBACTAM 3.375 G IVPB 30 MIN
3.3750 g | Freq: Once | INTRAVENOUS | Status: AC
  Filled 2019-10-08: qty 50

## 2019-10-08 MED ORDER — VANCOMYCIN HCL IN DEXTROSE 1-5 GM/200ML-% IV SOLN
INTRAVENOUS | Status: AC
  Administered 2019-10-09: 1000 mg via INTRAVENOUS
  Filled 2019-10-08: qty 200

## 2019-10-08 NOTE — Telephone Encounter (Signed)
Called and spoke with Patient.  Patient stated he was admitted at [facility] hospital 5/23-5/29, and had fluid removed from his lungs.  Patient stated he has upcoming OV with SG, 10/13/19, but feels he may need to be seen before.  Patient stated he is still sore, and fatigued.  Patient stated he is having SHOB, some cough, and chills at times. Patient stated he was unsure of fever, because he has not been able to check his temp. Patient stated he is afraid he may be building fluid in his lungs again. Patient stated he is doing recommendations from hospital discharge, but feels he is not getting better.  Patient has not been seen by provider in [facility] office.  Message routed to Maralyn Sago, NP to advise

## 2019-10-08 NOTE — ED Triage Notes (Signed)
Pt c/o SOB and "coughing up pus"-pt with recent admn for same-pale/couging upon arrival

## 2019-10-08 NOTE — Addendum Note (Signed)
Addended by: Jacquiline Doe on: 10/08/2019 10:19 AM   Modules accepted: Orders

## 2019-10-08 NOTE — Telephone Encounter (Signed)
Pt. Needs to be seen in the office with a CXR. Does anyone have availability today?

## 2019-10-08 NOTE — ED Notes (Signed)
XR Bedside

## 2019-10-08 NOTE — Telephone Encounter (Signed)
Called and spoke with Patient. Patient aware of need to be seen by a provider today, and for cxr.  At this time, there is no openings for NP's or provider's in office today.  Advised Patient to go to Urgent Care, or ED if he is getting worse.  Patient stated understanding.  Patient stated he was going to [facility] [address]. Patient has hospital follow up scheduled 10/13/19, with Maralyn Sago, NP. Nothing further at this time.

## 2019-10-08 NOTE — ED Notes (Signed)
Placed patient on 2L [place]. Patient tolerating well.

## 2019-10-09 ENCOUNTER — Inpatient Hospital Stay (HOSPITAL_COMMUNITY): Payer: 59

## 2019-10-09 ENCOUNTER — Encounter (HOSPITAL_BASED_OUTPATIENT_CLINIC_OR_DEPARTMENT_OTHER): Payer: Self-pay | Admitting: Emergency Medicine

## 2019-10-09 DIAGNOSIS — J189 Pneumonia, unspecified organism: Secondary | ICD-10-CM

## 2019-10-09 DIAGNOSIS — D638 Anemia in other chronic diseases classified elsewhere: Secondary | ICD-10-CM | POA: Diagnosis present

## 2019-10-09 DIAGNOSIS — Z720 Tobacco use: Secondary | ICD-10-CM

## 2019-10-09 DIAGNOSIS — I248 Other forms of acute ischemic heart disease: Secondary | ICD-10-CM | POA: Diagnosis present

## 2019-10-09 DIAGNOSIS — B954 Other streptococcus as the cause of diseases classified elsewhere: Secondary | ICD-10-CM | POA: Diagnosis present

## 2019-10-09 DIAGNOSIS — F17210 Nicotine dependence, cigarettes, uncomplicated: Secondary | ICD-10-CM | POA: Diagnosis present

## 2019-10-09 DIAGNOSIS — F1010 Alcohol abuse, uncomplicated: Secondary | ICD-10-CM | POA: Diagnosis present

## 2019-10-09 DIAGNOSIS — R590 Localized enlarged lymph nodes: Secondary | ICD-10-CM | POA: Diagnosis present

## 2019-10-09 DIAGNOSIS — Y95 Nosocomial condition: Secondary | ICD-10-CM | POA: Diagnosis present

## 2019-10-09 DIAGNOSIS — R1031 Right lower quadrant pain: Secondary | ICD-10-CM

## 2019-10-09 DIAGNOSIS — A419 Sepsis, unspecified organism: Secondary | ICD-10-CM | POA: Diagnosis not present

## 2019-10-09 DIAGNOSIS — J432 Centrilobular emphysema: Secondary | ICD-10-CM | POA: Diagnosis not present

## 2019-10-09 DIAGNOSIS — R778 Other specified abnormalities of plasma proteins: Secondary | ICD-10-CM | POA: Diagnosis not present

## 2019-10-09 DIAGNOSIS — E871 Hypo-osmolality and hyponatremia: Secondary | ICD-10-CM | POA: Diagnosis present

## 2019-10-09 DIAGNOSIS — E876 Hypokalemia: Secondary | ICD-10-CM | POA: Diagnosis present

## 2019-10-09 DIAGNOSIS — J869 Pyothorax without fistula: Secondary | ICD-10-CM | POA: Diagnosis present

## 2019-10-09 DIAGNOSIS — K5900 Constipation, unspecified: Secondary | ICD-10-CM | POA: Diagnosis present

## 2019-10-09 DIAGNOSIS — Z23 Encounter for immunization: Secondary | ICD-10-CM | POA: Diagnosis not present

## 2019-10-09 DIAGNOSIS — J90 Pleural effusion, not elsewhere classified: Secondary | ICD-10-CM | POA: Diagnosis present

## 2019-10-09 DIAGNOSIS — L89159 Pressure ulcer of sacral region, unspecified stage: Secondary | ICD-10-CM | POA: Diagnosis present

## 2019-10-09 DIAGNOSIS — A409 Streptococcal sepsis, unspecified: Secondary | ICD-10-CM | POA: Diagnosis present

## 2019-10-09 DIAGNOSIS — R160 Hepatomegaly, not elsewhere classified: Secondary | ICD-10-CM | POA: Diagnosis present

## 2019-10-09 DIAGNOSIS — Z20822 Contact with and (suspected) exposure to covid-19: Secondary | ICD-10-CM | POA: Diagnosis present

## 2019-10-09 DIAGNOSIS — J9601 Acute respiratory failure with hypoxia: Secondary | ICD-10-CM | POA: Diagnosis present

## 2019-10-09 DIAGNOSIS — R609 Edema, unspecified: Secondary | ICD-10-CM | POA: Diagnosis present

## 2019-10-09 DIAGNOSIS — M79629 Pain in unspecified upper arm: Secondary | ICD-10-CM | POA: Diagnosis present

## 2019-10-09 DIAGNOSIS — I77810 Thoracic aortic ectasia: Secondary | ICD-10-CM | POA: Diagnosis present

## 2019-10-09 LAB — CBC WITH DIFFERENTIAL/PLATELET
Abs Immature Granulocytes: 0.4 10*3/uL — ABNORMAL HIGH (ref 0.00–0.07)
Basophils Absolute: 0.1 10*3/uL (ref 0.0–0.1)
Basophils Relative: 0 %
Eosinophils Absolute: 0 10*3/uL (ref 0.0–0.5)
Eosinophils Relative: 0 %
HCT: 32.1 % — ABNORMAL LOW (ref 39.0–52.0)
Hemoglobin: 10.7 g/dL — ABNORMAL LOW (ref 13.0–17.0)
Immature Granulocytes: 2 %
Lymphocytes Relative: 5 %
Lymphs Abs: 1.3 10*3/uL (ref 0.7–4.0)
MCH: 31.4 pg (ref 26.0–34.0)
MCHC: 33.3 g/dL (ref 30.0–36.0)
MCV: 94.1 fL (ref 80.0–100.0)
Monocytes Absolute: 1.8 10*3/uL — ABNORMAL HIGH (ref 0.1–1.0)
Monocytes Relative: 7 %
Neutro Abs: 21.6 10*3/uL — ABNORMAL HIGH (ref 1.7–7.7)
Neutrophils Relative %: 86 %
Platelets: 372 10*3/uL (ref 150–400)
RBC: 3.41 MIL/uL — ABNORMAL LOW (ref 4.22–5.81)
RDW: 15.6 % — ABNORMAL HIGH (ref 11.5–15.5)
Smear Review: NORMAL
WBC: 25.2 10*3/uL — ABNORMAL HIGH (ref 4.0–10.5)
nRBC: 0 % (ref 0.0–0.2)

## 2019-10-09 LAB — BASIC METABOLIC PANEL
Anion gap: 8 (ref 5–15)
BUN: 17 mg/dL (ref 6–20)
CO2: 26 mmol/L (ref 22–32)
Calcium: 7.8 mg/dL — ABNORMAL LOW (ref 8.9–10.3)
Chloride: 98 mmol/L (ref 98–111)
Creatinine, Ser: 0.68 mg/dL (ref 0.61–1.24)
GFR calc Af Amer: >60 mL/min (ref >60)
GFR calc non Af Amer: >60 mL/min (ref >60)
Glucose, Bld: 111 mg/dL — ABNORMAL HIGH (ref 70–99)
Potassium: 4.1 mmol/L (ref 3.5–5.1)
Sodium: 132 mmol/L — ABNORMAL LOW (ref 135–145)

## 2019-10-09 LAB — CBC
HCT: 27.1 % — ABNORMAL LOW (ref 39.0–52.0)
Hemoglobin: 8.9 g/dL — ABNORMAL LOW (ref 13.0–17.0)
MCH: 31.2 pg (ref 26.0–34.0)
MCHC: 32.8 g/dL (ref 30.0–36.0)
MCV: 95.1 fL (ref 80.0–100.0)
Platelets: 330 10*3/uL (ref 150–400)
RBC: 2.85 MIL/uL — ABNORMAL LOW (ref 4.22–5.81)
RDW: 15.6 % — ABNORMAL HIGH (ref 11.5–15.5)
WBC: 20.2 10*3/uL — ABNORMAL HIGH (ref 4.0–10.5)
nRBC: 0 % (ref 0.0–0.2)

## 2019-10-09 LAB — TROPONIN I (HIGH SENSITIVITY)
Troponin I (High Sensitivity): 23 ng/L — ABNORMAL HIGH (ref <18)
Troponin I (High Sensitivity): 106 ng/L (ref <18)
Troponin I (High Sensitivity): 62 ng/L — ABNORMAL HIGH (ref <18)

## 2019-10-09 LAB — MRSA PCR SCREENING: MRSA by PCR: NEGATIVE

## 2019-10-09 LAB — SARS CORONAVIRUS 2 BY RT PCR (HOSPITAL ORDER, PERFORMED IN ~~LOC~~ HOSPITAL LAB): SARS Coronavirus 2: NEGATIVE

## 2019-10-09 MED ORDER — SODIUM CHLORIDE 0.9 % IV SOLN: INTRAVENOUS | Status: DC

## 2019-10-09 MED ORDER — PANTOPRAZOLE SODIUM 40 MG PO TBEC
40.0000 mg | DELAYED_RELEASE_TABLET | Freq: Every day | ORAL | Status: DC
  Administered 2019-10-10 – 2019-10-15 (×6): 40 mg via ORAL
  Filled 2019-10-09 (×6): qty 1

## 2019-10-09 MED ORDER — VANCOMYCIN HCL IN DEXTROSE 1-5 GM/200ML-% IV SOLN
1000.0000 mg | Freq: Three times a day (TID) | INTRAVENOUS | Status: DC
  Administered 2019-10-10 – 2019-10-13 (×11): 1000 mg via INTRAVENOUS
  Filled 2019-10-09 (×12): qty 200

## 2019-10-09 MED ORDER — NICOTINE 21 MG/24HR TD PT24
21.0000 mg | MEDICATED_PATCH | Freq: Every day | TRANSDERMAL | Status: DC
  Administered 2019-10-09 – 2019-10-15 (×7): 21 mg via TRANSDERMAL
  Filled 2019-10-09 (×8): qty 1

## 2019-10-09 MED ORDER — ENOXAPARIN SODIUM 40 MG/0.4ML ~~LOC~~ SOLN
40.0000 mg | SUBCUTANEOUS | Status: DC
  Administered 2019-10-09 – 2019-10-14 (×6): 40 mg via SUBCUTANEOUS
  Filled 2019-10-09 (×6): qty 0.4

## 2019-10-09 MED ORDER — THIAMINE HCL 100 MG PO TABS
100.0000 mg | ORAL_TABLET | Freq: Every day | ORAL | Status: DC
  Administered 2019-10-10 – 2019-10-15 (×6): 100 mg via ORAL
  Filled 2019-10-09 (×6): qty 1

## 2019-10-09 MED ORDER — ALBUTEROL SULFATE (2.5 MG/3ML) 0.083% IN NEBU: 2.5000 mg | INHALATION_SOLUTION | RESPIRATORY_TRACT | Status: DC | PRN

## 2019-10-09 MED ORDER — VANCOMYCIN HCL IN DEXTROSE 1-5 GM/200ML-% IV SOLN
1000.0000 mg | INTRAVENOUS | Status: AC
  Administered 2019-10-09: 1000 mg via INTRAVENOUS
  Filled 2019-10-09: qty 200

## 2019-10-09 MED ORDER — ALUM & MAG HYDROXIDE-SIMETH 200-200-20 MG/5ML PO SUSP
15.0000 mL | Freq: Once | ORAL | Status: AC
  Administered 2019-10-09: 15 mL via ORAL
  Filled 2019-10-09: qty 30

## 2019-10-09 MED ORDER — METRONIDAZOLE IN NACL 5-0.79 MG/ML-% IV SOLN
500.0000 mg | Freq: Three times a day (TID) | INTRAVENOUS | Status: DC
  Administered 2019-10-09 – 2019-10-13 (×11): 500 mg via INTRAVENOUS
  Filled 2019-10-09 (×11): qty 100

## 2019-10-09 MED ORDER — ACETAMINOPHEN 325 MG PO TABS
650.0000 mg | ORAL_TABLET | Freq: Four times a day (QID) | ORAL | Status: DC | PRN
  Administered 2019-10-09 – 2019-10-12 (×2): 650 mg via ORAL
  Filled 2019-10-09 (×2): qty 2

## 2019-10-09 MED ORDER — SODIUM CHLORIDE 0.9 % IV SOLN
2.0000 g | Freq: Three times a day (TID) | INTRAVENOUS | Status: DC
  Administered 2019-10-09 – 2019-10-15 (×18): 2 g via INTRAVENOUS
  Filled 2019-10-09 (×19): qty 2

## 2019-10-09 MED ORDER — SODIUM CHLORIDE 0.9 % IV BOLUS (SEPSIS)
1000.0000 mL | Freq: Once | INTRAVENOUS | Status: AC
  Administered 2019-10-09: 1000 mL via INTRAVENOUS

## 2019-10-09 MED ORDER — PIPERACILLIN-TAZOBACTAM 3.375 G IVPB 30 MIN
3.3750 g | Freq: Once | INTRAVENOUS | Status: AC
  Administered 2019-10-09: 3.375 g via INTRAVENOUS
  Filled 2019-10-09 (×2): qty 50

## 2019-10-09 MED ORDER — MORPHINE SULFATE (PF) 2 MG/ML IV SOLN
1.0000 mg | INTRAVENOUS | Status: DC | PRN
  Administered 2019-10-10 – 2019-10-11 (×10): 1 mg via INTRAVENOUS
  Filled 2019-10-09 (×10): qty 1

## 2019-10-09 MED ORDER — POLYETHYLENE GLYCOL 3350 17 G PO PACK
17.0000 g | PACK | Freq: Every day | ORAL | Status: DC
  Administered 2019-10-13 – 2019-10-15 (×3): 17 g via ORAL
  Filled 2019-10-09 (×3): qty 1

## 2019-10-09 MED ORDER — POTASSIUM CHLORIDE 10 MEQ/100ML IV SOLN
10.0000 meq | INTRAVENOUS | Status: AC
  Administered 2019-10-09 (×4): 10 meq via INTRAVENOUS
  Filled 2019-10-09 (×4): qty 100

## 2019-10-09 MED ORDER — FOLIC ACID 1 MG PO TABS
1.0000 mg | ORAL_TABLET | Freq: Every day | ORAL | Status: DC
  Administered 2019-10-10 – 2019-10-15 (×6): 1 mg via ORAL
  Filled 2019-10-09 (×6): qty 1

## 2019-10-09 MED ORDER — POLYETHYLENE GLYCOL 3350 17 G PO PACK: 17.0000 g | PACK | Freq: Every day | ORAL | Status: DC | PRN

## 2019-10-09 NOTE — ED Provider Notes (Addendum)
[facility] EMERGENCY DEPARTMENT Provider Note   CSN: [id number] Arrival date & time: 10/08/19  2246     History Chief Complaint  Patient presents with  . Shortness of Breath    Victor Torres is a 49 y.o. male.  The history is provided by the patient.  Shortness of Breath Severity:  Moderate Timing:  Constant Progression:  Worsening Chronicity:  Recurrent Context: not fumes   Relieved by:  Nothing Worsened by:  Nothing Ineffective treatments:  None tried Associated symptoms: cough and fever   Associated symptoms: no abdominal pain, no chest pain, no diaphoresis, no hemoptysis, no neck pain, no rash, no vomiting and no wheezing   Associated symptoms comment:  With yellow purulent sputum  Cough:    Cough characteristics:  Productive   Sputum characteristics:  Yellow   Severity:  Moderate   Onset quality:  Gradual   Duration:  2 days   Timing:  Intermittent   Progression:  Worsening   Chronicity:  Recurrent Risk factors: no obesity   Admitted for PNA and empyema 23-29, home on antibiotics and cough and sputum and feverishness have returned. No CP.  No DOE.       Past Medical History:  Diagnosis Date  . Alcohol abuse     Patient Active Problem List   Diagnosis Date Noted  . CAP (community acquired pneumonia) 10/09/2019  . Malnutrition of moderate degree 09/24/2019  . Alcohol abuse 09/24/2019  . Tobacco abuse 09/24/2019  . Hypokalemia   . Sepsis (HCC) 09/22/2019  . Acute respiratory failure with hypoxia (HCC) 09/22/2019  . Chest pain 09/22/2019  . Hyponatremia 09/22/2019  . Empyema (HCC) 09/21/2019    History reviewed. No pertinent surgical history.     History reviewed. No pertinent family history.  Social History   Tobacco Use  . Smoking status: Current Every Day Smoker    Types: Cigarettes  . Smokeless tobacco: Never Used  Substance Use Topics  . Alcohol use: Not Currently  . Drug use: Yes    Types: Methamphetamines    Home  Medications Prior to Admission medications   Medication Sig Start Date End Date Taking? Authorizing Provider  albuterol (VENTOLIN HFA) 108 (90 Base) MCG/ACT inhaler Inhale 1 puff into the lungs every 8 (eight) hours as needed for wheezing or shortness of breath. 09/27/19   Rodolph Bonghompson, Daniel V, MD  cefdinir (OMNICEF) 300 MG capsule Take 1 capsule (300 mg total) by mouth every 12 (twelve) hours for 21 days. 09/27/19 10/18/19  Rodolph Bonghompson, Daniel V, MD  folic acid (FOLVITE) 1 MG tablet Take 1 tablet (1 mg total) by mouth daily. 09/28/19   Rodolph Bonghompson, Daniel V, MD  ibuprofen (ADVIL) 400 MG tablet Take 1 tablet (400 mg total) by mouth every 8 (eight) hours as needed for moderate pain. 09/27/19   Rodolph Bonghompson, Daniel V, MD  Multiple Vitamin (MULTIVITAMIN WITH MINERALS) TABS tablet Take 1 tablet by mouth daily. 09/28/19   Rodolph Bonghompson, Daniel V, MD  nicotine (NICODERM CQ - DOSED IN MG/24 HOURS) 21 mg/24hr patch Place 1 patch (21 mg total) onto the skin daily. 09/28/19   Rodolph Bonghompson, Daniel V, MD  pantoprazole (PROTONIX) 40 MG tablet Take 1 tablet (40 mg total) by mouth daily. 09/28/19   Rodolph Bonghompson, Daniel V, MD  polyethylene glycol (MIRALAX / GLYCOLAX) 17 g packet Take 17 g by mouth daily as needed for mild constipation. 09/27/19   Rodolph Bonghompson, Daniel V, MD  thiamine 100 MG tablet Take 1 tablet (100 mg total) by mouth daily.  09/28/19   Rodolph Bong, MD    Allergies    Patient has no known allergies.  Review of Systems   Review of Systems  Constitutional: Positive for fever. Negative for diaphoresis.  HENT: Negative for congestion.   Eyes: Negative for visual disturbance.  Respiratory: Positive for cough and shortness of breath. Negative for hemoptysis and wheezing.   Cardiovascular: Negative for chest pain.  Gastrointestinal: Negative for abdominal pain and vomiting.  Genitourinary: Negative for difficulty urinating.  Musculoskeletal: Negative for neck pain.  Skin: Negative for rash.  Neurological: Negative for  dizziness.  Psychiatric/Behavioral: Negative for agitation.  All other systems reviewed and are negative.   Physical Exam Updated Vital Signs BP (!) 113/51   Pulse 98   Temp 98.2 F (36.8 C) (Oral)   Resp (!) 26   SpO2 100%   Physical Exam Vitals and nursing note reviewed.  Constitutional:      Appearance: He is normal weight. He is not diaphoretic.  HENT:     Head: Normocephalic and atraumatic.     Nose: Nose normal.  Eyes:     Conjunctiva/sclera: Conjunctivae normal.     Pupils: Pupils are equal, round, and reactive to light.  Cardiovascular:     Rate and Rhythm: Normal rate and regular rhythm.     Pulses: Normal pulses.     Heart sounds: Normal heart sounds.  Pulmonary:     Breath sounds: Rales present.     Comments: Decreased at the base Abdominal:     General: Abdomen is flat. Bowel sounds are normal.     Tenderness: There is no abdominal tenderness. There is no guarding.  Musculoskeletal:        General: Normal range of motion.     Cervical back: Normal range of motion and neck supple.  Skin:    General: Skin is warm and dry.     Capillary Refill: Capillary refill takes less than 2 seconds.  Neurological:     General: No focal deficit present.     Mental Status: He is alert and oriented to person, place, and time.     Deep Tendon Reflexes: Reflexes normal.  Psychiatric:        Mood and Affect: Mood normal.        Behavior: Behavior normal.     ED Results / Procedures / Treatments   Labs (all labs ordered are listed, but only abnormal results are displayed) Results for orders placed or performed during the hospital encounter of 10/08/19  CBC with Differential  Result Value Ref Range   WBC 25.2 (H) 4.0 - 10.5 K/uL   RBC 3.41 (L) 4.22 - 5.81 MIL/uL   Hemoglobin 10.7 (L) 13.0 - 17.0 g/dL   HCT 25.9 (L) 56.3 - 87.5 %   MCV 94.1 80.0 - 100.0 fL   MCH 31.4 26.0 - 34.0 pg   MCHC 33.3 30.0 - 36.0 g/dL   RDW 64.3 (H) 32.9 - 51.8 %   Platelets 372 150 - 400  K/uL   nRBC 0.0 0.0 - 0.2 %   Neutrophils Relative % 86 %   Neutro Abs 21.6 (H) 1.7 - 7.7 K/uL   Lymphocytes Relative 5 %   Lymphs Abs 1.3 0.7 - 4.0 K/uL   Monocytes Relative 7 %   Monocytes Absolute 1.8 (H) 0.1 - 1.0 K/uL   Eosinophils Relative 0 %   Eosinophils Absolute 0.0 0.0 - 0.5 K/uL   Basophils Relative 0 %   Basophils Absolute 0.1  0.0 - 0.1 K/uL   WBC Morphology MILD LEFT SHIFT (1-5% METAS, OCC MYELO, OCC BANDS)    RBC Morphology MORPHOLOGY UNREMARKABLE    Smear Review Normal platelet morphology    Immature Granulocytes 2 %   Abs Immature Granulocytes 0.40 (H) 0.00 - 0.07 K/uL  Basic metabolic panel  Result Value Ref Range   Sodium 130 (L) 135 - 145 mmol/L   Potassium 3.1 (L) 3.5 - 5.1 mmol/L   Chloride 93 (L) 98 - 111 mmol/L   CO2 23 22 - 32 mmol/L   Glucose, Bld 177 (H) 70 - 99 mg/dL   BUN 32 (H) 6 - 20 mg/dL   Creatinine, Ser 7.61 0.61 - 1.24 mg/dL   Calcium 8.4 (L) 8.9 - 10.3 mg/dL   GFR calc non Af Amer >60 >60 mL/min   GFR calc Af Amer >60 >60 mL/min   Anion gap 14 5 - 15  Troponin I (High Sensitivity)  Result Value Ref Range   Troponin I (High Sensitivity) 23 (H) <18 ng/L   DG Chest 1 View  Result Date: 09/23/2019 CLINICAL DATA:  Hypoxia EXAM: CHEST  1 VIEW COMPARISON:  Sep 22, 2019 chest radiograph and chest CT Sep 21, 2019 FINDINGS: Endotracheal tube tip is 5.9 cm above the carina. Nasogastric tube tip and side port are below the diaphragm. Chest tube is present on the left, stable from 1 day prior. No evident pneumothorax. Ill-defined airspace opacity remains in the left lower lung region with consolidation medially. Small left pleural effusion evident. Right lung is clear. Heart is mildly enlarged with pulmonary vascularity normal. No adenopathy. No bone lesions. IMPRESSION: Tube and catheter positions as described without pneumothorax. There has been apparent partial drainage of empyema on the left with ill-defined opacity remaining in the left lower lung  region with small pleural effusion. Consolidation medial left base present, likely due to combination of atelectasis and suspected pneumonia. Right lung clear.  Stable cardiac prominence. Electronically Signed   By: Bretta Bang III M.D.   On: 09/23/2019 08:38   CT CHEST W CONTRAST  Result Date: 09/26/2019 CLINICAL DATA:  49 year old male with shortness of breath and empyema. EXAM: CT CHEST WITH CONTRAST TECHNIQUE: Multidetector CT imaging of the chest was performed during intravenous contrast administration. CONTRAST:  4mL OMNIPAQUE IOHEXOL 300 MG/ML  SOLN COMPARISON:  Chest CT dated 09/21/2019. FINDINGS: Cardiovascular: There is no cardiomegaly or pericardial effusion. Coronary vascular calcification with severe atherosclerotic calcification of the LAD, advanced for the patient's age. Mild atherosclerotic calcification of the thoracic aorta. The origins of the great vessels of the aortic arch appear patent. The central pulmonary arteries appear unremarkable. Mediastinum/Nodes: There is no hilar or mediastinal adenopathy. The esophagus is grossly unremarkable. No mediastinal fluid collection. Lungs/Pleura: Left pleural pigtail drainage catheter with near complete resolution of the previously seen collection. There is a small amount of air within the left pleural space, likely introduced by the catheter. Small left and probable trace right pleural effusions. Diffuse bilateral ground-glass nodular opacities most consistent with multifocal pneumonia, clinical correlation is recommended. Patchy area of consolidation in the left lower lobe may represent atelectasis or pneumonia. There is background of centrilobular emphysema. The central airways are patent. Upper Abdomen: No acute abnormality. Musculoskeletal: Degenerative changes of the spine. No acute osseous pathology. IMPRESSION: 1. Left pleural pigtail drainage catheter with near complete resolution of the previously seen collection. 2. Diffuse bilateral  ground-glass nodular opacities most consistent with multifocal pneumonia. Clinical correlation and follow-up to resolution recommended.  3. Small left and probable trace right pleural effusions. 4. Aortic Atherosclerosis (ICD10-I70.0) and Emphysema (ICD10-J43.9). 5. Severe atherosclerotic calcification of the LAD, advanced for patient's age. Electronically Signed   By: Elgie Collard M.D.   On: 09/26/2019 20:51   CT Angio Chest PE W and/or Wo Contrast  Result Date: 09/21/2019 CLINICAL DATA:  Shortness of breath. EXAM: CT ANGIOGRAPHY CHEST WITH CONTRAST TECHNIQUE: Multidetector CT imaging of the chest was performed using the standard protocol during bolus administration of intravenous contrast. Multiplanar CT image reconstructions and MIPs were obtained to evaluate the vascular anatomy. CONTRAST:  OMNIPAQUE IOHEXOL 350 MG/ML SOLN COMPARISON:  None. FINDINGS: Cardiovascular: The subsegmental pulmonary arteries are limited in evaluation secondary to suboptimal opacification with intravenous contrast. This is most notable within the bilateral lung bases. No intraluminal filling defects are clearly identified. Normal heart size. No pericardial effusion. Mediastinum/Nodes: There is mild precarinal lymphadenopathy. Thyroid gland, trachea, and esophagus demonstrate no significant findings. Lungs/Pleura: A 14.0 cm x 7.8 cm thick walled (approximately 1.3 cm) cavitary lesion is seen within the anterolateral aspect of the mid and lower left lung. This is increased in size when compared to the prior exam. A single large air-fluid level is seen. A marked amount of adjacent posterior atelectasis and/or infiltrate is seen within the right lung base. This is increased in severity when compared to the prior study. Mild, slightly nodular appearing infiltrate is seen within the inferior aspect of the right middle lobe. This represents a new finding when compared to the prior exam. There is no evidence of a pleural effusion  or pneumothorax. Upper Abdomen: No acute abnormality. Musculoskeletal: No chest wall abnormality. No acute or significant osseous findings. Review of the MIP images confirms the above findings. IMPRESSION: 1. A 14.0 cm x 7.8 cm thick walled (approximately 1.3 cm) cavitary lesion within the anterolateral aspect of the mid and lower left lung, increased in size when compared to the prior exam. This is likely consistent with an evolving empyema. 2. Marked severity posterior right basilar atelectasis and/or infiltrate. 3. Mild, slightly nodular appearing right middle lobe infiltrate. This represents a new finding when compared to the prior exam. Electronically Signed   By: Aram Candela M.D.   On: 09/21/2019 18:31   DG Chest Port 1 View  Result Date: 10/08/2019 CLINICAL DATA:  Worsening shortness of breath. EXAM: PORTABLE CHEST 1 VIEW COMPARISON:  Sep 25, 2019 FINDINGS: The left-sided chest tube seen on the prior study has been removed. Marked severity infiltrates are seen within the mid left lung and left lung base. A small to moderate size left pleural effusion is noted. A trace amount of pleural fluid is seen on the right. No pneumothorax is identified. The heart size and mediastinal contours are within normal limits. The visualized skeletal structures are unremarkable. IMPRESSION: 1. Marked severity infiltrates within the mid left lung and left lung base. 2. Small to moderate size left pleural effusion. 3. Trace amount of right pleural fluid. Electronically Signed   By: Aram Candela M.D.   On: 10/08/2019 23:28   DG Chest Port 1 View  Result Date: 09/25/2019 CLINICAL DATA:  Chest tube placement EXAM: PORTABLE CHEST 1 VIEW COMPARISON:  Sep 23, 2019 FINDINGS: Normal cardiac silhouette. LEFT chest tube in place without pneumothorax. LEFT basilar atelectasis noted. Small LEFT effusion. RIGHT lung clear. IMPRESSION: 1. LEFT chest tube in place.  No pneumothorax. 2.  LEFT basilar atelectasis and effusion.  Electronically Signed   By: Loura Halt.D.  On: 09/25/2019 08:54   DG CHEST PORT 1 VIEW  Result Date: 09/22/2019 CLINICAL DATA:  Empyema, ET placement, OG tube, left chest tube placement EXAM: PORTABLE CHEST 1 VIEW COMPARISON:  CT 09/21/2019, radiograph 09/21/2019 FINDINGS: *Endotracheal tube is position in the mid to upper trachea, 7.4 cm from the carina. *Transesophageal tube tip and side port are below the level of imaging, likely beyond the GE junction. *Pigtail pleural drain is seen along the left pleural in the vicinity of the complex empyema seen on comparison cross-sectional imaging. Significant interval decrease in size of the air and fluid containing pleural collection in the left lung with some residual basilar opacity likely reflecting combination of residual pleural disease, consolidation and atelectasis. The right lung is predominantly clear. No right pneumothorax or pleural fluid. Cardiac contours are grossly unchanged accounting for opacity obscuring portion of the left heart border. No acute osseous or soft tissue abnormality. IMPRESSION: Pigtail pleural drain along the left pleural likely within the superior extent of the complex empyema seen on comparison cross-sectional imaging. Significant interval decrease in size of the air and fluid containing pleural collection in the left lung with some residual basilar opacity likely reflecting combination of residual pleural disease, consolidation and atelectasis. Electronically Signed   By: Kreg Shropshire M.D.   On: 09/22/2019 04:46   DG Chest Portable 1 View  Result Date: 09/21/2019 CLINICAL DATA:  Elevated white blood cell count. EXAM: PORTABLE CHEST 1 VIEW COMPARISON:  CT scan Sep 15, 2019 FINDINGS: A rounded collection of air in the lower left hemithorax is at the site of the suspected empyema seen on the CT scan of the chest from Sep 15, 2019. There appears to be more air in the collection today but there is also fluid. Opacity in the  left base is identified. No pneumothorax. The right lung is clear. The cardiomediastinal silhouette is otherwise stable. IMPRESSION: 1. Suspected empyema in the left base with more air but persistent fluid. Suspected opacity adjacent to the empyema could be pneumonia or atelectasis. CT imaging could better evaluate. Electronically Signed   By: Gerome Sam III M.D   On: 09/21/2019 17:01   ECHOCARDIOGRAM COMPLETE  Result Date: 09/22/2019    ECHOCARDIOGRAM REPORT   Patient Name:   Victor Torres Date of Exam: 09/22/2019 Medical Rec #:  [id number]     Height:       73.0 in Accession #:    [id number]    Weight:       180.1 lb Date of Birth:  01/06/1971    BSA:          2.058 m Patient Age:    48 years      BP:           100/53 mmHg Patient Gender: M             HR:           57 bpm. Exam Location:  Inpatient Procedure: 2D Echo Indications:    abnormal ECG  History:        Patient has no prior history of Echocardiogram examinations.                 Risk Factors:Current Smoker. Alcohol abuse.  Sonographer:    Celene Skeen RDCS (AE) Referring Phys: [id number] John Giovanni  Sonographer Comments: Echo performed with patient supine and on artificial respirator. Image acquisition challenging due to respiratory motion. restricted mobility. positioning not ideal IMPRESSIONS  1. Left ventricular ejection fraction, by estimation,  is 60 to 65%. The left ventricle has normal function. The left ventricle has no regional wall motion abnormalities. Left ventricular diastolic parameters were normal.  2. Right ventricular systolic function is normal. The right ventricular size is normal.  3. Right atrial size was mildly dilated.  4. The mitral valve is normal in structure. Trivial mitral valve regurgitation.  5. The aortic valve is grossly normal. Aortic valve regurgitation is not visualized. No aortic stenosis is present.  6. Aortic dilatation noted. 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  FINDINGS  Left Ventricle: Left ventricular ejection fraction, by estimation, is 60 to 65%. The left ventricle has normal function. The left ventricle has no regional wall motion abnormalities. The left ventricular internal cavity size was normal in size. There is  no left ventricular hypertrophy. Left ventricular diastolic parameters were normal. Right Ventricle: The right ventricular size is normal. No increase in right ventricular wall thickness. Right ventricular systolic function is normal. Left Atrium: Left atrial size was normal in size. Right Atrium: Right atrial size was mildly dilated. Pericardium: There is no evidence of pericardial effusion. Mitral Valve: The mitral valve is normal in structure. Trivial mitral valve regurgitation. MV peak gradient, 19.7 mmHg. The mean mitral valve gradient is 7.0 mmHg. Tricuspid Valve: The tricuspid valve is normal in structure. Tricuspid valve regurgitation is not demonstrated. No evidence of tricuspid stenosis. Aortic Valve: The aortic valve is grossly normal. Aortic valve regurgitation is not visualized. Aortic regurgitation PHT measures 426 msec. No aortic stenosis is present. Pulmonic Valve: The pulmonic valve was normal in structure. Pulmonic valve regurgitation is not visualized. No evidence of pulmonic stenosis. Aorta: The aortic root was not well visualized and aortic dilatation noted. 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 IAS/Shunts: The atrial septum is grossly normal.  LEFT VENTRICLE PLAX 2D LVIDd:         5.30 cm  Diastology LVIDs:         3.40 cm  LV e' lateral: 11.00 cm/s LV PW:         1.10 cm  LV e' medial:  6.74 cm/s LV IVS:        1.20 cm LVOT diam:     2.45 cm LV SV:         115 LV SV Index:   56 LVOT Area:     4.71 cm  RIGHT VENTRICLE TAPSE (M-mode): 2.9 cm LEFT ATRIUM             Index       RIGHT ATRIUM           Index LA diam:        3.00 cm 1.46 cm/m  RA Area:     20.80 cm LA Vol (A2C):   41.4 ml 20.12 ml/m  RA Volume:   51.70 ml  25.12 ml/m LA Vol (A4C):   26.0 ml 12.63 ml/m LA Biplane Vol: 32.4 ml 15.74 ml/m  AORTIC VALVE LVOT Vmax:   107.00 cm/s LVOT Vmean:  68.600 cm/s LVOT VTI:    0.243 m AI PHT:      426 msec  AORTA Ao Root diam: 4.00 cm MITRAL VALVE MV Peak grad: 19.7 mmHg  SHUNTS MV Mean grad: 7.0 mmHg   Systemic VTI:  0.24 m MV Vmax:      2.22 m/s   Systemic Diam: 2.45 cm MV Vmean:     122.0 cm/s Kristeen Miss MD Electronically  signed by Mertie Moores MD Signature Date/Time: 09/22/2019/2:41:06 PM    Final    US Abdomen Limited RUQ  Result Date: 09/22/2019 CLINICAL DATA:  Elevated LFTs EXAM: ULTRASOUND ABDOMEN LIMITED RIGHT UPPER QUADRANT COMPARISON:  None available FINDINGS: Gallbladder: No gallstones or wall thickening visualized. Murphy sign cannot be assessed due to patient condition. Common bile duct: Diameter: 5-6 mm.  Where visualized, no filling defect. Liver: No focal lesion identified. Within normal limits in parenchymal echogenicity. Portal vein is patent on color Doppler imaging with normal direction of blood flow towards the liver. IMPRESSION: Negative right upper quadrant ultrasound Electronically Signed   By: Monte Fantasia M.D.   On: 09/22/2019 09:40  \  EKG None  Radiology DG Chest Port 1 View  Result Date: 10/08/2019 CLINICAL DATA:  Worsening shortness of breath. EXAM: PORTABLE CHEST 1 VIEW COMPARISON:  Sep 25, 2019 FINDINGS: The left-sided chest tube seen on the prior study has been removed. Marked severity infiltrates are seen within the mid left lung and left lung base. A small to moderate size left pleural effusion is noted. A trace amount of pleural fluid is seen on the right. No pneumothorax is identified. The heart size and mediastinal contours are within normal limits. The visualized skeletal structures are unremarkable. IMPRESSION: 1. Marked severity infiltrates within the mid left lung and left lung base. 2. Small to moderate size left pleural effusion. 3. Trace amount  of right pleural fluid. Electronically Signed   By: Virgina Norfolk M.D.   On: 10/08/2019 23:28    Procedures Procedures (including critical care time)  Medications Ordered in ED Medications  potassium chloride 10 mEq in 100 mL IVPB (has no administration in time range)  0.9 %  sodium chloride infusion (has no administration in time range)  vancomycin (VANCOCIN) IVPB 1000 mg/200 mL premix ( Intravenous Stopped 10/09/19 0144)  piperacillin-tazobactam (ZOSYN) IVPB 3.375 g (0 g Intravenous Stopped 10/09/19 0041)    ED Course  I have reviewed the triage vital signs and the nursing notes.  Pertinent labs & imaging results that were available during my care of the patient were reviewed by me and considered in my medical decision making (see chart for details).    Admit to medicine for HCAP and empyema.  No sepsis at this time.    440 Case d/w cardiology fellow elevated troponin.  No heparin as patient likely needs procedures for empyema and this may be due to stress of infection.      Will hold heparin per cardiology recommendation   Final Clinical Impression(s) / ED Diagnoses Final diagnoses:  HCAP (healthcare-associated pneumonia)  Empyema ([place])    Admit to medicine    Julitza Rickles, MD 10/09/19 0038    Siler Mavis, MD 10/09/19 3710

## 2019-10-09 NOTE — Consult Note (Signed)
NAME:  Victor Torres, MRN:  [id number], DOB:  06/13/1970, LOS: 0 ADMISSION DATE:  10/08/2019, CONSULTATION DATE: June 10 REFERRING MD: Henrene Hawking, CHIEF COMPLAINT: Dyspnea  Brief History   49 year old male recently treated for empyema returned on June 10 complaining of dyspnea with a recurrent left-sided pleural effusion  History of present illness   This is a 49 year old male who was recently treated in our facility in the setting of a streptococcal empyema in the left lung.  He briefly required mechanical ventilation, he had a chest tube placed and pulmonary and critical care medicine followed.  His chest x-ray cleared, his chest tube was removed, and he was eventually discharged home.  He said he was doing fine until 3 days ago when he started to develop the sensation that his chest was filling up with fluid again.  He said that he began to cough up foul-smelling mucus, he had a bad taste in his mouth, he felt fullness and tightness in his chest and increasing shortness of breath.  He has had fevers and chills at home.  Today in the emergency room he was noted to have recurrent left-sided pleural effusion.  He denies rash, nausea, vomiting, diarrhea.   Past Medical History  Alcohol abuse Tobacco abuse Recent empyema  Significant Hospital Events   May 24 PCCM consult, intubation, chest tube placement> chest tube removed on May 29 June 10 admitted  Consults:  PCCM  Procedures:    Significant Diagnostic Tests:  6/10 CT Chest >>>>  Micro Data:  5/23 Sars-Cov-2>>negative 5/23 MRSA screen-negative 5/23 Urine culture>> 5/23 Blood culture>> 5/23 Body fluid culture (empyema)-ABUNDANT STREPTOCOCCUS CONSTELLATUS   June 10 SARS-CoV-2> negative June 10 blood culture>   Antimicrobials:  5/23 vanc>  5/23 flagyl >  5/23 cefepime >   Interim history/subjective:    Objective   Blood pressure (!) 132/51, pulse 96, temperature 99.6 F (37.6 C), temperature source Oral, resp. rate (!)  22, height 6\' 1"  (1.854 m), weight 75.5 kg, SpO2 99 %.        Intake/Output Summary (Last 24 hours) at 10/09/2019 1822 Last data filed at 10/09/2019 1117 Gross per 24 hour  Intake 569.58 ml  Output 600 ml  Net -30.42 ml   Filed Weights   10/09/19 1738  Weight: 75.5 kg    Examination:  General:  Slight tachypnea, no accessory muscle use, speaking in full sentences HENT: NCAT OP clear PULM: diminished left base B, normal effort CV: RRR, no mgr GI: BS+, soft, nontender MSK: normal bulk and tone Neuro: awake, alert, no distress, MAEW  June 10 chest x-ray images independently reviewed showing a moderate to large left-sided pleural effusion   Resolved Hospital Problem list     Assessment & Plan:  Acute respiratory failure with hypoxemia in the setting of most likely recurrent empyema, differential includes healthcare associated pneumonia: Offered drainage vs thoracentesis or chest tube tonight, he refused but stated he would likely be willing to do this in the morning.  Would prefer to just have one procedure.  N.p.o. after midnight CT chest tonight Pigtail chest tube versus thoracentesis in a.m., other treatment options may include VATS depending on results of the CT scan Will add cefepime to cover healthcare associated pneumonia Titrate O2 to maintain O2 saturation > 88%  Best practice:   Per [facility]  Labs   CBC: Recent Labs  Lab 10/08/19 2326 10/09/19 1714  WBC 25.2* 20.2*  NEUTROABS 21.6*  --   HGB 10.7* 8.9*  HCT 32.1*  27.1*  MCV 94.1 95.1  PLT 372 330    Basic Metabolic Panel: Recent Labs  Lab 10/08/19 2326 10/09/19 1714  NA 130* 132*  K 3.1* 4.1  CL 93* 98  CO2 23 26  GLUCOSE 177* 111*  BUN 32* 17  CREATININE 1.12 0.68  CALCIUM 8.4* 7.8*   GFR: Estimated Creatinine Clearance: 119.3 mL/min (by C-G formula based on SCr of 0.68 mg/dL). Recent Labs  Lab 10/08/19 2326 10/09/19 1714  WBC 25.2* 20.2*    Liver Function Tests: No results for  input(s): AST, ALT, ALKPHOS, BILITOT, PROT, ALBUMIN in the last 168 hours. No results for input(s): LIPASE, AMYLASE in the last 168 hours. No results for input(s): AMMONIA in the last 168 hours.  ABG    Component Value Date/Time   PHART 7.338 (L) 09/22/2019 0537   PCO2ART 47.3 09/22/2019 0537   PO2ART 58 (L) 09/22/2019 0537   HCO3 25.6 09/22/2019 0537   TCO2 27 09/22/2019 0537   ACIDBASEDEF 1.0 09/22/2019 0537   O2SAT 89.0 09/22/2019 0537     Coagulation Profile: No results for input(s): INR, PROTIME in the last 168 hours.  Cardiac Enzymes: No results for input(s): CKTOTAL, CKMB, CKMBINDEX, TROPONINI in the last 168 hours.  HbA1C: Hgb A1c MFr Bld  Date/Time Value Ref Range Status  09/22/2019 06:07 AM 6.3 (H) 4.8 - 5.6 % Final    Comment:    (NOTE) Pre diabetes:          5.7%-6.4% Diabetes:              >6.4% Glycemic control for   <7.0% adults with diabetes     CBG: No results for input(s): GLUCAP in the last 168 hours.  Review of Systems:   Gen: per HPI HEENT: Denies blurred vision, double vision, hearing loss, tinnitus, sinus congestion, rhinorrhea, sore throat, neck stiffness, dysphagia PULM: per HPI CV: Denies chest pain, edema, orthopnea, paroxysmal nocturnal dyspnea, palpitations GI: Denies abdominal pain, nausea, vomiting, diarrhea, hematochezia, melena, constipation, change in bowel habits GU: Denies dysuria, hematuria, polyuria, oliguria, urethral discharge Endocrine: Denies hot or cold intolerance, polyuria, polyphagia or appetite change Derm: Denies rash, dry skin, scaling or peeling skin change Heme: Denies easy bruising, bleeding, bleeding gums Neuro: Denies headache, numbness, weakness, slurred speech, loss of memory or consciousness   Past Medical History  He,  has a past medical history of Alcohol abuse.   Surgical History   History reviewed. No pertinent surgical history.   Social History   reports that he has been smoking cigarettes. He has  never used smokeless tobacco. He reports previous alcohol use. He reports current drug use. Drug: Methamphetamines.   Family History   His family history is not on file.   Allergies No Known Allergies   Home Medications  Prior to Admission medications   Medication Sig Start Date End Date Taking? Authorizing Provider  albuterol (VENTOLIN HFA) 108 (90 Base) MCG/ACT inhaler Inhale 1 puff into the lungs every 8 (eight) hours as needed for wheezing or shortness of breath. 09/27/19  Yes Rodolph Bong, MD  cefdinir (OMNICEF) 300 MG capsule Take 1 capsule (300 mg total) by mouth every 12 (twelve) hours for 21 days. Patient taking differently: Take 300 mg by mouth every 12 (twelve) hours. Start date 09/27/19 09/27/19 10/18/19 Yes Rodolph Bong, MD  folic acid (FOLVITE) 1 MG tablet Take 1 tablet (1 mg total) by mouth daily. 09/28/19  Yes Rodolph Bong, MD  Multiple Vitamin (MULTIVITAMIN WITH MINERALS)  TABS tablet Take 1 tablet by mouth daily. 09/28/19  Yes Eugenie Filler, MD  pantoprazole (PROTONIX) 40 MG tablet Take 1 tablet (40 mg total) by mouth daily. 09/28/19  Yes Eugenie Filler, MD  ibuprofen (ADVIL) 400 MG tablet Take 1 tablet (400 mg total) by mouth every 8 (eight) hours as needed for moderate pain. Patient not taking: Reported on 10/09/2019 09/27/19   Eugenie Filler, MD  nicotine (NICODERM CQ - DOSED IN MG/24 HOURS) 21 mg/24hr patch Place 1 patch (21 mg total) onto the skin daily. Patient not taking: Reported on 10/09/2019 09/28/19   Eugenie Filler, MD  polyethylene glycol (MIRALAX / GLYCOLAX) 17 g packet Take 17 g by mouth daily as needed for mild constipation. Patient not taking: Reported on 10/09/2019 09/27/19   Eugenie Filler, MD  thiamine 100 MG tablet Take 1 tablet (100 mg total) by mouth daily. Patient not taking: Reported on 10/09/2019 09/28/19   Eugenie Filler, MD     Critical care time: n/a    Roselie Awkward, MD [place] [facility] Pager: [phone] Cell:  [phone] If no response, call [phone]

## 2019-10-09 NOTE — H&P (Signed)
History and Physical        Hospital Admission Note Date: 10/09/2019  Patient name: Victor Torres Medical record number: [id number] Date of birth: Aug 31, 1970 Age: 49 y.o. Gender: male  PCP: Patient, No Pcp Per  Patient coming from: home At baseline, ambulates: independently  Chief Complaint    Chief Complaint  Patient presents with  . Shortness of Breath      HPI:   This is a 49 year old male with past medical history of tobacco use, alcohol abuse, recent admission from 5/23-5/29 for cute hypoxic respiratory failure and sepsis secondary to left sided empyema and pneumonia found to be positive for Streptococcus constellatus at which time he had a chest tube placed and was treated with IV antibiotics and discharged on p.o. antibiotics however has since had worsening symptoms.  He was supposed to follow-up in the office on 6/14 but due to his worsening symptoms once of breath and subjective fevers with productive cough and contacted his PCP who recommended patient be seen either by urgent care or ED and [facility].  He had a chest x-ray which showed marked severity infiltrates within the mid left lung and base and small to moderate-sized left pleural effusion with trace amount of right pleural fluid.  Additionally at [facility] patient had noted elevated troponin from 23->106 and cardiology was consulted by ED physician who did not recommend starting on heparin and thought this was due to demand.  Patient was transferred to [facility] for further management.  Patient also complaining of right lower quadrant abdominal pain as well as constipation.  States he has not had much to eat or drink and has not had alcohol recently either.  Has been relatively nonambulatory due to his shortness of breath.   Notable ED labs: Sodium 130, potassium 3.1, chloride 93, glucose 177, BUN  32, creatinine 1.12, WBC 25, Hb 10.7, troponin 23-> 106 ED treatments: Potassium, vancomycin and Zosyn and started on normal saline maintenance ED imaging: chest x-ray which showed marked severity infiltrates within the mid left lung and base and small to moderate-sized left pleural effusion with trace amount of right pleural fluid. ED vitals:  Vitals:   10/09/19 1553 10/09/19 1706  BP: 138/74 (!) 132/51  Pulse: 86 96  Resp: (!) 24 (!) 22  Temp: 98.4 F (36.9 C) 99.6 F (37.6 C)  SpO2: 98% 99%     Review of Systems:  Review of Systems  Constitutional: Positive for chills, fever and malaise/fatigue.  Respiratory: Positive for cough, sputum production and shortness of breath.   Cardiovascular: Negative for palpitations.       Left chest wall pain  Gastrointestinal: Positive for abdominal pain and constipation. Negative for blood in stool, diarrhea, nausea and vomiting.  Genitourinary: Negative for dysuria and hematuria.  Musculoskeletal: Negative.   Skin:       Perirectal skin pain  Neurological: Negative.   Psychiatric/Behavioral: Negative.   All other systems reviewed and are negative.   Medical/Social/Family History   Past Medical History: Past Medical History:  Diagnosis Date  . Alcohol abuse     History reviewed. No pertinent surgical history.  Medications: Prior to Admission medications   Medication Sig Start Date  End Date Taking? Authorizing Provider  albuterol (VENTOLIN HFA) 108 (90 Base) MCG/ACT inhaler Inhale 1 puff into the lungs every 8 (eight) hours as needed for wheezing or shortness of breath. 09/27/19  Yes Rodolph Bong, MD  cefdinir (OMNICEF) 300 MG capsule Take 1 capsule (300 mg total) by mouth every 12 (twelve) hours for 21 days. Patient taking differently: Take 300 mg by mouth every 12 (twelve) hours. Start date 09/27/19 09/27/19 10/18/19 Yes Rodolph Bong, MD  folic acid (FOLVITE) 1 MG tablet Take 1 tablet (1 mg total) by mouth daily. 09/28/19   Yes Rodolph Bong, MD  Multiple Vitamin (MULTIVITAMIN WITH MINERALS) TABS tablet Take 1 tablet by mouth daily. 09/28/19  Yes Rodolph Bong, MD  pantoprazole (PROTONIX) 40 MG tablet Take 1 tablet (40 mg total) by mouth daily. 09/28/19  Yes Rodolph Bong, MD  ibuprofen (ADVIL) 400 MG tablet Take 1 tablet (400 mg total) by mouth every 8 (eight) hours as needed for moderate pain. Patient not taking: Reported on 10/09/2019 09/27/19   Rodolph Bong, MD  nicotine (NICODERM CQ - DOSED IN MG/24 HOURS) 21 mg/24hr patch Place 1 patch (21 mg total) onto the skin daily. Patient not taking: Reported on 10/09/2019 09/28/19   Rodolph Bong, MD  polyethylene glycol (MIRALAX / GLYCOLAX) 17 g packet Take 17 g by mouth daily as needed for mild constipation. Patient not taking: Reported on 10/09/2019 09/27/19   Rodolph Bong, MD  thiamine 100 MG tablet Take 1 tablet (100 mg total) by mouth daily. Patient not taking: Reported on 10/09/2019 09/28/19   Rodolph Bong, MD    Allergies:  No Known Allergies  Social History:  reports that he has been smoking cigarettes. He has never used smokeless tobacco. He reports previous alcohol use. He reports current drug use. Drug: Methamphetamines.  Family History: History reviewed. No pertinent family history.   Objective   Physical Exam: Blood pressure (!) 132/51, pulse 96, temperature 99.6 F (37.6 C), temperature source Oral, resp. rate (!) 22, height 6\' 1"  (1.854 m), weight 75.5 kg, SpO2 99 %.  Physical Exam Vitals and nursing note reviewed.  Constitutional:      Appearance: He is ill-appearing. He is not diaphoretic.  HENT:     Head: Normocephalic.  Cardiovascular:     Rate and Rhythm: Normal rate and regular rhythm.  Pulmonary:     Effort: Tachypnea present. No respiratory distress.     Breath sounds: Examination of the left-middle field reveals rales. Examination of the right-lower field reveals rales. Examination of the left-lower  field reveals rales. Rales present. No wheezing.  Abdominal:     Comments: RLQ abdominal pain  Skin:    Comments: Superficial erythema of natal crease  Neurological:     General: No focal deficit present.  Psychiatric:        Mood and Affect: Mood normal.        Behavior: Behavior normal.     LABS on Admission: I have personally reviewed all the labs and imaging below    Basic Metabolic Panel: Recent Labs  Lab 10/08/19 2326 10/09/19 1714  NA 130* 132*  K 3.1* 4.1  CL 93* 98  CO2 23 26  GLUCOSE 177* 111*  BUN 32* 17  CREATININE 1.12 0.68  CALCIUM 8.4* 7.8*   Liver Function Tests: No results for input(s): AST, ALT, ALKPHOS, BILITOT, PROT, ALBUMIN in the last 168 hours. No results for input(s): LIPASE, AMYLASE in the last 168  hours. No results for input(s): AMMONIA in the last 168 hours. CBC: Recent Labs  Lab 10/08/19 2326 10/08/19 2326 10/09/19 1714  WBC 25.2*  --  20.2*  NEUTROABS 21.6*  --   --   HGB 10.7*  --  8.9*  HCT 32.1*  --  27.1*  MCV 94.1   < > 95.1  PLT 372  --  330   < > = values in this interval not displayed.   Cardiac Enzymes: No results for input(s): CKTOTAL, CKMB, CKMBINDEX, TROPONINI in the last 168 hours. BNP: Invalid input(s): POCBNP CBG: No results for input(s): GLUCAP in the last 168 hours.  Radiological Exams on Admission:  DG Chest Port 1 View  Result Date: 10/08/2019 CLINICAL DATA:  Worsening shortness of breath. EXAM: PORTABLE CHEST 1 VIEW COMPARISON:  Sep 25, 2019 FINDINGS: The left-sided chest tube seen on the prior study has been removed. Marked severity infiltrates are seen within the mid left lung and left lung base. A small to moderate size left pleural effusion is noted. A trace amount of pleural fluid is seen on the right. No pneumothorax is identified. The heart size and mediastinal contours are within normal limits. The visualized skeletal structures are unremarkable. IMPRESSION: 1. Marked severity infiltrates within the mid  left lung and left lung base. 2. Small to moderate size left pleural effusion. 3. Trace amount of right pleural fluid. Electronically Signed   By: Aram Candela M.D.   On: 10/08/2019 23:28      EKG: Independently reviewed.  Sinus tachycardia with nonspecific ST changes in lateral leads   A & P   Active Problems:   CAP (community acquired pneumonia)   1. Acute hypoxic respiratory failure/sepsis secondary to recurrent pneumonia and suspected recurrent left-sided empyema a. Recent Streptococcus constellatus empyema initially treated with IV antibiotics and chest tube and discharged on cefdinir on 5/29, failed outpatient regimen b. Tachycardic, tachypneic, leukocytosis with evidence of infection on recurrent chest x-ray c. Now with right-sided rales and decreased breath sounds d. Repeat chest x-ray e. Lactic acid f. IV fluid bolus to complete sepsis fluid protocol g. Broad-spectrum antibiotics with vancomycin/cefepime/metronidazole.  Discontinue vancomycin if MRSA nares negative h. Follow-up blood cultures x2 i. Incentive spirometry j. PCCM consulted, appreciate recommendations 2. RLQ abdominal pain a. Could be secondary to constipation b. Will get abdominal x-ray c. MiraLAX d. If no improvement will get CT scan 3. Elevated troponin, likely demand ischemia a. unremarkable echo at recent hospitalization b. No significant cardiac history c. Cardiology contacted by ED physician who did not recommend heparin and thought this was demand d. will check lipid panel e. Trend troponin f. Repeat EKG g. Telemetry 4. Constipation a. MiraLAX and plan as above 5. Tobacco use a. Nicotine patch 6. Hypokalemia, resolved   DVT prophylaxis: Lovenox   Code Status: Prior  Diet: Heart healthy Family Communication: Admission, patients condition and plan of care including tests being ordered have been discussed with the patient who indicates understanding and agrees with the plan and Code  Status. Patient requested to update his family on his own   Disposition Plan: The appropriate patient status for this patient is INPATIENT. Inpatient status is judged to be reasonable and necessary in order to provide the required intensity of service to ensure the patient's safety. The patient's presenting symptoms, physical exam findings, and initial radiographic and laboratory data in the context of their chronic comorbidities is felt to place them at high risk for further clinical deterioration. Furthermore, it is  not anticipated that the patient will be medically stable for discharge from the hospital within 2 midnights of admission. The following factors support the patient status of inpatient.   " The patient's presenting symptoms include worsening shortness of breath and productive cough. " The worrisome physical exam findings include decreased breath sounds and rales bilaterally and appears ill. " The initial radiographic and laboratory data are worrisome because of chest x-ray concerning for pneumonia and labs indicating sepsis. " The chronic co-morbidities include tobacco use and recent treatment for empyema.   * I certify that at the point of admission it is my clinical judgment that the patient will require inpatient hospital care spanning beyond 2 midnights from the point of admission due to high intensity of service, high risk for further deterioration and high frequency of surveillance required.*   Status is: Inpatient  Remains inpatient appropriate because:Ongoing diagnostic testing needed not appropriate for outpatient work up, IV treatments appropriate due to intensity of illness or inability to take PO and Inpatient level of care appropriate due to severity of illness   Dispo: The patient is from: Home              Anticipated d/c is to: Home              Anticipated d/c date is: > 3 days              Patient currently is not medically stable to d/c.        The medical  decision making on this patient was of high complexity and the patient is at high risk for clinical deterioration, therefore this is a level 3  admission.  Consultants  . PCCM  Procedures  . None  Time Spent on Admission: 75 minutes    Harold Hedge, DO [facility] Pager [phone] 10/09/2019, 5:53 PM

## 2019-10-09 NOTE — ED Notes (Signed)
Report provided to [facility] for transport.  ETA [id number] 

## 2019-10-09 NOTE — Progress Notes (Signed)
Pharmacy Antibiotic Note  Victor Torres is a 49 y.o. male admitted on 10/08/2019 with acute hypoxic respiratory failure/sepsis secondary to recurrent pneumonia and suspected recurrent left-sided empyema. Pharmacy has been consulted for Vancomycin and Cefepime dosing. Patient also placed on Metronidazole per MD.   Plan:  Vancomycin 1g IV q8h  Vancomycin trough level at steady state, as indicated  Cefepime 2g IV q8h  Metronidazole 500mg  IV q8h per MD  Monitor renal function, cultures, clinical course  Height: 6\' 1"  (185.4 cm) Weight: 75.5 kg (166 lb 7.2 oz) IBW/kg (Calculated) : 79.9  Temp (24hrs), Avg:98.7 F (37.1 C), Min:98.2 F (36.8 C), Max:99.6 F (37.6 C)  Recent Labs  Lab 10/08/19 2326 10/09/19 1714  WBC 25.2* 20.2*  CREATININE 1.12 0.68    Estimated Creatinine Clearance: 119.3 mL/min (by C-G formula based on SCr of 0.68 mg/dL).    No Known Allergies  Antimicrobials this admission: 6/10 Zosyn x 2 doses 6/10 Vancomycin >> 6/10 Cefepime >> 6/10 Metronidazole >>  Dose adjustments this admission: --  Microbiology results: 6/9-6/10 BCx: sent 6/10 MRSA PCR: sent  Thank you for allowing pharmacy to be a part of this patient's care.   12/09/19, PharmD, BCPS Clinical Pharmacist  10/09/2019 6:45 PM

## 2019-10-09 NOTE — ED Notes (Signed)
Inpatient bed assigned, delay for [facility] for transport. Pt made aware.

## 2019-10-10 ENCOUNTER — Inpatient Hospital Stay (HOSPITAL_COMMUNITY): Payer: 59

## 2019-10-10 DIAGNOSIS — J869 Pyothorax without fistula: Secondary | ICD-10-CM

## 2019-10-10 LAB — BASIC METABOLIC PANEL
Anion gap: 10 (ref 5–15)
BUN: 12 mg/dL (ref 6–20)
CO2: 22 mmol/L (ref 22–32)
Calcium: 7.8 mg/dL — ABNORMAL LOW (ref 8.9–10.3)
Chloride: 97 mmol/L — ABNORMAL LOW (ref 98–111)
Creatinine, Ser: 0.62 mg/dL (ref 0.61–1.24)
GFR calc Af Amer: >60 mL/min (ref >60)
GFR calc non Af Amer: >60 mL/min (ref >60)
Glucose, Bld: 127 mg/dL — ABNORMAL HIGH (ref 70–99)
Potassium: 3.2 mmol/L — ABNORMAL LOW (ref 3.5–5.1)
Sodium: 129 mmol/L — ABNORMAL LOW (ref 135–145)

## 2019-10-10 LAB — IRON AND TIBC
Iron: 22 ug/dL — ABNORMAL LOW (ref 45–182)
Saturation Ratios: 15 % — ABNORMAL LOW (ref 17.9–39.5)
TIBC: 145 ug/dL — ABNORMAL LOW (ref 250–450)
UIBC: 123 ug/dL

## 2019-10-10 LAB — LIPID PANEL
Cholesterol: 91 mg/dL (ref 0–200)
HDL: <10 mg/dL — ABNORMAL LOW (ref >40)
Triglycerides: 164 mg/dL — ABNORMAL HIGH (ref <150)
VLDL: 33 mg/dL (ref 0–40)

## 2019-10-10 LAB — CBC
HCT: 25.3 % — ABNORMAL LOW (ref 39.0–52.0)
Hemoglobin: 8 g/dL — ABNORMAL LOW (ref 13.0–17.0)
MCH: 30.3 pg (ref 26.0–34.0)
MCHC: 31.6 g/dL (ref 30.0–36.0)
MCV: 95.8 fL (ref 80.0–100.0)
Platelets: 296 10*3/uL (ref 150–400)
RBC: 2.64 MIL/uL — ABNORMAL LOW (ref 4.22–5.81)
RDW: 15.9 % — ABNORMAL HIGH (ref 11.5–15.5)
WBC: 21.3 10*3/uL — ABNORMAL HIGH (ref 4.0–10.5)
nRBC: 0 % (ref 0.0–0.2)

## 2019-10-10 LAB — OSMOLALITY, URINE: Osmolality, Ur: 744 mOsm/kg (ref 300–900)

## 2019-10-10 LAB — HEPATIC FUNCTION PANEL
ALT: 17 U/L (ref 0–44)
AST: 17 U/L (ref 15–41)
Albumin: 1.7 g/dL — ABNORMAL LOW (ref 3.5–5.0)
Alkaline Phosphatase: 94 U/L (ref 38–126)
Bilirubin, Direct: 0.3 mg/dL — ABNORMAL HIGH (ref 0.0–0.2)
Indirect Bilirubin: 0.5 mg/dL (ref 0.3–0.9)
Total Bilirubin: 0.8 mg/dL (ref 0.3–1.2)
Total Protein: 6.1 g/dL — ABNORMAL LOW (ref 6.5–8.1)

## 2019-10-10 LAB — PROCALCITONIN: Procalcitonin: 0.72 ng/mL

## 2019-10-10 LAB — PROTIME-INR
INR: 1.4 — ABNORMAL HIGH (ref 0.8–1.2)
Prothrombin Time: 16.5 seconds — ABNORMAL HIGH (ref 11.4–15.2)

## 2019-10-10 LAB — OSMOLALITY: Osmolality: 275 mOsm/kg (ref 275–295)

## 2019-10-10 LAB — FERRITIN: Ferritin: 2053 ng/mL — ABNORMAL HIGH (ref 24–336)

## 2019-10-10 LAB — SODIUM, URINE, RANDOM: Sodium, Ur: 103 mmol/L

## 2019-10-10 LAB — MRSA PCR SCREENING: MRSA by PCR: NEGATIVE

## 2019-10-10 LAB — CORTISOL-AM, BLOOD: Cortisol - AM: 18.1 ug/dL (ref 6.7–22.6)

## 2019-10-10 MED ORDER — FENTANYL CITRATE (PF) 100 MCG/2ML IJ SOLN
INTRAMUSCULAR | Status: AC
  Administered 2019-10-10: 100 ug
  Filled 2019-10-10: qty 2

## 2019-10-10 MED ORDER — MIDAZOLAM HCL 2 MG/2ML IJ SOLN
1.0000 mg | Freq: Once | INTRAMUSCULAR | Status: AC
  Administered 2019-10-10: 2 mg via INTRAVENOUS
  Filled 2019-10-10: qty 2

## 2019-10-10 MED ORDER — LIDOCAINE HCL 2 % IJ SOLN
INTRAMUSCULAR | Status: AC
  Administered 2019-10-10: 400 mg
  Filled 2019-10-10: qty 20

## 2019-10-10 MED ORDER — SODIUM CHLORIDE 0.9 % IV SOLN: INTRAVENOUS | Status: AC

## 2019-10-10 MED ORDER — FENTANYL CITRATE (PF) 100 MCG/2ML IJ SOLN
25.0000 ug | INTRAMUSCULAR | Status: DC | PRN
  Administered 2019-10-10: 100 ug via INTRAVENOUS

## 2019-10-10 MED ORDER — CHLORHEXIDINE GLUCONATE CLOTH 2 % EX PADS
6.0000 | MEDICATED_PAD | Freq: Every day | CUTANEOUS | Status: DC
  Administered 2019-10-10 – 2019-10-13 (×5): 6 via TOPICAL

## 2019-10-10 MED ORDER — SODIUM CHLORIDE 0.9 % IV BOLUS
500.0000 mL | Freq: Once | INTRAVENOUS | Status: AC
  Administered 2019-10-10: 500 mL via INTRAVENOUS

## 2019-10-10 MED ORDER — POTASSIUM CHLORIDE 10 MEQ/100ML IV SOLN
10.0000 meq | INTRAVENOUS | Status: AC
  Administered 2019-10-10 (×2): 10 meq via INTRAVENOUS
  Filled 2019-10-10 (×2): qty 100

## 2019-10-10 MED ORDER — CHLORHEXIDINE GLUCONATE CLOTH 2 % EX PADS: 6.0000 | MEDICATED_PAD | Freq: Every day | CUTANEOUS | Status: DC

## 2019-10-10 MED ORDER — MIDAZOLAM HCL 2 MG/2ML IJ SOLN
2.0000 mg | Freq: Once | INTRAMUSCULAR | Status: AC
  Administered 2019-10-10: 1 mg via INTRAVENOUS

## 2019-10-10 MED ORDER — SODIUM CHLORIDE 0.9% FLUSH
10.0000 mL | Freq: Three times a day (TID) | INTRAVENOUS | Status: DC
  Administered 2019-10-10 – 2019-10-12 (×2): 10 mL
  Administered 2019-10-12: 30 mL
  Administered 2019-10-13 – 2019-10-14 (×4): 10 mL

## 2019-10-10 NOTE — Progress Notes (Signed)
Gave report to [facility] in ICU

## 2019-10-10 NOTE — Progress Notes (Signed)
eLink Physician-Brief Progress Note Patient Name: CAMPBELL KRAY DOB: 11/25/1970 MRN: [id number]   Date of Service  10/10/2019  HPI/Events of Note  Bedside RN concerned about patients' chest tube function.  eICU Interventions  Portable CXR f/u pneumothorax and r/o empyema.        Thomasene Lot Labrian Torregrossa 10/10/2019, 11:41 PM

## 2019-10-10 NOTE — Progress Notes (Addendum)
PROGRESS NOTE    Victor Torres    Code Status: Full Code  ZOX:[id number] DOB: 03/20/1971 DOA: 10/08/2019 LOS: 1 days  PCP: Patient, No Pcp Per CC:  Chief Complaint  Patient presents with  . Shortness of Breath       Hospital Summary   This is a 49 year old male with past medical history of tobacco use, alcohol abuse, recent admission from 5/23-5/29 for cute hypoxic respiratory failure and sepsis secondary to left sided empyema and pneumonia found to be positive for Streptococcus constellatus at which time he had a chest tube placed and was treated with IV antibiotics and discharged on p.o. antibiotics however has since had worsening symptoms.  He was supposed to follow-up in the office on 6/14 but due to his worsening symptoms once of breath and subjective fevers with productive cough and contacted his PCP who recommended patient be seen either by urgent care or ED and [facility].  He had a chest x-ray which showed marked severity infiltrates within the mid left lung and base and small to moderate-sized left pleural effusion with trace amount of right pleural fluid.  Additionally at [facility] patient had noted elevated troponin from 23->106 and cardiology was consulted by ED physician who did not recommend starting on heparin and thought this was due to demand.  [facility].  CT chest without contrast showed recurrent left-sided empyema with worsening left upper and left lower lobe consolidation as well as small bilateral pleural effusions and mediastinal and hilar adenopathy presumably reactive.  He was admitted and started on broad-spectrum antibiotics with vancomycin/metronidazole/cefepime and PCCM was consulted.  6/11: Transfer to stepdown unit for closer observation and chest tube placed by PCCM with 750 cc purulent material drained  A & P   Active Problems:   CAP (community acquired pneumonia)  1. Acute hypoxic respiratory  failure  Sepsis secondary to recurrent left empyema and concern for new evolving pulmonary abscess s/p chest tube placement on 6/11 a. Recent Streptococcus constellatus empyema, failed outpatient cefdinir b. Received IV fluid bolus to complete sepsis fluid protocol c. procalcitonin 0.72 d. Day 2 vancomycin/cefepime/metronidazole e. Blood cultures with no growth to date x2 f. Chest tube placed today, 750 cc purulent fluid drained, Follow-up cultures g. Incentive spirometry h. PCCM on board  2. RLQ abdominal pain, resolved a. Could be secondary to constipation b. abdominal x-ray with hepatomegaly otherwise normal bowel gas pattern c. MiraLAX d. If no improvement will get CT scan  3. Elevated troponin, likely demand ischemia a. Troponin 23->106->62 b. unremarkable echo at recent hospitalization c. No significant cardiac history d. Cardiology contacted by ED physician who did not recommend heparin and thought this was demand e. will check lipid panel f. Telemetry  4. Constipation a. MiraLAX and plan as above  5. Tobacco use a. Nicotine patch  6. Hypokalemia, resolved  7. Hepatomegaly 1. Check LFTs  8. Anemia of chronic disease 1. Hb trending down from 10.7, currently 8.0 2. Hold off on transfusion for now  9. Hyponatremia 1. Check sodium studies  10. Hypokalemia 1. Replete  11. Superficial Sacral pressure ulcer, present on admission 1. Very light erythema noted on picture in physical exam that was obtained yesterday during admission 2. Wound care    DVT prophylaxis: lovenox Family Communication: Patient wished to update family on his own Disposition Plan:  Status is: Inpatient  Remains inpatient appropriate because:IV treatments appropriate due to intensity of illness or inability  to take PO and Inpatient level of care appropriate due to severity of illness   Dispo: The patient is from: Home              Anticipated d/c is to: TBD              Anticipated d/c  date is: > 3 days              Patient currently is not medically stable to d/c.          Pressure injury documentation    None  Consultants  PCCM  Procedures  6/11: Chest tube  Antibiotics   Anti-infectives (From admission, onward)   Start     Dose/Rate Route Frequency Ordered Stop   10/10/19 0200  vancomycin (VANCOCIN) IVPB 1000 mg/200 mL premix     Discontinue     1,000 mg 200 mL/hr over 60 Minutes Intravenous Every 8 hours 10/09/19 1834     10/09/19 2200  metroNIDAZOLE (FLAGYL) IVPB 500 mg     Discontinue     500 mg 100 mL/hr over 60 Minutes Intravenous Every 8 hours 10/09/19 1706     10/09/19 2200  ceFEPIme (MAXIPIME) 2 g in sodium chloride 0.9 % 100 mL IVPB     Discontinue     2 g 200 mL/hr over 30 Minutes Intravenous Every 8 hours 10/09/19 1833     10/09/19 1745  vancomycin (VANCOCIN) IVPB 1000 mg/200 mL premix        1,000 mg 200 mL/hr over 60 Minutes Intravenous STAT 10/09/19 1740 10/09/19 1926   10/09/19 1600  piperacillin-tazobactam (ZOSYN) IVPB 3.375 g        3.375 g 100 mL/hr over 30 Minutes Intravenous  Once 10/09/19 1546 10/09/19 1617   10/09/19 0000  vancomycin (VANCOCIN) IVPB 1000 mg/200 mL premix        1,000 mg 200 mL/hr over 60 Minutes Intravenous  Once 10/08/19 2350 10/09/19 0144   10/09/19 0000  piperacillin-tazobactam (ZOSYN) IVPB 3.375 g        3.375 g 100 mL/hr over 30 Minutes Intravenous  Once 10/08/19 2350 10/09/19 0041        Subjective   Transferred to SDU this morning by pulm for closer monitoring. Seen in SDU after chest tube insertion, stated that he feels better now post the procedure. Reports his abdominal pain resolved. No other issues, no other complaints  Objective   Vitals:   10/10/19 0433 10/10/19 0801 10/10/19 0846 10/10/19 1203  BP: (!) 118/47 (!) 112/47 (!) 119/48 (!) 141/50  Pulse: 80 81 80 88  Resp: 16 18 (!) 28 (!) 25  Temp: (!) 97.4 F (36.3 C) 97.9 F (36.6 C) 98.6 F (37 C)   TempSrc:  Oral Oral   SpO2:  98% 100% 98% 100%  Weight:      Height:        Intake/Output Summary (Last 24 hours) at 10/10/2019 1301 Last data filed at 10/10/2019 0641 Gross per 24 hour  Intake 648.6 ml  Output 625 ml  Net 23.6 ml   Filed Weights   10/09/19 1738  Weight: 75.5 kg    Examination:  Physical Exam Vitals and nursing note reviewed.  Constitutional:      Appearance: He is ill-appearing.     Interventions: He is not intubated. HENT:     Head: Normocephalic.  Cardiovascular:     Rate and Rhythm: Regular rhythm. Tachycardia present.  Pulmonary:     Effort: No respiratory distress.  He is not intubated.     Comments: Decreased left sided breath sounds Musculoskeletal:     Right lower leg: No tenderness. No edema.     Left lower leg: No tenderness. No edema.  Neurological:     General: No focal deficit present.     Mental Status: He is alert.  Psychiatric:        Mood and Affect: Mood normal.        Behavior: Behavior normal.       Data Reviewed: I have personally reviewed following labs and imaging studies  CBC: Recent Labs  Lab 10/08/19 2326 10/09/19 1714 10/10/19 0350  WBC 25.2* 20.2* 21.3*  NEUTROABS 21.6*  --   --   HGB 10.7* 8.9* 8.0*  HCT 32.1* 27.1* 25.3*  MCV 94.1 95.1 95.8  PLT 372 330 296   Basic Metabolic Panel: Recent Labs  Lab 10/08/19 2326 10/09/19 1714 10/10/19 0350  NA 130* 132* 129*  K 3.1* 4.1 3.2*  CL 93* 98 97*  CO2 23 26 22   GLUCOSE 177* 111* 127*  BUN 32* 17 12  CREATININE 1.12 0.68 0.62  CALCIUM 8.4* 7.8* 7.8*   GFR: Estimated Creatinine Clearance: 119.3 mL/min (by C-G formula based on SCr of 0.62 mg/dL). Liver Function Tests: Recent Labs  Lab 10/10/19 0814  AST 17  ALT 17  ALKPHOS 94  BILITOT 0.8  PROT 6.1*  ALBUMIN 1.7*   No results for input(s): LIPASE, AMYLASE in the last 168 hours. No results for input(s): AMMONIA in the last 168 hours. Coagulation Profile: Recent Labs  Lab 10/10/19 0350  INR 1.4*   Cardiac Enzymes: No  results for input(s): CKTOTAL, CKMB, CKMBINDEX, TROPONINI in the last 168 hours. BNP (last 3 results) No results for input(s): PROBNP in the last 8760 hours. HbA1C: No results for input(s): HGBA1C in the last 72 hours. CBG: No results for input(s): GLUCAP in the last 168 hours. Lipid Profile: No results for input(s): CHOL, HDL, LDLCALC, TRIG, CHOLHDL, LDLDIRECT in the last 72 hours. Thyroid Function Tests: No results for input(s): TSH, T4TOTAL, FREET4, T3FREE, THYROIDAB in the last 72 hours. Anemia Panel: Recent Labs    10/10/19 0814  FERRITIN 2,053*  TIBC 145*  IRON 22*   Sepsis Labs: Recent Labs  Lab 10/10/19 0350  PROCALCITON 0.72    Recent Results (from the past 240 hour(s))  Blood culture (routine x 2)     Status: None (Preliminary result)   Collection Time: 10/08/19 11:56 PM   Specimen: BLOOD  Result Value Ref Range Status   Specimen Description   Final    BLOOD RIGHT ANTECUBITAL Performed at [facility], [address] [place], [place]    Special Requests   Final    BOTTLES DRAWN AEROBIC AND ANAEROBIC Blood Culture adequate volume Performed at [facility], [address] [place], [place]    Culture   Final    NO GROWTH < 24 HOURS Performed at [facility], [address], [place], [place]    Report Status PENDING  Incomplete  SARS Coronavirus 2 by RT PCR (hospital order, performed in [facility] hospital lab) Nasopharyngeal Nasopharyngeal Swab     Status: None   Collection Time: 10/09/19 12:08 AM   Specimen: Nasopharyngeal Swab  Result Value Ref Range Status   SARS Coronavirus 2 NEGATIVE NEGATIVE Final    Comment: (NOTE) SARS-CoV-2 target nucleic acids are NOT DETECTED.  The SARS-CoV-2 RNA  is generally detectable in upper and lower respiratory specimens during the acute phase of infection. The lowest concentration of SARS-CoV-2 viral copies this assay can detect is 250 copies / mL. A negative  result does not preclude SARS-CoV-2 infection and should not be used as the sole basis for treatment or other patient management decisions.  A negative result may occur with improper specimen collection / handling, submission of specimen other than nasopharyngeal swab, presence of viral mutation(s) within the areas targeted by this assay, and inadequate number of viral copies (<250 copies / mL). A negative result must be combined with clinical observations, patient history, and epidemiological information.  Fact Sheet for Patients:   BoilerBrush.com.cy  Fact Sheet for Healthcare Providers: https://pope.com/  This test is not yet approved or  cleared by the [place] FDA and has been authorized for detection and/or diagnosis of SARS-CoV-2 by FDA under an Emergency Use Authorization (EUA).  This EUA will remain in effect (meaning this test can be used) for the duration of the COVID-19 declaration under Section 564(b)(1) of the Act, 21 U.S.C. section 360bbb-3(b)(1), unless the authorization is terminated or revoked sooner.  Performed at [facility], [address] [place], [place] [address]   Blood culture (routine x 2)     Status: None (Preliminary result)   Collection Time: 10/09/19 12:10 AM   Specimen: BLOOD  Result Value Ref Range Status   Specimen Description   Final    BLOOD LEFT ANTECUBITAL Performed at [facility], [address] [place], [place] [address]    Special Requests   Final    BOTTLES DRAWN AEROBIC AND ANAEROBIC Blood Culture adequate volume Performed at [facility], [address] [place], [place] [address]    Culture   Final    NO GROWTH < 24 HOURS Performed at [facility], [address], [place], [place] [address]    Report Status PENDING  Incomplete  MRSA PCR Screening     Status: None   Collection Time: 10/09/19  5:06 PM   Specimen: Nasal Mucosa;  Nasopharyngeal  Result Value Ref Range Status   MRSA by PCR NEGATIVE NEGATIVE Final    Comment:        The GeneXpert MRSA Assay (FDA approved for NASAL specimens only), is one component of a comprehensive MRSA colonization surveillance program. It is not intended to diagnose MRSA infection nor to guide or monitor treatment for MRSA infections. Performed at [facility], [address], [place], [place] [address]          Radiology Studies: DG Abd 1 View  Result Date: 10/09/2019 CLINICAL DATA:  Abdominal pain and distention. EXAM: ABDOMEN - 1 VIEW COMPARISON:  None. FINDINGS: There appears to be a large left-sided pleural effusion. The liver is enlarged measuring approximately 23 cm craniocaudad. The bowel gas pattern is nonobstructive. IMPRESSION: 1. Nonobstructive bowel gas pattern. 2. Probable large left-sided pleural effusion. 3. Hepatomegaly. Electronically Signed   By: Katherine Mantle M.D.   On: 10/09/2019 20:23   CT CHEST WO CONTRAST  Result Date: 10/09/2019 CLINICAL DATA:  Empyema EXAM: CT CHEST WITHOUT CONTRAST TECHNIQUE: Multidetector CT imaging of the chest was performed following the standard protocol without IV contrast. COMPARISON:  CT angio chest dated Sep 21, 2019 FINDINGS: Cardiovascular: Heart size is normal. Coronary artery calcifications are noted. There is no significant pericardial effusion. There is no evidence for thoracic aortic aneurysm. There are atherosclerotic changes  of the thoracic aorta. The intracardiac blood pool is hypodense relative to the adjacent myocardium consistent with anemia. Mediastinum/Nodes: --there are enlarged mediastinal lymph nodes. --there are enlarged hilar lymph nodes. -- No axillary lymphadenopathy. -- No supraclavicular lymphadenopathy. -- Normal thyroid gland where visualized. -  Unremarkable esophagus. Lungs/Pleura: There is extensive consolidation in the left upper lobe which has significantly progressed since  the prior study. There is consolidation versus atelectasis at the left lung base, not well evaluated in the absence of IV contrast. Emphysematous changes are noted. There has been development of a thick walled air and fluid collection in the left hemithorax measuring approximately 14.8 x 7.3 cm. Between the chest wall and this fluid collection there does not appear to be normal intervening lung, however evaluation is limited by lack of IV contrast. There are small bilateral pleural effusions. Upper Abdomen: Contrast bolus timing is not optimized for evaluation of the abdominal organs. The visualized portions of the organs of the upper abdomen are normal. Musculoskeletal: No chest wall abnormality. No bony spinal canal stenosis. IMPRESSION: 1. Recurrent left-sided empyema as detailed above. 2. Worsening consolidation in the left upper and left lower lobes. 3. Small bilateral pleural effusions. 4. Mediastinal and hilar adenopathy, presumably reactive. Aortic Atherosclerosis (ICD10-I70.0) and Emphysema (ICD10-J43.9). These results will be called to the ordering clinician or representative by the Radiologist Assistant, and communication documented in the PACS or [employer]. Electronically Signed   By: Katherine Mantle M.D.   On: 10/09/2019 20:30   DG CHEST PORT 1 VIEW  Result Date: 10/09/2019 CLINICAL DATA:  Decreased right breath sounds, prior abnormal chest x-ray EXAM: PORTABLE CHEST 1 VIEW COMPARISON:  10/08/2019 FINDINGS: Single frontal view of the chest demonstrates persistent left lung consolidation and effusion. Right chest is clear. No pneumothorax. Cardiac silhouette is stable. No acute bony abnormalities. IMPRESSION: 1. Stable left lung consolidation and pleural effusion, compatible with pneumonia. 2. Right chest is clear. No explanation for decreased breath sounds on physical exam. Electronically Signed   By: Sharlet Salina M.D.   On: 10/09/2019 19:33   DG Chest Port 1 View  Result Date:  10/08/2019 CLINICAL DATA:  Worsening shortness of breath. EXAM: PORTABLE CHEST 1 VIEW COMPARISON:  Sep 25, 2019 FINDINGS: The left-sided chest tube seen on the prior study has been removed. Marked severity infiltrates are seen within the mid left lung and left lung base. A small to moderate size left pleural effusion is noted. A trace amount of pleural fluid is seen on the right. No pneumothorax is identified. The heart size and mediastinal contours are within normal limits. The visualized skeletal structures are unremarkable. IMPRESSION: 1. Marked severity infiltrates within the mid left lung and left lung base. 2. Small to moderate size left pleural effusion. 3. Trace amount of right pleural fluid. Electronically Signed   By: Aram Candela M.D.   On: 10/08/2019 23:28        Scheduled Meds: . Chlorhexidine Gluconate Cloth  6 each Topical Daily  . enoxaparin (LOVENOX) injection  40 mg Subcutaneous Q24H  . folic acid  1 mg Oral Daily  . nicotine  21 mg Transdermal Daily  . pantoprazole  40 mg Oral Daily  . polyethylene glycol  17 g Oral Daily  . sodium chloride flush  10 mL Intracatheter Q8H  . thiamine  100 mg Oral Daily   Continuous Infusions: . sodium chloride 100 mL/hr at 10/10/19 0235  . ceFEPime (MAXIPIME) IV 2 g (10/10/19 0850)  . metronidazole 500 mg (  10/10/19 0511)  . vancomycin 1,000 mg (10/10/19 1011)     Time spent: 35 minutes with over 50% of the time coordinating the patient's care    Jae Dire, DO [facility] Pager [phone]  Call night coverage person covering after 7pm

## 2019-10-10 NOTE — Progress Notes (Addendum)
Patient was in the yellow mews upon arrival to shift. Rechecked VS at 0846.   Temp 98.6 BP 119/48 MAP 80 HR 88 RR 28 O2 98 % [place] 3L.  Notified Viviana Simpler, RN that patient was still in the Yellow MEWS. Gave patient 1 MG of Morphine for his pain. Patient alert and oriented x4 and has zero signs of distress. Patient transferred to [facility] ICU per MD orders.

## 2019-10-10 NOTE — Progress Notes (Addendum)
   NAME:  Victor Torres, MRN:  [id number], DOB:  May 06, 1970, LOS: 1 ADMISSION DATE:  10/08/2019, CONSULTATION DATE: June 10 REFERRING MD: Henrene Hawking, CHIEF COMPLAINT: Dyspnea  Brief History   49 year old male recently treated for empyema returned on June 10 complaining of dyspnea with a recurrent left-sided pleural effusion   Past Medical History  Alcohol abuse Tobacco abuse Recent empyema  Significant Hospital Events   May 24 PCCM consult, intubation, chest tube placement> chest tube removed on May 29 June 10 admitted  Consults:  PCCM  Procedures:    Significant Diagnostic Tests:  6/10 CT Chest >>>>There is extensive consolidation in the left upper lobe which has significantly progressed since the prior study.There has been development of a thick walled air and fluid collection in the left hemithorax measuring approximately 14.8 x 7.3 cm. Between the chest wall and this fluid collection there does not appear to be normal intervening lung,  Micro Data:  5/23 Sars-Cov-2>>negative 5/23 MRSA screen-negative 5/23 Urine culture>> 5/23 Blood culture>> 5/23 Body fluid culture (empyema)-ABUNDANT STREPTOCOCCUS CONSTELLATUS   June 10 SARS-CoV-2> negative June 10 blood culture>   Antimicrobials:  5/23 vanc>  5/23 flagyl >  5/23 cefepime >   Interim history/subjective:   General: Complains of chest pressure, now having weeping putrid smelling pleural fluid draining from old chest tube site Objective   Blood pressure (Abnormal) 119/48, pulse 80, temperature 98.6 F (37 C), temperature source Oral, resp. rate (Abnormal) 28, height 6\' 1"  (1.854 m), weight 75.5 kg, SpO2 98 %.        Intake/Output Summary (Last 24 hours) at 10/10/2019 0852 Last data filed at 10/10/2019 0641 Gross per 24 hour  Intake 888.6 ml  Output 1225 ml  Net -336.4 ml   Filed Weights   10/09/19 1738  Weight: 75.5 kg    Examination: General this 49 year old white male resting in bed currently in no acute  distress HEENT normocephalic atraumatic no jugular venous distention Pulmonary rhonchorous on the left.  Also diminished left base.  Dressing with putrid smelling drainage Cardiac regular rate and rhythm Abdomen soft nontender Extremities are warm and dry Neuro intact   Resolved Hospital Problem list     Assessment & Plan:  Acute respiratory failure with hypoxemia in the setting of most likely recurrent empyema, pneumonia and now also pulmonary abscess CT chest w/ large loculated collection of fluid w/ air in it. Worsening LUL consolidation  Plan Cont current abx (day 2 cefepime flagyl and vanc) Supplemental oxygen  I have reviewed the imaging with Dr. 54, it appears as though he has both empyema in addition to what appears to be new evolving pulmonary abscess.  We will move him to the intensive care for chest tube later this morning He will need a prolonged course of antibiotics I spoke to ID who agree w/ current broad spec abx; await f/u cultures but also feel that drainage was key to improvement  We may need to repeat CT scan in couple days depending on CXR change and out-put from CT   Best practice:   Per [facility]   Kendrick Fries ACNP-BC [facility] Pulmonary/Critical Care Pager # [phone] OR # [phone] if no answer

## 2019-10-10 NOTE — Procedures (Addendum)
Chest Tube Insertion Procedure Note  Indications:  Clinically significant Empyema  Pre-operative Diagnosis: Empyema  Post-operative Diagnosis: Pneumothorax and Empyema  Procedure Details  Informed consent was obtained for the procedure, including sedation.  Risks of lung perforation, hemorrhage, arrhythmia, and adverse drug reaction were discussed.   After sterile skin prep, using standard technique, a 28 French tube was placed in the left lateral 8th rib space.  -attempted small bore initially. Massive volumes of puss shooting out from old CT site every time he would cough or increase thoracic force. The guide wire was bent between Coughing efforts and I was unable to reliably get fluid from FNA. At that point I elected to do traditional chest tube insertion. Upon opening pleural space massive amounts of purulent fluid again shot out of his pleural space.   Findings: ~750  ml of purulent fluid obtained  Estimated Blood Loss:  less than 50 mL         Specimens:  None              Complications:  None; patient tolerated the procedure well.         Disposition: ICU - extubated and stable.         Condition: stable  Simonne Martinet ACNP-BC [facility] Pulmonary/Critical Care Pager # [phone] OR # [phone] if no answer

## 2019-10-10 NOTE — Progress Notes (Signed)
Positive culture results of abundant WBC and few gram positive cocci in pleural fluid shared with CCMD on call.  No orders received at this time. Will continue to monitor.

## 2019-10-11 ENCOUNTER — Inpatient Hospital Stay (HOSPITAL_COMMUNITY): Payer: 59

## 2019-10-11 LAB — BASIC METABOLIC PANEL
Anion gap: 7 (ref 5–15)
BUN: 8 mg/dL (ref 6–20)
CO2: 26 mmol/L (ref 22–32)
Calcium: 7.7 mg/dL — ABNORMAL LOW (ref 8.9–10.3)
Chloride: 96 mmol/L — ABNORMAL LOW (ref 98–111)
Creatinine, Ser: 0.53 mg/dL — ABNORMAL LOW (ref 0.61–1.24)
GFR calc Af Amer: >60 mL/min (ref >60)
GFR calc non Af Amer: >60 mL/min (ref >60)
Glucose, Bld: 127 mg/dL — ABNORMAL HIGH (ref 70–99)
Potassium: 3.4 mmol/L — ABNORMAL LOW (ref 3.5–5.1)
Sodium: 129 mmol/L — ABNORMAL LOW (ref 135–145)

## 2019-10-11 LAB — CBC
HCT: 25.5 % — ABNORMAL LOW (ref 39.0–52.0)
Hemoglobin: 8.2 g/dL — ABNORMAL LOW (ref 13.0–17.0)
MCH: 30.1 pg (ref 26.0–34.0)
MCHC: 32.2 g/dL (ref 30.0–36.0)
MCV: 93.8 fL (ref 80.0–100.0)
Platelets: 334 10*3/uL (ref 150–400)
RBC: 2.72 MIL/uL — ABNORMAL LOW (ref 4.22–5.81)
RDW: 15.9 % — ABNORMAL HIGH (ref 11.5–15.5)
WBC: 18.1 10*3/uL — ABNORMAL HIGH (ref 4.0–10.5)
nRBC: 0 % (ref 0.0–0.2)

## 2019-10-11 MED ORDER — HYDROMORPHONE HCL 1 MG/ML IJ SOLN
0.5000 mg | INTRAMUSCULAR | Status: DC | PRN
  Administered 2019-10-11 – 2019-10-12 (×8): 0.5 mg via INTRAVENOUS
  Filled 2019-10-11 (×8): qty 1

## 2019-10-11 MED ORDER — POTASSIUM CHLORIDE CRYS ER 20 MEQ PO TBCR
40.0000 meq | EXTENDED_RELEASE_TABLET | Freq: Once | ORAL | Status: AC
  Administered 2019-10-11: 40 meq via ORAL
  Filled 2019-10-11: qty 2

## 2019-10-11 MED ORDER — PNEUMOCOCCAL VAC POLYVALENT 25 MCG/0.5ML IJ INJ
0.5000 mL | INJECTION | INTRAMUSCULAR | Status: DC
  Filled 2019-10-11: qty 0.5

## 2019-10-11 MED ORDER — KETOROLAC TROMETHAMINE 15 MG/ML IJ SOLN
15.0000 mg | Freq: Once | INTRAMUSCULAR | Status: AC
  Administered 2019-10-11: 15 mg via INTRAVENOUS
  Filled 2019-10-11: qty 1

## 2019-10-11 NOTE — Progress Notes (Signed)
   NAME:  Victor Torres, MRN:  [id number], DOB:  February 16, 1971, LOS: 2 ADMISSION DATE:  10/08/2019, CONSULTATION DATE: June 10 REFERRING MD: Henrene Hawking, CHIEF COMPLAINT: Dyspnea  Brief History   49 year old male recently treated for empyema returned on June 10 complaining of dyspnea with a recurrent left-sided pleural effusion   Past Medical History  Alcohol abuse Tobacco abuse Recent empyema  Significant Hospital Events   May 24 PCCM consult, intubation, chest tube placement> chest tube removed on May 29 June 10 admitted  Consults:  PCCM  Procedures:    Significant Diagnostic Tests:  6/10 CT Chest >>>>There is extensive consolidation in the left upper lobe which has significantly progressed since the prior study.There has been development of a thick walled air and fluid collection in the left hemithorax measuring approximately 14.8 x 7.3 cm. Between the chest wall and this fluid collection there does not appear to be normal intervening lung,  Micro Data:  5/23 Sars-Cov-2>>negative 5/23 MRSA screen-negative 5/23 Urine culture>> 5/23 Blood culture>> 5/23 Body fluid culture (empyema)-ABUNDANT STREPTOCOCCUS CONSTELLATUS   June 10 SARS-CoV-2> negative June 10 blood culture>   June 11 left pleural fluid > GPC  Antimicrobials:  5/23 vanc>  5/23 flagyl >  5/23 cefepime >   Interim history/subjective:   Pressure/pain at chest tube site  Objective   Blood pressure (!) 140/44, pulse 83, temperature 97.8 F (36.6 C), temperature source Oral, resp. rate (!) 25, height 6\' 1"  (1.854 m), weight 75.5 kg, SpO2 92 %.        Intake/Output Summary (Last 24 hours) at 10/11/2019 12/11/2019 Last data filed at 10/11/2019 0739 Gross per 24 hour  Intake 2141.5 ml  Output 1760 ml  Net 381.5 ml   Filed Weights   10/09/19 1738  Weight: 75.5 kg    Examination: General:  Resting comfortably in bed HENT: NCAT OP clear PULM: Rhonchi, crackles left lung B, normal effort CV: RRR, no mgr GI: BS+,  soft, nontender MSK: normal bulk and tone Neuro: awake, alert, no distress, MAEW  6/12 CXR > left chest tube, effusion  Resolved Hospital Problem list     Assessment & Plan:  Acute respiratory failure with hypoxemia in the setting of most likely recurrent empyema, pneumonia and now also pulmonary abscess, broncho-cutaneous fistula CT chest w/ large loculated collection of fluid w/ air in it. Worsening LUL consolidation  Plan Continue current antibiotics for now F/u left pleural fluid Continue chest tube to suction Out of bed, sit in chair Suspect that he will need 3-4 weeks of antibiotics May need PICC Will likely repeat CT chset on 6/12   Best practice:   Per [facility]   8/12, MD [facility] Pager: [phone] Cell: [phone] If no response, call [phone]

## 2019-10-11 NOTE — Progress Notes (Signed)
eLink Physician-Brief Progress Note Patient Name: Victor Torres DOB: Jan 19, 1971 MRN: [id number]   Date of Service  10/11/2019  HPI/Events of Note  K+ 3.4  eICU Interventions  KCL 40 meq po x 1        Augustine Brannick U Vanisha Whiten 10/11/2019, 5:01 AM

## 2019-10-11 NOTE — Progress Notes (Signed)
PROGRESS NOTE    Victor Torres    Code Status: Full Code  CBJ:[id number] DOB: 1970-09-24 DOA: 10/08/2019 LOS: 2 days  PCP: Patient, No Pcp Per CC:  Chief Complaint  Patient presents with  . Shortness of Breath       Hospital Summary   This is a 49 year old male with past medical history of tobacco use, alcohol abuse, recent admission from 5/23-5/29 for cute hypoxic respiratory failure and sepsis secondary to left sided empyema and pneumonia found to be positive for Streptococcus constellatus at which time he had a chest tube placed and was treated with IV antibiotics and discharged on p.o. antibiotics however has since had worsening symptoms.  He was supposed to follow-up in the office on 6/14 but due to his worsening symptoms once of breath and subjective fevers with productive cough and contacted his PCP who recommended patient be seen either by urgent care or ED and he presented to [facility] yesterday morning.  He had a chest x-ray which showed marked severity infiltrates within the mid left lung and base and small to moderate-sized left pleural effusion with trace amount of right pleural fluid.  Additionally at [facility] patient had noted elevated troponin from 23->106 and cardiology was consulted by ED physician who did not recommend starting on heparin and thought this was due to demand.  Patient was transferred to [facility] for further management.  CT chest without contrast showed recurrent left-sided empyema with worsening left upper and left lower lobe consolidation as well as small bilateral pleural effusions and mediastinal and hilar adenopathy presumably reactive.  He was admitted and started on broad-spectrum antibiotics with vancomycin/metronidazole/cefepime and PCCM was consulted.  6/11: Transfer to stepdown unit for closer observation and chest tube placed by PCCM with 750 cc purulent material drained  A & P   Active Problems:   CAP (community acquired pneumonia)  1. 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 Recent Streptococcus constellatus empyema, failed outpatient cefdinir b. Day 3 vancomycin/cefepime/metronidazole c. Blood cultures with no growth to date x2 d. Chest tube to suction, 750 cc purulent fluid drained, Follow-up cultures with Gram Positive Cocci e. Incentive spirometry f. CT chest tomorrow g. PCCM on board  2. Left chest/axilla pain a. Concern for broncho cutaneous fistula, discussed with PCCM today b. May have had some subcutaneous abscess drainage/tracking underneath the skin c. CT scan tomorrow per PCCM d. [place] was ordered to evaluate  e. Change morphine for dilaudid  f. Toradol x 1  3. RLQ abdominal pain, resolved  4. Elevated troponin, likely demand ischemia a. Troponin 23->106->62 b. unremarkable echo at recent hospitalization c. No significant cardiac history d. Cardiology contacted by ED physician who did not recommend heparin and thought this was demand e. Lipid panel with very low HDL <10, not calculated LDL f. Telemetry  5. Constipation a. MiraLAX and plan as above  6. Tobacco use a. Nicotine patch  7. Hypokalemia, resolved  8. Hepatomegaly, probably from alcohol 1. LFTs unremarkable  9. Anemia of chronic disease 1. stable 2. Hold off on transfusion for now  10. Hyponatremia 1. Check sodium studies  11. Hypokalemia 1. Replete  12. Superficial Sacral pressure ulcer, present on admission 1. Very light erythema noted on picture in physical exam that was obtained yesterday during admission 2. Wound care    DVT prophylaxis: lovenox Family Communication: Patient wished to  update family on his own Disposition Plan:  Status is: Inpatient  Remains inpatient appropriate because:IV treatments appropriate due to intensity of illness or inability to take PO and Inpatient level  of care appropriate due to severity of illness   Dispo: The patient is from: Home              Anticipated d/c is to: TBD              Anticipated d/c date is: > 3 days              Patient currently is not medically stable to d/c.          Pressure injury documentation    None  Consultants  PCCM  Procedures  6/11: Chest tube  Antibiotics   Anti-infectives (From admission, onward)   Start     Dose/Rate Route Frequency Ordered Stop   10/10/19 0200  vancomycin (VANCOCIN) IVPB 1000 mg/200 mL premix     Discontinue     1,000 mg 200 mL/hr over 60 Minutes Intravenous Every 8 hours 10/09/19 1834     10/09/19 2200  metroNIDAZOLE (FLAGYL) IVPB 500 mg     Discontinue     500 mg 100 mL/hr over 60 Minutes Intravenous Every 8 hours 10/09/19 1706     10/09/19 2200  ceFEPIme (MAXIPIME) 2 g in sodium chloride 0.9 % 100 mL IVPB     Discontinue     2 g 200 mL/hr over 30 Minutes Intravenous Every 8 hours 10/09/19 1833     10/09/19 1745  vancomycin (VANCOCIN) IVPB 1000 mg/200 mL premix        1,000 mg 200 mL/hr over 60 Minutes Intravenous STAT 10/09/19 1740 10/09/19 1926   10/09/19 1600  piperacillin-tazobactam (ZOSYN) IVPB 3.375 g        3.375 g 100 mL/hr over 30 Minutes Intravenous  Once 10/09/19 1546 10/09/19 1617   10/09/19 0000  vancomycin (VANCOCIN) IVPB 1000 mg/200 mL premix        1,000 mg 200 mL/hr over 60 Minutes Intravenous  Once 10/08/19 2350 10/09/19 0144   10/09/19 0000  piperacillin-tazobactam (ZOSYN) IVPB 3.375 g        3.375 g 100 mL/hr over 30 Minutes Intravenous  Once 10/08/19 2350 10/09/19 0041        Subjective   Severe left chest wall/axillary pain radiating down flank, most notable when he moves and started within the past 24 hours. Concern overnight for chest tube malfunction and CXR was ordered which showed resolution of basilar pneumothorax on left but otherwise no change.  Other than these issues, he states his breathing and overall no other  issues.  Objective   Vitals:   10/11/19 0700 10/11/19 0800 10/11/19 0900 10/11/19 1000  BP: (!) 140/44 (!) 143/41 (!) 134/46 (!) 126/39  Pulse: 83 80 86 85  Resp: (!) 25 (!) 25 (!) 21 (!) 23  Temp:  99.1 F (37.3 C)    TempSrc:  Oral    SpO2: 92% 97% 92% 99%  Weight:      Height:        Intake/Output Summary (Last 24 hours) at 10/11/2019 1044 Last data filed at 10/11/2019 1040 Gross per 24 hour  Intake 2341.5 ml  Output 1760 ml  Net 581.5 ml   Filed Weights   10/09/19 1738  Weight: 75.5 kg    Examination:  Physical Exam Vitals and nursing note reviewed.  Constitutional:      Appearance: Normal appearance.  HENT:     Head: Normocephalic and atraumatic.  Eyes:     Conjunctiva/sclera: Conjunctivae normal.  Cardiovascular:     Rate and Rhythm: Normal rate and regular rhythm.  Chest:     Chest wall: Tenderness present.     Comments: Left chest wall with some swelling and tenderness to palpation along the axillary line near chest tube insertion site Abdominal:     General: Abdomen is flat.     Palpations: Abdomen is soft.  Musculoskeletal:        General: No swelling or tenderness.     Comments: Significant pain on left chest with lifting left arm  Skin:    Coloration: Skin is not jaundiced or pale.  Neurological:     Mental Status: He is alert. Mental status is at baseline.  Psychiatric:        Mood and Affect: Mood normal.        Behavior: Behavior normal.       Data Reviewed: I have personally reviewed following labs and imaging studies  CBC: Recent Labs  Lab 10/08/19 2326 10/09/19 1714 10/10/19 0350 10/11/19 0155  WBC 25.2* 20.2* 21.3* 18.1*  NEUTROABS 21.6*  --   --   --   HGB 10.7* 8.9* 8.0* 8.2*  HCT 32.1* 27.1* 25.3* 25.5*  MCV 94.1 95.1 95.8 93.8  PLT 372 330 296 334   Basic Metabolic Panel: Recent Labs  Lab 10/08/19 2326 10/09/19 1714 10/10/19 0350 10/11/19 0155  NA 130* 132* 129* 129*  K 3.1* 4.1 3.2* 3.4*  CL 93* 98 97* 96*   CO2 23 26 22 26   GLUCOSE 177* 111* 127* 127*  BUN 32* 17 12 8   CREATININE 1.12 0.68 0.62 0.53*  CALCIUM 8.4* 7.8* 7.8* 7.7*   GFR: Estimated Creatinine Clearance: 119.3 mL/min (A) (by C-G formula based on SCr of 0.53 mg/dL (L)). Liver Function Tests: Recent Labs  Lab 10/10/19 0814  AST 17  ALT 17  ALKPHOS 94  BILITOT 0.8  PROT 6.1*  ALBUMIN 1.7*   No results for input(s): LIPASE, AMYLASE in the last 168 hours. No results for input(s): AMMONIA in the last 168 hours. Coagulation Profile: Recent Labs  Lab 10/10/19 0350  INR 1.4*   Cardiac Enzymes: No results for input(s): CKTOTAL, CKMB, CKMBINDEX, TROPONINI in the last 168 hours. BNP (last 3 results) No results for input(s): PROBNP in the last 8760 hours. HbA1C: No results for input(s): HGBA1C in the last 72 hours. CBG: No results for input(s): GLUCAP in the last 168 hours. Lipid Profile: Recent Labs    10/10/19 0800  CHOL 91  HDL <10*  LDLCALC NOT CALCULATED  TRIG 164*  CHOLHDL NOT CALCULATED   Thyroid Function Tests: No results for input(s): TSH, T4TOTAL, FREET4, T3FREE, THYROIDAB in the last 72 hours. Anemia Panel: Recent Labs    10/10/19 0814  FERRITIN 2,053*  TIBC 145*  IRON 22*   Sepsis Labs: Recent Labs  Lab 10/10/19 0350  PROCALCITON 0.72    Recent Results (from the past 240 hour(s))  Blood culture (routine x 2)     Status: None (Preliminary result)   Collection Time: 10/08/19 11:56 PM   Specimen: BLOOD  Result Value Ref Range Status   Specimen Description   Final    BLOOD RIGHT ANTECUBITAL Performed at [facility], [address] [place], [address] [place]    Special Requests   Final    BOTTLES DRAWN AEROBIC AND ANAEROBIC Blood Culture adequate volume Performed  at [facility] [place], [address] [place], [place] [address]    Culture   Final    NO GROWTH 2 DAYS Performed at [facility], [address] [place], [place] [address]    Report Status PENDING   Incomplete  SARS Coronavirus 2 by RT PCR (hospital order, performed in [facility] hospital lab) Nasopharyngeal Nasopharyngeal Swab     Status: None   Collection Time: 10/09/19 12:08 AM   Specimen: Nasopharyngeal Swab  Result Value Ref Range Status   SARS Coronavirus 2 NEGATIVE NEGATIVE Final    Comment: (NOTE) SARS-CoV-2 target nucleic acids are NOT DETECTED.  The SARS-CoV-2 RNA is generally detectable in upper and lower respiratory specimens during the acute phase of infection. The lowest concentration of SARS-CoV-2 viral copies this assay can detect is 250 copies / mL. A negative result does not preclude SARS-CoV-2 infection and should not be used as the sole basis for treatment or other patient management decisions.  A negative result may occur with improper specimen collection / handling, submission of specimen other than nasopharyngeal swab, presence of viral mutation(s) within the areas targeted by this assay, and inadequate number of viral copies (<250 copies / mL). A negative result must be combined with clinical observations, patient history, and epidemiological information.  Fact Sheet for Patients:   BoilerBrush.com.cyhttps://www.fda.gov/media/136312/download  Fact Sheet for Healthcare Providers: https://pope.com/https://www.fda.gov/media/136313/download  This test is not yet approved or  cleared by the [place] FDA and has been authorized for detection and/or diagnosis of SARS-CoV-2 by FDA under an Emergency Use Authorization (EUA).  This EUA will remain in effect (meaning this test can be used) for the duration of the COVID-19 declaration under Section 564(b)(1) of the Act, 21 U.S.C. section 360bbb-3(b)(1), unless the authorization is terminated or revoked sooner.  Performed at [facility] [place], [address] [place], [place] [address]   Blood culture (routine x 2)     Status: None (Preliminary result)   Collection Time: 10/09/19 12:10 AM   Specimen: BLOOD  Result Value Ref Range  Status   Specimen Description   Final    BLOOD LEFT ANTECUBITAL Performed at [facility] [place], [address] [place], [place] [address]    Special Requests   Final    BOTTLES DRAWN AEROBIC AND ANAEROBIC Blood Culture adequate volume Performed at [facility] [place], [address] [place], [place] [address]    Culture   Final    NO GROWTH 2 DAYS Performed at [facility], [address] [place], [place] [address]    Report Status PENDING  Incomplete  MRSA PCR Screening     Status: None   Collection Time: 10/09/19  5:06 PM   Specimen: Nasal Mucosa; Nasopharyngeal  Result Value Ref Range Status   MRSA by PCR NEGATIVE NEGATIVE Final    Comment:        The GeneXpert MRSA Assay (FDA approved for NASAL specimens only), is one component of a comprehensive MRSA colonization surveillance program. It is not intended to diagnose MRSA infection nor to guide or monitor treatment for MRSA infections. Performed at [facility], [address] [place], [place] [address]   MRSA PCR Screening     Status: None   Collection Time: 10/10/19  3:08 PM   Specimen: Nasal Mucosa; Nasopharyngeal  Result Value Ref Range Status   MRSA by PCR NEGATIVE NEGATIVE Final    Comment:  The GeneXpert MRSA Assay (FDA approved for NASAL specimens only), is one component of a comprehensive MRSA colonization surveillance program. It is not intended to diagnose MRSA infection nor to guide or monitor treatment for MRSA infections. Performed at [facility], [place] [address], [place], [place] [address]   Body fluid culture     Status: None (Preliminary result)   Collection Time: 10/10/19  6:47 PM   Specimen: Pleura; Body Fluid  Result Value Ref Range Status   Specimen Description   Final    PLEURAL Performed at [place] [address] [place], [place] [address]    Special Requests   Final    NONE Performed at [facility], [place] [address] [place], [place] [address]    Gram Stain   Final    ABUNDANT WBC PRESENT, PREDOMINANTLY PMN FEW GRAM POSITIVE COCCI CRITICAL RESULT CALLED TO, READ BACK BY AND VERIFIED WITH: Tyson Babinski RN 10/10/19 2352 JDW    Culture   Final    NO GROWTH < 12 HOURS Performed at [place] [address], [place], [place] [address]    Report Status PENDING  Incomplete         Radiology Studies: DG Abd 1 View  Result Date: 10/09/2019 CLINICAL DATA:  Abdominal pain and distention. EXAM: ABDOMEN - 1 VIEW COMPARISON:  None. FINDINGS: There appears to be a large left-sided pleural effusion. The liver is enlarged measuring approximately 23 cm craniocaudad. The bowel gas pattern is nonobstructive. IMPRESSION: 1. Nonobstructive bowel gas pattern. 2. Probable large left-sided pleural effusion. 3. Hepatomegaly. Electronically Signed   By: Constance Holster M.D.   On: 10/09/2019 20:23   CT CHEST WO CONTRAST  Result Date: 10/09/2019 CLINICAL DATA:  Empyema EXAM: CT CHEST WITHOUT CONTRAST TECHNIQUE: Multidetector CT imaging of the chest was performed following the standard protocol without IV contrast. COMPARISON:  CT angio chest dated Sep 21, 2019 FINDINGS: Cardiovascular: Heart size is normal. Coronary artery calcifications are noted. There is no significant pericardial effusion. There is no evidence for thoracic aortic aneurysm. There are atherosclerotic changes of the thoracic aorta. The intracardiac blood pool is hypodense relative to the adjacent myocardium consistent with anemia. Mediastinum/Nodes: --there are enlarged mediastinal lymph nodes. --there are enlarged hilar lymph nodes. -- No axillary lymphadenopathy. -- No supraclavicular lymphadenopathy. -- Normal thyroid gland where visualized. -  Unremarkable esophagus. Lungs/Pleura: There is extensive consolidation in the left upper lobe which has significantly progressed since the prior study. There is  consolidation versus atelectasis at the left lung base, not well evaluated in the absence of IV contrast. Emphysematous changes are noted. There has been development of a thick walled air and fluid collection in the left hemithorax measuring approximately 14.8 x 7.3 cm. Between the chest wall and this fluid collection there does not appear to be normal intervening lung, however evaluation is limited by lack of IV contrast. There are small bilateral pleural effusions. Upper Abdomen: Contrast bolus timing is not optimized for evaluation of the abdominal organs. The visualized portions of the organs of the upper abdomen are normal. Musculoskeletal: No chest wall abnormality. No bony spinal canal stenosis. IMPRESSION: 1. Recurrent left-sided empyema as detailed above. 2. Worsening consolidation in the left upper and left lower lobes. 3. Small bilateral pleural effusions. 4. Mediastinal and hilar adenopathy, presumably reactive. Aortic Atherosclerosis (ICD10-I70.0) and Emphysema (ICD10-J43.9). These results will be called to the ordering clinician or representative by the Radiologist Assistant, and communication documented  in the PACS or [employer]. Electronically Signed   By: Katherine Mantle M.D.   On: 10/09/2019 20:30   DG Chest Port 1 View  Result Date: 10/11/2019 CLINICAL DATA:  Pneumothorax EXAM: PORTABLE CHEST 1 VIEW COMPARISON:  October 10, 2019 FINDINGS: The left-sided chest tube is stable in positioning. There is a persistent opacity overlying the left lower lung zone. The left basilar pneumothorax is not well appreciated on this study. There is no definite left-sided pneumothorax. There is subcutaneous gas along the patient's left flank. The right lung field is stable in appearance. The heart size is stable in appearance. There is a persistent area of consolidation in the left upper lobe. IMPRESSION: Resolution of the basilar pneumothorax on the left. Otherwise, no significant interval change.  Electronically Signed   By: Katherine Mantle M.D.   On: 10/11/2019 00:55   DG Chest Port 1 View  Result Date: 10/10/2019 CLINICAL DATA:  Status post chest tube placement EXAM: PORTABLE CHEST 1 VIEW COMPARISON:  October 09, 2019 FINDINGS: A chest tube is been placed on the left with pleural effusion on the left slightly smaller. There is subcutaneous air on the left with questionable minimal pneumothorax in the inferolateral left base. There remains extensive airspace consolidation throughout much of the left lung, most notably in the mid and lower lung zones. There is an ill-defined area of airspace opacity in the right upper lobe which is new from 1 day prior. Right lung otherwise clear. Heart is borderline enlarged with pulmonary vascularity normal. No adenopathy. No bone lesions. IMPRESSION: Chest tube on the left inferiorly with subcutaneous air and questionable tiny left inferolateral pneumothorax. Left pleural effusion smaller. Fairly extensive consolidation throughout the left mid and lower lung zone regions remains with a lesser degree of consolidation in the left upper lobe. Few small area of infiltrate in the right upper lobe. Stable cardiac silhouette. Electronically Signed   By: Bretta Bang III M.D.   On: 10/10/2019 14:19   DG CHEST PORT 1 VIEW  Result Date: 10/09/2019 CLINICAL DATA:  Decreased right breath sounds, prior abnormal chest x-ray EXAM: PORTABLE CHEST 1 VIEW COMPARISON:  10/08/2019 FINDINGS: Single frontal view of the chest demonstrates persistent left lung consolidation and effusion. Right chest is clear. No pneumothorax. Cardiac silhouette is stable. No acute bony abnormalities. IMPRESSION: 1. Stable left lung consolidation and pleural effusion, compatible with pneumonia. 2. Right chest is clear. No explanation for decreased breath sounds on physical exam. Electronically Signed   By: Sharlet Salina M.D.   On: 10/09/2019 19:33        Scheduled Meds: . Chlorhexidine  Gluconate Cloth  6 each Topical Daily  . enoxaparin (LOVENOX) injection  40 mg Subcutaneous Q24H  . folic acid  1 mg Oral Daily  . nicotine  21 mg Transdermal Daily  . pantoprazole  40 mg Oral Daily  . polyethylene glycol  17 g Oral Daily  . sodium chloride flush  10 mL Intracatheter Q8H  . thiamine  100 mg Oral Daily   Continuous Infusions: . ceFEPime (MAXIPIME) IV Stopped (10/11/19 0631)  . metronidazole Stopped (10/11/19 7829)  . vancomycin Stopped (10/11/19 1038)     Time spent:37 minutes with over 50% of the time coordinating the patient's care    Jae Dire, DO [facility] Pager [phone]  Call night coverage person covering after 7pm

## 2019-10-12 ENCOUNTER — Inpatient Hospital Stay (HOSPITAL_COMMUNITY): Payer: 59

## 2019-10-12 LAB — BASIC METABOLIC PANEL
Anion gap: 7 (ref 5–15)
BUN: 11 mg/dL (ref 6–20)
CO2: 27 mmol/L (ref 22–32)
Calcium: 7.7 mg/dL — ABNORMAL LOW (ref 8.9–10.3)
Chloride: 97 mmol/L — ABNORMAL LOW (ref 98–111)
Creatinine, Ser: 0.63 mg/dL (ref 0.61–1.24)
GFR calc Af Amer: >60 mL/min (ref >60)
GFR calc non Af Amer: >60 mL/min (ref >60)
Glucose, Bld: 145 mg/dL — ABNORMAL HIGH (ref 70–99)
Potassium: 3.5 mmol/L (ref 3.5–5.1)
Sodium: 131 mmol/L — ABNORMAL LOW (ref 135–145)

## 2019-10-12 LAB — CBC
HCT: 24.9 % — ABNORMAL LOW (ref 39.0–52.0)
Hemoglobin: 8.2 g/dL — ABNORMAL LOW (ref 13.0–17.0)
MCH: 30.7 pg (ref 26.0–34.0)
MCHC: 32.9 g/dL (ref 30.0–36.0)
MCV: 93.3 fL (ref 80.0–100.0)
Platelets: 376 10*3/uL (ref 150–400)
RBC: 2.67 MIL/uL — ABNORMAL LOW (ref 4.22–5.81)
RDW: 15.9 % — ABNORMAL HIGH (ref 11.5–15.5)
WBC: 18.7 10*3/uL — ABNORMAL HIGH (ref 4.0–10.5)
nRBC: 0 % (ref 0.0–0.2)

## 2019-10-12 MED ORDER — SODIUM CHLORIDE 0.9 % IV SOLN: INTRAVENOUS | Status: DC | PRN

## 2019-10-12 MED ORDER — HYDROMORPHONE HCL 1 MG/ML IJ SOLN
1.0000 mg | INTRAMUSCULAR | Status: DC | PRN
  Administered 2019-10-12 – 2019-10-15 (×21): 1 mg via INTRAVENOUS
  Filled 2019-10-12 (×21): qty 1

## 2019-10-12 NOTE — Progress Notes (Signed)
Pharmacy Antibiotic Note  Victor Torres is a 49 y.o. male admitted on 10/08/2019 with acute hypoxic respiratory failure/sepsis secondary to recurrent pneumonia and suspected recurrent left-sided empyema. Pharmacy has been consulted for Vancomycin and Cefepime dosing. Patient also placed on Metronidazole per MD.   10/12/2019: Day 4 abx Afebrile WBC remains elevated Scr stable, +UOP Pleural fluid cx IP- gram stain + GPC  Plan:  Continue Vancomycin 1g IV q8h  Check Vancomycin trough tomorrow  Continue Cefepime 2g IV q8h  Continue Metronidazole 500mg  IV q8h per MD  Monitor renal function, cultures, clinical course  Height: 6\' 1"  (185.4 cm) Weight: 75.5 kg (166 lb 7.2 oz) IBW/kg (Calculated) : 79.9  Temp (24hrs), Avg:98.4 F (36.9 C), Min:97.9 F (36.6 C), Max:98.9 F (37.2 C)  Recent Labs  Lab 10/08/19 2326 10/09/19 1714 10/10/19 0350 10/11/19 0155 10/12/19 0225  WBC 25.2* 20.2* 21.3* 18.1* 18.7*  CREATININE 1.12 0.68 0.62 0.53* 0.63    Estimated Creatinine Clearance: 119.3 mL/min (by C-G formula based on SCr of 0.63 mg/dL).    No Known Allergies  Antimicrobials this admission: 6/10 Zosyn x 2 doses 6/10 Vancomycin >> 6/10 Cefepime >> 6/10 Metronidazole >>  Dose adjustments this admission: --  Microbiology results: 6/9-6/10 BCx: NGTD 6/10 MRSA PCR: negative 6/11 Pleural fluid: NGTD (gram stain- few GPC)  Thank you for allowing pharmacy to be a part of this patient's care.  07-11-1996, PharmD, BCPS Clinical Pharmacist  10/12/2019 11:52 AM

## 2019-10-12 NOTE — Progress Notes (Signed)
PROGRESS NOTE    Victor Torres    Code Status: Full Code  OEU:[id number] DOB: 04-Sep-1970 DOA: 10/08/2019 LOS: 3 days  PCP: Patient, No Pcp Per CC:  Chief Complaint  Patient presents with  . Shortness of Breath       Hospital Summary   This is a 49 year old male with past medical history of tobacco use, alcohol abuse, recent admission from 5/23-5/29 for cute hypoxic respiratory failure and sepsis secondary to left sided empyema and pneumonia found to be positive for Streptococcus constellatus at which time he had a chest tube placed and was treated with IV antibiotics and discharged on p.o. antibiotics however has since had worsening symptoms.  He was supposed to follow-up in the office on 6/14 but due to his worsening symptoms once of breath and subjective fevers with productive cough and contacted his PCP who recommended patient be seen either by urgent care or ED and he presented to [facility] yesterday morning.  He had a chest x-ray which showed marked severity infiltrates within the mid left lung and base and small to moderate-sized left pleural effusion with trace amount of right pleural fluid.  Additionally at [facility] patient had noted elevated troponin from 23->106 and cardiology was consulted by ED physician who did not recommend starting on heparin and thought this was due to demand.  Patient was transferred to [facility] for further management.  CT chest without contrast showed recurrent left-sided empyema with development of a thick walled air and fluid collection in the left hemithorax measuring approximately 14.8 x 7.3 cm. Between the chest wall and this fluid collection there does not appear to be normal intervening lung as well as small bilateral pleural effusions and mediastinal and hilar adenopathy presumably reactive.  He was admitted and started on broad-spectrum antibiotics with vancomycin/metronidazole/cefepime and PCCM was consulted.  6/11: Transfer to stepdown unit for closer observation and  chest tube placed by PCCM with 750 cc purulent material drained immediately  A & P   Active Problems:   CAP (community acquired pneumonia)  1. 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 Recent Streptococcus constellatus empyema, failed outpatient cefdinir b. Day 4 vancomycin/cefepime/metronidazole c. Blood cultures with no growth to date x2 d. Chest tube to suction, 750 cc purulent fluid drained, Follow-up cultures with Gram Positive Cocci e. Incentive spirometry f. CT chest today g. PCCM on board  2. Left chest/axilla pain secondary to chest tube and infection above a. [place] with subcutaneous edema without fluid collection b. Continue current pain regimen  3. RLQ abdominal pain, resolved  4. Elevated troponin, likely demand ischemia a. Troponin 23->106->62 b. unremarkable echo at recent hospitalization c. No significant cardiac history d. Cardiology contacted by ED physician who did not recommend heparin and thought this was demand e. Lipid panel with very low HDL <10, not calculated LDL f. Telemetry  5. Constipation a. MiraLAX and plan as above  6. Tobacco use a. Nicotine patch  7. Hypokalemia, resolved  8. Hepatomegaly, probably from alcohol 1. LFTs unremarkable  9. Anemia of chronic disease 1. stable 2. Hold off on transfusion for now  10. Hyponatremia 1. 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 Hypokalemia resolved  12. Superficial Sacral pressure ulcer, present on admission 1. Wound care    DVT prophylaxis: lovenox Family Communication: Patient wished to update family on his own Disposition Plan:  Status is: Inpatient  Remains inpatient appropriate because:IV treatments appropriate due to intensity of illness or inability to take PO and Inpatient level of care  appropriate due to severity of illness   Dispo: The patient is from: Home              Anticipated d/c is to: TBD              Anticipated d/c date is: > 3 days              Patient currently is not medically stable to d/c.          Pressure injury documentation    None  Consultants  PCCM  Procedures  6/11: Chest tube  Antibiotics   Anti-infectives (From admission, onward)   Start     Dose/Rate Route Frequency Ordered Stop   10/10/19 0200  vancomycin (VANCOCIN) IVPB 1000 mg/200 mL premix     Discontinue     1,000 mg 200 mL/hr over 60 Minutes Intravenous Every 8 hours 10/09/19 1834     10/09/19 2200  metroNIDAZOLE (FLAGYL) IVPB 500 mg     Discontinue     500 mg 100 mL/hr over 60 Minutes Intravenous Every 8 hours 10/09/19 1706     10/09/19 2200  ceFEPIme (MAXIPIME) 2 g in sodium chloride 0.9 % 100 mL IVPB     Discontinue     2 g 200 mL/hr over 30 Minutes Intravenous Every 8 hours 10/09/19 1833     10/09/19 1745  vancomycin (VANCOCIN) IVPB 1000 mg/200 mL premix        1,000 mg 200 mL/hr over 60 Minutes Intravenous STAT 10/09/19 1740 10/09/19 1926   10/09/19 1600  piperacillin-tazobactam (ZOSYN) IVPB 3.375 g        3.375 g 100 mL/hr over 30 Minutes Intravenous  Once 10/09/19 1546 10/09/19 1617   10/09/19 0000  vancomycin (VANCOCIN) IVPB 1000 mg/200 mL premix        1,000 mg 200 mL/hr over 60 Minutes Intravenous  Once 10/08/19 2350 10/09/19 0144   10/09/19 0000  piperacillin-tazobactam (ZOSYN) IVPB 3.375 g        3.375 g 100 mL/hr over 30 Minutes Intravenous  Once 10/08/19 2350 10/09/19 0041        Subjective   No new issues overnight or new complaints. Patient is stable and making jokes today. Still with some discomfort similar to yesterday but with improved breathing overall.   Objective   Vitals:   10/12/19 0700 10/12/19 0800 10/12/19 0900 10/12/19 1000  BP: (!) 136/31 (!) 122/26 (!) 116/51 (!) 158/48  Pulse: 79 87 77 78  Resp: (!) 22 (!) 22 (!) 22 (!)  30  Temp: 97.9 F (36.6 C)     TempSrc: Oral     SpO2: 92% 91% 93% 95%  Weight:      Height:        Intake/Output Summary (Last 24 hours) at 10/12/2019 1131 Last data filed at 10/12/2019 1047 Gross per 24 hour  Intake 1204.48 ml  Output 1670 ml  Net -465.52 ml   Filed Weights   10/09/19 1738  Weight: 75.5 kg    Examination:  Physical Exam Vitals and nursing note reviewed.  Constitutional:      Appearance: Normal appearance.  HENT:     Head: Normocephalic and atraumatic.  Eyes:     Conjunctiva/sclera: Conjunctivae normal.  Cardiovascular:     Rate and Rhythm: Normal rate and regular rhythm.  Pulmonary:  Comments: Some conversational dyspnea Left rales Chest:     Comments: Left chest with dressing overlying chest tube insertion Abdominal:     General: Abdomen is flat.     Palpations: Abdomen is soft.  Musculoskeletal:        General: No swelling or tenderness.  Skin:    Coloration: Skin is not jaundiced or pale.  Neurological:     Mental Status: He is alert. Mental status is at baseline.  Psychiatric:        Mood and Affect: Mood normal.        Behavior: Behavior normal.        Data Reviewed: I have personally reviewed following labs and imaging studies  CBC: Recent Labs  Lab 10/08/19 2326 10/09/19 1714 10/10/19 0350 10/11/19 0155 10/12/19 0225  WBC 25.2* 20.2* 21.3* 18.1* 18.7*  NEUTROABS 21.6*  --   --   --   --   HGB 10.7* 8.9* 8.0* 8.2* 8.2*  HCT 32.1* 27.1* 25.3* 25.5* 24.9*  MCV 94.1 95.1 95.8 93.8 93.3  PLT 372 330 296 334 376   Basic Metabolic Panel: Recent Labs  Lab 10/08/19 2326 10/09/19 1714 10/10/19 0350 10/11/19 0155 10/12/19 0225  NA 130* 132* 129* 129* 131*  K 3.1* 4.1 3.2* 3.4* 3.5  CL 93* 98 97* 96* 97*  CO2 23 26 22 26 27   GLUCOSE 177* 111* 127* 127* 145*  BUN 32* 17 12 8 11   CREATININE 1.12 0.68 0.62 0.53* 0.63  CALCIUM 8.4* 7.8* 7.8* 7.7* 7.7*   GFR: Estimated Creatinine Clearance: 119.3 mL/min (by C-G  formula based on SCr of 0.63 mg/dL). Liver Function Tests: Recent Labs  Lab 10/10/19 0814  AST 17  ALT 17  ALKPHOS 94  BILITOT 0.8  PROT 6.1*  ALBUMIN 1.7*   No results for input(s): LIPASE, AMYLASE in the last 168 hours. No results for input(s): AMMONIA in the last 168 hours. Coagulation Profile: Recent Labs  Lab 10/10/19 0350  INR 1.4*   Cardiac Enzymes: No results for input(s): CKTOTAL, CKMB, CKMBINDEX, TROPONINI in the last 168 hours. BNP (last 3 results) No results for input(s): PROBNP in the last 8760 hours. HbA1C: No results for input(s): HGBA1C in the last 72 hours. CBG: No results for input(s): GLUCAP in the last 168 hours. Lipid Profile: Recent Labs    10/10/19 0800  CHOL 91  HDL <10*  LDLCALC NOT CALCULATED  TRIG 164*  CHOLHDL NOT CALCULATED   Thyroid Function Tests: No results for input(s): TSH, T4TOTAL, FREET4, T3FREE, THYROIDAB in the last 72 hours. Anemia Panel: Recent Labs    10/10/19 0814  FERRITIN 2,053*  TIBC 145*  IRON 22*   Sepsis Labs: Recent Labs  Lab 10/10/19 0350  PROCALCITON 0.72    Recent Results (from the past 240 hour(s))  Blood culture (routine x 2)     Status: None (Preliminary result)   Collection Time: 10/08/19 11:56 PM   Specimen: BLOOD  Result Value Ref Range Status   Specimen Description   Final    BLOOD RIGHT ANTECUBITAL Performed at [facility] [place], [address] [place], [place] [address]    Special Requests   Final    BOTTLES DRAWN AEROBIC AND ANAEROBIC Blood Culture adequate volume Performed at [facility] [place], [address] [place], [place] [address]    Culture   Final    NO GROWTH 3 DAYS Performed at [facility], [address] [place], [place] [address]  Report Status PENDING  Incomplete  SARS Coronavirus 2 by RT PCR (hospital order, performed in [facility] hospital lab) Nasopharyngeal Nasopharyngeal Swab     Status: None   Collection Time: 10/09/19 12:08 AM    Specimen: Nasopharyngeal Swab  Result Value Ref Range Status   SARS Coronavirus 2 NEGATIVE NEGATIVE Final    Comment: (NOTE) SARS-CoV-2 target nucleic acids are NOT DETECTED.  The SARS-CoV-2 RNA is generally detectable in upper and lower respiratory specimens during the acute phase of infection. The lowest concentration of SARS-CoV-2 viral copies this assay can detect is 250 copies / mL. A negative result does not preclude SARS-CoV-2 infection and should not be used as the sole basis for treatment or other patient management decisions.  A negative result may occur with improper specimen collection / handling, submission of specimen other than nasopharyngeal swab, presence of viral mutation(s) within the areas targeted by this assay, and inadequate number of viral copies (<250 copies / mL). A negative result must be combined with clinical observations, patient history, and epidemiological information.  Fact Sheet for Patients:   BoilerBrush.com.cy  Fact Sheet for Healthcare Providers: https://pope.com/  This test is not yet approved or  cleared by the [place] FDA and has been authorized for detection and/or diagnosis of SARS-CoV-2 by FDA under an Emergency Use Authorization (EUA).  This EUA will remain in effect (meaning this test can be used) for the duration of the COVID-19 declaration under Section 564(b)(1) of the Act, 21 U.S.C. section 360bbb-3(b)(1), unless the authorization is terminated or revoked sooner.  Performed at [facility], [address] [place], [place] [address]   Blood culture (routine x 2)     Status: None (Preliminary result)   Collection Time: 10/09/19 12:10 AM   Specimen: BLOOD  Result Value Ref Range Status   Specimen Description   Final    BLOOD LEFT ANTECUBITAL Performed at [facility], [address] [place], [place] [address]    Special Requests   Final    BOTTLES  DRAWN AEROBIC AND ANAEROBIC Blood Culture adequate volume Performed at [facility], [address] [place], [place] [address]    Culture   Final    NO GROWTH 3 DAYS Performed at [facility], [address] [place], [place] [address]    Report Status PENDING  Incomplete  MRSA PCR Screening     Status: None   Collection Time: 10/09/19  5:06 PM   Specimen: Nasal Mucosa; Nasopharyngeal  Result Value Ref Range Status   MRSA by PCR NEGATIVE NEGATIVE Final    Comment:        The GeneXpert MRSA Assay (FDA approved for NASAL specimens only), is one component of a comprehensive MRSA colonization surveillance program. It is not intended to diagnose MRSA infection nor to guide or monitor treatment for MRSA infections. Performed at [facility], [address] [place], [place] [address]   MRSA PCR Screening     Status: None   Collection Time: 10/10/19  3:08 PM   Specimen: Nasal Mucosa; Nasopharyngeal  Result Value Ref Range Status   MRSA by PCR NEGATIVE NEGATIVE Final    Comment:        The GeneXpert MRSA Assay (FDA approved for NASAL specimens only), is one component of a comprehensive MRSA colonization surveillance program. It is not intended to diagnose MRSA infection nor to guide or monitor treatment for MRSA infections. Performed at [facility], [address] [place], [place] [address]   Body fluid culture     Status: None (Preliminary result)   Collection Time: 10/10/19  6:47 PM   Specimen: Pleura; Body Fluid  Result Value Ref Range Status   Specimen Description   Final    PLEURAL Performed at [facility], [address] [place], [place] [address]    Special Requests   Final    NONE Performed at [facility], [address], [place], [place] [address]    Gram Stain   Final    ABUNDANT WBC PRESENT, PREDOMINANTLY PMN FEW GRAM POSITIVE COCCI CRITICAL RESULT CALLED TO,  READ BACK BY AND VERIFIED WITH: Katrinka Blazing RN 10/10/19 2352 JDW    Culture   Final    NO GROWTH 2 DAYS Performed at [facility], [address] [place], [place] [address]    Report Status PENDING  Incomplete         Radiology Studies: DG CHEST PORT 1 VIEW  Result Date: 10/12/2019 CLINICAL DATA:  Empyema of the lung. EXAM: PORTABLE CHEST 1 VIEW COMPARISON:  10/11/2019; 10/10/2018; 10/09/2019; chest CT-10/09/2019 FINDINGS: Grossly unchanged cardiac silhouette and mediastinal contours with partial obscuration of left heart border secondary to grossly unchanged heterogeneous/consolidative opacity involving the peripheral and basilar aspect of the left mid/lower lung. Stable position of support apparatus. No pneumothorax. Trace left-sided pleural effusion is not excluded. The right lung remains well aerated. No acute osseous abnormalities. Slight reduction in the amount of left lateral chest wall subcutaneous emphysema. IMPRESSION: 1.  Stable positioning of support apparatus.  No pneumothorax. 2. Similar appearance of heterogeneous/consolidative opacity involving the mid and lower aspect of the left lung compatible provided history of empyema. Electronically Signed   By: Simonne Come M.D.   On: 10/12/2019 04:29   DG Chest Port 1 View  Result Date: 10/11/2019 CLINICAL DATA:  Pneumothorax EXAM: PORTABLE CHEST 1 VIEW COMPARISON:  October 10, 2019 FINDINGS: The left-sided chest tube is stable in positioning. There is a persistent opacity overlying the left lower lung zone. The left basilar pneumothorax is not well appreciated on this study. There is no definite left-sided pneumothorax. There is subcutaneous gas along the patient's left flank. The right lung field is stable in appearance. The heart size is stable in appearance. There is a persistent area of consolidation in the left upper lobe. IMPRESSION: Resolution of the basilar pneumothorax on the left. Otherwise, no significant interval change.  Electronically Signed   By: Katherine Mantle M.D.   On: 10/11/2019 00:55   DG Chest Port 1 View  Result Date: 10/10/2019 CLINICAL DATA:  Status post chest tube placement EXAM: PORTABLE CHEST 1 VIEW COMPARISON:  October 09, 2019 FINDINGS: A chest tube is been placed on the left with pleural effusion on the left slightly smaller. There is subcutaneous air on the left with questionable minimal pneumothorax in the inferolateral left base. There remains extensive airspace consolidation throughout much of the left lung, most notably in the mid and lower lung zones. There is an ill-defined area of airspace opacity in the right upper lobe which is new from 1 day prior. Right lung otherwise clear. Heart is borderline enlarged with pulmonary vascularity normal. No adenopathy. No bone lesions. IMPRESSION: Chest tube on the left inferiorly with subcutaneous air and questionable tiny left inferolateral pneumothorax. Left pleural effusion smaller. Fairly extensive consolidation throughout the left mid and lower lung zone regions remains with a lesser degree  of consolidation in the left upper lobe. Few small area of infiltrate in the right upper lobe. Stable cardiac silhouette. Electronically Signed   By: Bretta Bang III M.D.   On: 10/10/2019 14:19   [place] CHEST SOFT TISSUE  Result Date: 10/11/2019 CLINICAL DATA:  Inpatient. Pain and swelling in the left chest wall superior to the chest tube site. EXAM: ULTRASOUND OF HEAD/NECK SOFT TISSUES TECHNIQUE: Ultrasound examination of the head and neck soft tissues was performed in the area of clinical concern. COMPARISON:  Chest radiograph from earlier today. FINDINGS: Mild skin thickening and subcutaneous edema demonstrated surrounding the left chest tube site, with no fluid collection or mass. IMPRESSION: Mild skin thickening and subcutaneous edema demonstrated surrounding the left chest tube site, with no fluid collection or mass. Electronically Signed   By: Delbert Phenix M.D.    On: 10/11/2019 11:45        Scheduled Meds: . Chlorhexidine Gluconate Cloth  6 each Topical Daily  . enoxaparin (LOVENOX) injection  40 mg Subcutaneous Q24H  . folic acid  1 mg Oral Daily  . nicotine  21 mg Transdermal Daily  . pantoprazole  40 mg Oral Daily  . pneumococcal 23 valent vaccine  0.5 mL Intramuscular Tomorrow-1000  . polyethylene glycol  17 g Oral Daily  . sodium chloride flush  10 mL Intracatheter Q8H  . thiamine  100 mg Oral Daily   Continuous Infusions: . ceFEPime (MAXIPIME) IV Stopped (10/12/19 0557)  . metronidazole Stopped (10/12/19 1610)  . vancomycin Stopped (10/12/19 1046)     Time spent:28 minutes with over 50% of the time coordinating the patient's care    Jae Dire, DO [facility] Pager [phone]  Call night coverage person covering after 7pm

## 2019-10-12 NOTE — Progress Notes (Signed)
° °  NAME:  Victor Torres, MRN:  [id number], DOB:  29-Mar-1971, LOS: 3 ADMISSION DATE:  10/08/2019, CONSULTATION DATE: June 10 REFERRING MD: Henrene Hawking, CHIEF COMPLAINT: Dyspnea  Brief History   49 year old male recently treated for empyema returned on June 10 complaining of dyspnea with a recurrent left-sided pleural effusion   Past Medical History  Alcohol abuse Tobacco abuse Recent empyema  Significant Hospital Events   May 24 PCCM consult, intubation, chest tube placement> chest tube removed on May 29 June 10 admitted  Consults:  PCCM  Procedures:    Significant Diagnostic Tests:  6/10 CT Chest >>>>There is extensive consolidation in the left upper lobe which has significantly progressed since the prior study.There has been development of a thick walled air and fluid collection in the left hemithorax measuring approximately 14.8 x 7.3 cm. Between the chest wall and this fluid collection there does not appear to be normal intervening lung,  6/12 Ultrasound left chest wall: generalized thickening of skin, no fluid collection in skin  Micro Data:  5/23 Sars-Cov-2>>negative 5/23 MRSA screen-negative 5/23 Urine culture>> 5/23 Blood culture>> 5/23 Body fluid culture (empyema)-ABUNDANT STREPTOCOCCUS CONSTELLATUS   June 10 SARS-CoV-2> negative June 10 blood culture>   June 11 left pleural fluid > GPC  Antimicrobials:  5/23 vanc>  5/23 flagyl >  5/23 cefepime >   Interim history/subjective:   WBC down Wants to get out of bed Pain improved  Objective   Blood pressure (!) 136/31, pulse 79, temperature 97.9 F (36.6 C), temperature source Oral, resp. rate (!) 22, height 6\' 1"  (1.854 m), weight 75.5 kg, SpO2 92 %.        Intake/Output Summary (Last 24 hours) at 10/12/2019 0733 Last data filed at 10/12/2019 10/14/2019 Gross per 24 hour  Intake 1217.01 ml  Output 2345 ml  Net -1127.99 ml   Filed Weights   10/09/19 1738  Weight: 75.5 kg    Examination:  General:  Resting  comfortably in bed HENT: NCAT OP clear PULM: Dimished left lung, normal effort CV: RRR, no mgr GI: BS+, soft, nontender MSK: normal bulk and tone Derm: slight redness around chest tube site Neuro: awake, alert, no distress, MAEW     6/12 CXR > left chest tube, effusion  Resolved Hospital Problem list     Assessment & Plan:  Acute respiratory failure with hypoxemia in the setting of most likely recurrent empyema, pneumonia and now also pulmonary abscess, ?pleuro-cutaneous fistula Plan Continue current antibiotics Repeat CT chest today to see if we are draining all the fluid from the empyema Out of bed Walk today Repeat CXR in AM F/u culture from 6/11   Best practice:   Per [facility]   8/11, MD [place] PCCM Pager: [phone] Cell: [phone] If no response, call [phone]

## 2019-10-13 ENCOUNTER — Inpatient Hospital Stay: Payer: 59 | Admitting: Acute Care

## 2019-10-13 ENCOUNTER — Inpatient Hospital Stay (HOSPITAL_COMMUNITY): Payer: 59

## 2019-10-13 LAB — CBC
HCT: 26.5 % — ABNORMAL LOW (ref 39.0–52.0)
Hemoglobin: 8.9 g/dL — ABNORMAL LOW (ref 13.0–17.0)
MCH: 31.7 pg (ref 26.0–34.0)
MCHC: 33.6 g/dL (ref 30.0–36.0)
MCV: 94.3 fL (ref 80.0–100.0)
Platelets: 424 10*3/uL — ABNORMAL HIGH (ref 150–400)
RBC: 2.81 MIL/uL — ABNORMAL LOW (ref 4.22–5.81)
RDW: 15.9 % — ABNORMAL HIGH (ref 11.5–15.5)
WBC: 18.8 10*3/uL — ABNORMAL HIGH (ref 4.0–10.5)
nRBC: 0 % (ref 0.0–0.2)

## 2019-10-13 LAB — BASIC METABOLIC PANEL
Anion gap: 10 (ref 5–15)
BUN: 8 mg/dL (ref 6–20)
CO2: 25 mmol/L (ref 22–32)
Calcium: 7.7 mg/dL — ABNORMAL LOW (ref 8.9–10.3)
Chloride: 97 mmol/L — ABNORMAL LOW (ref 98–111)
Creatinine, Ser: 0.59 mg/dL — ABNORMAL LOW (ref 0.61–1.24)
GFR calc Af Amer: >60 mL/min (ref >60)
GFR calc non Af Amer: >60 mL/min (ref >60)
Glucose, Bld: 143 mg/dL — ABNORMAL HIGH (ref 70–99)
Potassium: 3.4 mmol/L — ABNORMAL LOW (ref 3.5–5.1)
Sodium: 132 mmol/L — ABNORMAL LOW (ref 135–145)

## 2019-10-13 MED ORDER — LINEZOLID 600 MG/300ML IV SOLN
600.0000 mg | Freq: Two times a day (BID) | INTRAVENOUS | Status: DC
  Administered 2019-10-13 – 2019-10-15 (×4): 600 mg via INTRAVENOUS
  Filled 2019-10-13 (×4): qty 300

## 2019-10-13 MED ORDER — POTASSIUM CHLORIDE CRYS ER 20 MEQ PO TBCR
20.0000 meq | EXTENDED_RELEASE_TABLET | Freq: Once | ORAL | Status: AC
  Administered 2019-10-13: 20 meq via ORAL
  Filled 2019-10-13: qty 1

## 2019-10-13 NOTE — TOC Initial Note (Signed)
Transition of Care [facility]) - Initial/Assessment Note    Patient Details  Name: Victor Torres MRN: [id number] Date of Birth: Mar 06, 1971  Transition of Care [facility]) CM/SW Contact:    Golda Acre, RN Phone Number: 10/13/2019, 9:01 AM  Clinical Narrative:                 Patient with history of copd, now with consolidation of the left lung and chest tube in place. From home lives alone.  No pcp. Plan:return to home will need follow up at [facility] and wellness clinic at discharge.  Expected Discharge Plan: Home/Self Care Barriers to Discharge: Continued Medical Work up   Patient Goals and CMS Choice Patient states their goals for this hospitalization and ongoing recovery are:: to go back home CMS Medicare.gov Compare Post Acute Care list provided to:: Patient    Expected Discharge Plan and Services Expected Discharge Plan: Home/Self Care   Discharge Planning Services: CM Consult   Living arrangements for the past 2 months: Single Family Home                                      Prior Living Arrangements/Services Living arrangements for the past 2 months: Single Family Home Lives with:: Self Patient language and need for interpreter reviewed:: No Do you feel safe going back to the place where you live?: Yes      Need for Family Participation in Patient Care: Yes (Comment) Care giver support system in place?: Yes (comment)   Criminal Activity/Legal Involvement Pertinent to Current Situation/Hospitalization: No - Comment as needed  Activities of Daily Living Home Assistive Devices/Equipment: None ADL Screening (condition at time of admission) Patient's cognitive ability adequate to safely complete daily activities?: Yes Is the patient deaf or have difficulty hearing?: No Does the patient have difficulty seeing, even when wearing glasses/contacts?: No Does the patient have difficulty concentrating, remembering, or making decisions?: No Patient able to  express need for assistance with ADLs?: Yes Does the patient have difficulty dressing or bathing?: No Independently performs ADLs?: Yes (appropriate for developmental age) Does the patient have difficulty walking or climbing stairs?: No Weakness of Legs: None Weakness of Arms/Hands: None  Permission Sought/Granted                  Emotional Assessment Appearance:: Appears stated age Attitude/Demeanor/Rapport: Engaged Affect (typically observed): Calm Orientation: : Oriented to Self, Oriented to Place, Oriented to  Time, Oriented to Situation Alcohol / Substance Use: Tobacco Use Psych Involvement: No (comment)  Admission diagnosis:  Hypokalemia [E87.6] Hyponatremia [E87.1] SOB (shortness of breath) [R06.02] Elevated troponin [R77.8] CAP (community acquired pneumonia) [J18.9] HCAP (healthcare-associated pneumonia) [J18.9] Empyema (HCC) [J86.9] Patient Active Problem List   Diagnosis Date Noted  . CAP (community acquired pneumonia) 10/09/2019  . Malnutrition of moderate degree 09/24/2019  . Alcohol abuse 09/24/2019  . Tobacco abuse 09/24/2019  . Hypokalemia   . Sepsis (HCC) 09/22/2019  . Acute respiratory failure with hypoxia (HCC) 09/22/2019  . Chest pain 09/22/2019  . Hyponatremia 09/22/2019  . Empyema (HCC) 09/21/2019   PCP:  Patient, No Pcp Per Pharmacy:   [facility] [address] - [place], [place] - [address], [address] [place] [address] Phone: [phone] Fax: [phone]     Social Determinants of Health (SDOH) Interventions    Readmission Risk Interventions No flowsheet data found.

## 2019-10-13 NOTE — Progress Notes (Signed)
PROGRESS NOTE    Victor Torres    Code Status: Full Code  DTO:[id number] DOB: November 18, 1970 DOA: 10/08/2019 LOS: 4 days  PCP: Patient, No Pcp Per CC:  Chief Complaint  Patient presents with  . Shortness of Breath       Hospital Summary   This is a 49 year old male with past medical history of tobacco use, alcohol abuse, recent admission from 5/23-5/29 for cute hypoxic respiratory failure and sepsis secondary to left sided empyema and pneumonia found to be positive for Streptococcus constellatus at which time he had a chest tube placed and was treated with IV antibiotics and discharged on p.o. antibiotics however has since had worsening symptoms.  He was supposed to follow-up in the office on 6/14 but due to his worsening symptoms once of breath and subjective fevers with productive cough and contacted his PCP who recommended patient be seen either by urgent care or ED and he presented to [facility] yesterday morning.  He had a chest x-ray which showed marked severity infiltrates within the mid left lung and base and small to moderate-sized left pleural effusion with trace amount of right pleural fluid.  Additionally at [facility] patient had noted elevated troponin from 23->106 and cardiology was consulted by ED physician who did not recommend starting on heparin and thought this was due to demand.  Patient was transferred to [facility] for further management.  CT chest without contrast showed recurrent left-sided empyema with development of a thick walled air and fluid collection in the left hemithorax measuring approximately 14.8 x 7.3 cm. Between the chest wall and this fluid collection there does not appear to be normal intervening lung as well as small bilateral pleural effusions and mediastinal and hilar adenopathy presumably reactive.  He was admitted and started on broad-spectrum antibiotics with vancomycin/metronidazole/cefepime and PCCM was consulted.  6/11: Transfer to stepdown unit for closer observation and  chest tube placed by PCCM with 750 cc purulent material drained immediately  A & P   Active Problems:   CAP (community acquired pneumonia)  1. 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 Recent Streptococcus constellatus empyema, failed outpatient cefdinir b. Day 5 vancomycin/cefepime/metronidazole -DC metronidazole c. Blood cultures with no growth to date x2 d. Chest tube to suction, cultures with Gram Positive Cocci -final culture still pending e. Incentive spirometry f. PCCM on board -fluid collection continues to decrease then consider DC chest tube in 24 to 48 hours  2. Left chest/axilla pain secondary to chest tube and infection above a. [place] with subcutaneous edema without fluid collection b. Continue current pain regimen  3. RLQ abdominal pain, resolved  4. Elevated troponin, demand ischemia a. unremarkable echo at recent hospitalization b. Cardiology contacted by ED physician who did not recommend heparin and thought this was demand c. Lipid panel with very low HDL <10, not calculated LDL d. Telemetry  5. Constipation a. MiraLAX and plan as above  6. Tobacco use a. Nicotine patch b. PFTs as outpatient  7. Hypokalemia, replete  8. Hepatomegaly, probably from alcohol  9. Anemia of chronic disease 1. stable 2. Hold off on transfusion for now  10. Hyponatremia 1. 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 Hypokalemia resolved  12. Superficial Sacral pressure ulcer, present on admission 1. Wound care   13. Constipation 1. MiraLAX   DVT prophylaxis: enoxaparin (LOVENOX) injection 40 mg  Start: 10/09/19 2200 SCDs Start: 10/09/19 1653  Family Communication: Patient wished to update family on his own Disposition Plan:  Status is: Inpatient  Remains inpatient appropriate  because:IV treatments appropriate due to intensity of illness or inability to take PO and Inpatient level of care appropriate due to severity of illness   Dispo: The patient is from: Home              Anticipated d/c is to: TBD              Anticipated d/c date is: > 3 days              Patient currently is not medically stable to d/c.          Pressure injury documentation    None  Consultants  PCCM  Procedures  6/11: Chest tube  Antibiotics   Anti-infectives (From admission, onward)   Start     Dose/Rate Route Frequency Ordered Stop   10/13/19 2200  linezolid (ZYVOX) IVPB 600 mg     Discontinue     600 mg 300 mL/hr over 60 Minutes Intravenous Every 12 hours 10/13/19 1353     10/10/19 0200  vancomycin (VANCOCIN) IVPB 1000 mg/200 mL premix  Status:  Discontinued        1,000 mg 200 mL/hr over 60 Minutes Intravenous Every 8 hours 10/09/19 1834 10/13/19 1352   10/09/19 2200  metroNIDAZOLE (FLAGYL) IVPB 500 mg  Status:  Discontinued        500 mg 100 mL/hr over 60 Minutes Intravenous Every 8 hours 10/09/19 1706 10/13/19 0920   10/09/19 2200  ceFEPIme (MAXIPIME) 2 g in sodium chloride 0.9 % 100 mL IVPB     Discontinue     2 g 200 mL/hr over 30 Minutes Intravenous Every 8 hours 10/09/19 1833     10/09/19 1745  vancomycin (VANCOCIN) IVPB 1000 mg/200 mL premix        1,000 mg 200 mL/hr over 60 Minutes Intravenous STAT 10/09/19 1740 10/09/19 1926   10/09/19 1600  piperacillin-tazobactam (ZOSYN) IVPB 3.375 g        3.375 g 100 mL/hr over 30 Minutes Intravenous  Once 10/09/19 1546 10/09/19 1617   10/09/19 0000  vancomycin (VANCOCIN) IVPB 1000 mg/200 mL premix        1,000 mg 200 mL/hr over 60 Minutes Intravenous  Once 10/08/19 2350 10/09/19 0144   10/09/19 0000  piperacillin-tazobactam (ZOSYN) IVPB 3.375 g        3.375 g 100 mL/hr over 30 Minutes Intravenous  Once 10/08/19 2350 10/09/19 0041        Subjective   Still with some discomfort from chest tube but otherwise  improved shortness of breath and overall feeling a bit better.  Also complains of constipation but has not been taking his MiraLAX.  Otherwise no complaints or issues  Objective   Vitals:   10/13/19 1158 10/13/19 1237 10/13/19 1300 10/13/19 1400  BP:  (!) 133/40 (!) 149/47 (!) 155/40  Pulse:  76 74 79  Resp:  (!) 26 20 (!) 25  Temp: 97.8 F (36.6 C)     TempSrc: Axillary     SpO2:  93% 94% 95%  Weight:      Height:        Intake/Output Summary (Last 24 hours) at 10/13/2019 1458 Last data filed at 10/13/2019 1236 Gross per 24 hour  Intake 1259.48 ml  Output 1180 ml  Net 79.48 ml   [employer]  10/09/19 1738  Weight: 75.5 kg    Examination:  Physical Exam Vitals and nursing note reviewed.  Constitutional:      Appearance: Normal appearance.  HENT:     Head: Normocephalic and atraumatic.  Eyes:     Conjunctiva/sclera: Conjunctivae normal.  Cardiovascular:     Rate and Rhythm: Normal rate and regular rhythm.  Pulmonary:     Effort: Pulmonary effort is normal.     Breath sounds: Normal breath sounds.     Comments: Chest tube draining Abdominal:     General: Abdomen is flat.     Palpations: Abdomen is soft.  Musculoskeletal:        General: No swelling or tenderness.  Skin:    Coloration: Skin is not jaundiced or pale.  Neurological:     Mental Status: He is alert. Mental status is at baseline.  Psychiatric:        Mood and Affect: Mood normal.        Behavior: Behavior normal.        Data Reviewed: I have personally reviewed following labs and imaging studies  CBC: Recent Labs  Lab 10/08/19 2326 10/08/19 2326 10/09/19 1714 10/10/19 0350 10/11/19 0155 10/12/19 0225 10/13/19 0315  WBC 25.2*   < > 20.2* 21.3* 18.1* 18.7* 18.8*  NEUTROABS 21.6*  --   --   --   --   --   --   HGB 10.7*   < > 8.9* 8.0* 8.2* 8.2* 8.9*  HCT 32.1*   < > 27.1* 25.3* 25.5* 24.9* 26.5*  MCV 94.1   < > 95.1 95.8 93.8 93.3 94.3  PLT 372   < > 330 296 334 376 424*   < > =  values in this interval not displayed.   Basic Metabolic Panel: Recent Labs  Lab 10/09/19 1714 10/10/19 0350 10/11/19 0155 10/12/19 0225 10/13/19 0315  NA 132* 129* 129* 131* 132*  K 4.1 3.2* 3.4* 3.5 3.4*  CL 98 97* 96* 97* 97*  CO2 26 22 26 27 25   GLUCOSE 111* 127* 127* 145* 143*  BUN 17 12 8 11 8   CREATININE 0.68 0.62 0.53* 0.63 0.59*  CALCIUM 7.8* 7.8* 7.7* 7.7* 7.7*   GFR: Estimated Creatinine Clearance: 119.3 mL/min (A) (by C-G formula based on SCr of 0.59 mg/dL (L)). Liver Function Tests: Recent Labs  Lab 10/10/19 0814  AST 17  ALT 17  ALKPHOS 94  BILITOT 0.8  PROT 6.1*  ALBUMIN 1.7*   No results for input(s): LIPASE, AMYLASE in the last 168 hours. No results for input(s): AMMONIA in the last 168 hours. Coagulation Profile: Recent Labs  Lab 10/10/19 0350  INR 1.4*   Cardiac Enzymes: No results for input(s): CKTOTAL, CKMB, CKMBINDEX, TROPONINI in the last 168 hours. BNP (last 3 results) No results for input(s): PROBNP in the last 8760 hours. HbA1C: No results for input(s): HGBA1C in the last 72 hours. CBG: No results for input(s): GLUCAP in the last 168 hours. Lipid Profile: No results for input(s): CHOL, HDL, LDLCALC, TRIG, CHOLHDL, LDLDIRECT in the last 72 hours. Thyroid Function Tests: No results for input(s): TSH, T4TOTAL, FREET4, T3FREE, THYROIDAB in the last 72 hours. Anemia Panel: No results for input(s): VITAMINB12, FOLATE, FERRITIN, TIBC, IRON, RETICCTPCT in the last 72 hours. Sepsis Labs: Recent Labs  Lab 10/10/19 0350  PROCALCITON 0.72    Recent Results (from the past 240 hour(s))  Blood culture (routine x 2)     Status: None (Preliminary result)   Collection  Time: 10/08/19 11:56 PM   Specimen: BLOOD  Result Value Ref Range Status   Specimen Description   Final    BLOOD RIGHT ANTECUBITAL Performed at [facility], [address] [place], [place] [address]    Special Requests   Final    BOTTLES DRAWN AEROBIC AND  ANAEROBIC Blood Culture adequate volume Performed at [facility], [address] [place], [place] [address]    Culture   Final    NO GROWTH 4 DAYS Performed at [facility], [address] [place], [place] [address]    Report Status PENDING  Incomplete  SARS Coronavirus 2 by RT PCR (hospital order, performed in [facility] hospital lab) Nasopharyngeal Nasopharyngeal Swab     Status: None   Collection Time: 10/09/19 12:08 AM   Specimen: Nasopharyngeal Swab  Result Value Ref Range Status   SARS Coronavirus 2 NEGATIVE NEGATIVE Final    Comment: (NOTE) SARS-CoV-2 target nucleic acids are NOT DETECTED.  The SARS-CoV-2 RNA is generally detectable in upper and lower respiratory specimens during the acute phase of infection. The lowest concentration of SARS-CoV-2 viral copies this assay can detect is 250 copies / mL. A negative result does not preclude SARS-CoV-2 infection and should not be used as the sole basis for treatment or other patient management decisions.  A negative result may occur with improper specimen collection / handling, submission of specimen other than nasopharyngeal swab, presence of viral mutation(s) within the areas targeted by this assay, and inadequate number of viral copies (<250 copies / mL). A negative result must be combined with clinical observations, patient history, and epidemiological information.  Fact Sheet for Patients:   BoilerBrush.com.cy  Fact Sheet for Healthcare Providers: https://pope.com/  This test is not yet approved or  cleared by the [place] FDA and has been authorized for detection and/or diagnosis of SARS-CoV-2 by FDA under an Emergency Use Authorization (EUA).  This EUA will remain in effect (meaning this test can be used) for the duration of the COVID-19 declaration under Section 564(b)(1) of the Act, 21 U.S.C. section 360bbb-3(b)(1), unless the authorization is  terminated or revoked sooner.  Performed at [facility], [address] [place], [place] [address]   Blood culture (routine x 2)     Status: None (Preliminary result)   Collection Time: 10/09/19 12:10 AM   Specimen: BLOOD  Result Value Ref Range Status   Specimen Description   Final    BLOOD LEFT ANTECUBITAL Performed at [facility], [address] [place], [place] [address]    Special Requests   Final    BOTTLES DRAWN AEROBIC AND ANAEROBIC Blood Culture adequate volume Performed at [facility], [address] [place], [place] [address]    Culture   Final    NO GROWTH 4 DAYS Performed at [facility], [address], [place], [place] [address]    Report Status PENDING  Incomplete  MRSA PCR Screening     Status: None   Collection Time: 10/09/19  5:06 PM   Specimen: Nasal Mucosa; Nasopharyngeal  Result Value Ref Range Status   MRSA by PCR NEGATIVE NEGATIVE Final    Comment:        The GeneXpert MRSA Assay (FDA approved for NASAL specimens only), is one component of a comprehensive MRSA colonization surveillance program. It is not intended to diagnose MRSA infection nor to guide or monitor treatment for  MRSA infections. Performed at [facility], [address], [place], [place] [address]   MRSA PCR Screening     Status: None   Collection Time: 10/10/19  3:08 PM   Specimen: Nasal Mucosa; Nasopharyngeal  Result Value Ref Range Status   MRSA by PCR NEGATIVE NEGATIVE Final    Comment:        The GeneXpert MRSA Assay (FDA approved for NASAL specimens only), is one component of a comprehensive MRSA colonization surveillance program. It is not intended to diagnose MRSA infection nor to guide or monitor treatment for MRSA infections. Performed at [facility], [address], [place], [place] [address]   Body fluid culture     Status: None (Preliminary result)   Collection Time: 10/10/19   6:47 PM   Specimen: Pleura; Body Fluid  Result Value Ref Range Status   Specimen Description   Final    PLEURAL Performed at [facility], [address] [place], [place] [address]    Special Requests   Final    NONE Performed at [facility], [address] [place], [place] [address]    Gram Stain   Final    ABUNDANT WBC PRESENT, PREDOMINANTLY PMN FEW GRAM POSITIVE COCCI CRITICAL RESULT CALLED TO, READ BACK BY AND VERIFIED WITH: Katrinka Blazing RN 10/10/19 2352 JDW    Culture   Final    NO GROWTH 3 DAYS Performed at [facility], [address], [place], [place] [address]    Report Status PENDING  Incomplete         Radiology Studies: CT CHEST WO CONTRAST  Result Date: 10/12/2019 CLINICAL DATA:  Empyema.  Follow-up chest tube placement. EXAM: CT CHEST WITHOUT CONTRAST TECHNIQUE: Multidetector CT imaging of the chest was performed following the standard protocol without IV contrast. COMPARISON:  CT 3 days ago, additional priors including chest CT 09/26/2019. FINDINGS: Cardiovascular: Aortic atherosclerosis. Static ascending aorta maximal dimension 4.2 cm. No periaortic stranding. Upper normal heart size with coronary artery calcifications. No pericardial effusion. Mediastinum/Nodes: Multiple enlarged lower paratracheal nodes measuring up to 15 mm. There are prominent prevascular nodes. Limited assessment for hilar adenopathy in the absence of IV contrast. There prominent left axillary nodes. No esophageal wall thickening. No visualized thyroid nodule. Lungs/Pleura: Left chest tube with significant decrease in air-fluid collection in the left hemithorax since prior CT. Small residual collection at the left lung base. Heterogeneous consolidation throughout the left lower lobe with ground-glass and consolidative opacities abutting the fissure. Dense rounded masslike opacity in the periphery of the left upper lobe. Surrounding ground-glass and air  bronchograms which extends to the hilum and fissure, tracking towards the superior segment, slight improvement in ground-glass consolidation from prior exam. Additional mild ground-glass and patchy opacities extend to the superior segment of the upper lobe. Underlying emphysema. Trace right pleural effusion with adjacent compressive atelectasis. Upper Abdomen: Ill-defined air in fluid in the left chest wall just superior to the chest tube. Streak artifact from arms down positioning limits detailed assessment. Soft tissue air tracks in the chest wall musculature posteriorly. No definite fluid collection within the abdominal cavity. Suggestion of slight nodular hepatic contours. Musculoskeletal: No bony destruction or periosteal reaction, particularly in region of consolidation. There are no acute or suspicious osseous abnormalities. Degenerative change in the spine. IMPRESSION: 1. Left chest tube with significant decrease in air-fluid collection in the left hemithorax since prior CT. Small residual collection at the left lung base. 2. Heterogeneous  consolidation throughout the left lower lobe with ground-glass and consolidative opacities abutting the fissure. Dense rounded masslike opacity in the periphery of the left upper lobe. Surrounding ground-glass and consolidative opacities which extends to the hilum and fissure, slight improvement in consolidation from prior CT. Findings favor complicated pneumonia, however recommend continued follow-up to ensure resolution and exclude underlying neoplasm. Neoplasm is felt less likely given lack of underlying pulmonary mass on recent chest CT. 3. Trace right pleural effusion with adjacent compressive atelectasis. 4. Mediastinal adenopathy is likely reactive. Aortic Atherosclerosis (ICD10-I70.0) and Emphysema (ICD10-J43.9). Electronically Signed   By: Narda Rutherford M.D.   On: 10/12/2019 15:37   DG CHEST PORT 1 VIEW  Result Date: 10/13/2019 CLINICAL DATA:  Empyema EXAM:  PORTABLE CHEST 1 VIEW COMPARISON:  Radiograph 10/12/2019, CT 10/12/2019 FINDINGS: Stable cardiac silhouette. LEFT chest tube in place without pneumothorax. Large peripheral consolidation in the lateral mid LEFT lung again noted. RIGHT lung clear IMPRESSION: 1. No interval change. 2. Large focus of consolidation in LEFT lung with chest tube place. Electronically Signed   By: Genevive Bi M.D.   On: 10/13/2019 07:51   DG CHEST PORT 1 VIEW  Result Date: 10/12/2019 CLINICAL DATA:  Empyema of the lung. EXAM: PORTABLE CHEST 1 VIEW COMPARISON:  10/11/2019; 10/10/2018; 10/09/2019; chest CT-10/09/2019 FINDINGS: Grossly unchanged cardiac silhouette and mediastinal contours with partial obscuration of left heart border secondary to grossly unchanged heterogeneous/consolidative opacity involving the peripheral and basilar aspect of the left mid/lower lung. Stable position of support apparatus. No pneumothorax. Trace left-sided pleural effusion is not excluded. The right lung remains well aerated. No acute osseous abnormalities. Slight reduction in the amount of left lateral chest wall subcutaneous emphysema. IMPRESSION: 1.  Stable positioning of support apparatus.  No pneumothorax. 2. Similar appearance of heterogeneous/consolidative opacity involving the mid and lower aspect of the left lung compatible provided history of empyema. Electronically Signed   By: Simonne Come M.D.   On: 10/12/2019 04:29        Scheduled Meds: . Chlorhexidine Gluconate Cloth  6 each Topical Daily  . enoxaparin (LOVENOX) injection  40 mg Subcutaneous Q24H  . folic acid  1 mg Oral Daily  . nicotine  21 mg Transdermal Daily  . pantoprazole  40 mg Oral Daily  . pneumococcal 23 valent vaccine  0.5 mL Intramuscular Tomorrow-1000  . polyethylene glycol  17 g Oral Daily  . sodium chloride flush  10 mL Intracatheter Q8H  . thiamine  100 mg Oral Daily   Continuous Infusions: . sodium chloride 10 mL/hr at 10/13/19 1236  . ceFEPime  (MAXIPIME) IV Stopped (10/13/19 1420)  . linezolid (ZYVOX) IV       Time spent:86minutes with over 50% of the time coordinating the patient's care    Jae Dire, DO [facility] Pager [phone]  Call night coverage person covering after 7pm

## 2019-10-13 NOTE — Progress Notes (Addendum)
   NAME:  Victor Torres, MRN:  [id number], DOB:  [birth year], LOS: 4 ADMISSION DATE:  10/08/2019, CONSULTATION DATE: June 10 REFERRING MD: Henrene Hawking, CHIEF COMPLAINT: Dyspnea  Brief History   49 yo male smoker with hx of ETOH admitted from 09/21/19 to 09/27/19 with Lt lung pneumonia with empyema.  Had chest tube placed on 5/24 and removed on 5/29.  Pleural fluid culture from 5/23 grew Streptococcus constellatus.  He was to continue ABx for total course of 21 days, and d/c home with omnicef.  He returned to ER on 10/08/19 with fever, productive cough and dyspnea.  Found to have recurrent Lt pleural space empyema and had chest tube placed again on 10/10/19.  Past Medical History  Alcohol abuse  Significant Hospital Events   6/09 Admit 6/11 Lt chest tube placed  Consults:    Procedures:  Lt chest tube 6/11 >>   Significant Diagnostic Tests:   CT chest 6/10 >>  Consolidation LUL and LLL, centrilobular emphysema, thick walled air/fluid collection Lt hemithorax 14.8 x 7.3 cm  CT chest 6/13 >> 4.2 cm ascending aorta, Lt chest tube in place with decreased Lt air-fluid collection, dense rounded mass like consolidation LUL, LLL consolidation, centrilobular emphysema  Micro Data:  Lt pleural fluid 5/23 >> Streptococcus constellatus MRSA PCR 6/10 >> negative Blood 6/10 >> Lt pleural fluid 6/11 >> GPC >>  Antimicrobials:  Vancomycin 6/09 >> Zosyn 6/09 >> 6/10 Flagyl 6/10 >> 6/13 Cefepime 6/10 >>  Interim history/subjective:  Not as much cough.  Breathing better.  Objective   Blood pressure (!) 130/43, pulse 72, temperature 98.5 F (36.9 C), temperature source Oral, resp. rate 18, height 6\' 1"  (1.854 m), weight 75.5 kg, SpO2 92 %.        Intake/Output Summary (Last 24 hours) at 10/13/2019 0859 Last data filed at 10/13/2019 0744 Gross per 24 hour  Intake 1928.68 ml  Output 1130 ml  Net 798.68 ml   Filed Weights   10/09/19 1738  Weight: 75.5 kg    Examination:  General -  alert Eyes - pupils reactive ENT - no sinus tenderness, no stridor Cardiac - regular rate/rhythm, no murmur Chest - decreased BS Lt > Rt, no wheeze, Lt chest tube in place w/o air leak Abdomen - soft, non tender, + bowel sounds Extremities - no cyanosis, clubbing, or edema Skin - no rashes Neuro - normal strength, moves extremities, follows commands Psych - normal mood and behavior  Assessment & Plan:   Recurrent Lt chest empyema with Lt upper and lower lobe pneumonia. - continue chest tube for now - f/u CT chest from 6/13 shows improvement in Lt pleural space - f/u CXR - if fluid collection continues to decrease, then consider d/c chest tube in next 24 to 48 hrs - continue vancomycin, cefepime pending pleural fluid culture results - d/c flagyl  Hx of tobacco abuse with changes of centrilobular emphysema on CT chest. - nicotine patch - will need PFTs as outpt when clinically stable - prn albuterol  Aortic root dilation. - will need monitor as outpt  Signature:  7/13, MD [facility] Pulmonary/Critical Care Pager - [phone] 10/13/2019, 9:17 AM

## 2019-10-14 ENCOUNTER — Inpatient Hospital Stay (HOSPITAL_COMMUNITY): Payer: 59

## 2019-10-14 LAB — CBC
HCT: 27.6 % — ABNORMAL LOW (ref 39.0–52.0)
Hemoglobin: 8.9 g/dL — ABNORMAL LOW (ref 13.0–17.0)
MCH: 30.6 pg (ref 26.0–34.0)
MCHC: 32.2 g/dL (ref 30.0–36.0)
MCV: 94.8 fL (ref 80.0–100.0)
Platelets: 484 10*3/uL — ABNORMAL HIGH (ref 150–400)
RBC: 2.91 MIL/uL — ABNORMAL LOW (ref 4.22–5.81)
RDW: 16 % — ABNORMAL HIGH (ref 11.5–15.5)
WBC: 22.3 10*3/uL — ABNORMAL HIGH (ref 4.0–10.5)
nRBC: 0 % (ref 0.0–0.2)

## 2019-10-14 LAB — BODY FLUID CULTURE: Culture: NO GROWTH

## 2019-10-14 LAB — CULTURE, BLOOD (ROUTINE X 2)
Culture: NO GROWTH
Culture: NO GROWTH
Special Requests: ADEQUATE
Special Requests: ADEQUATE

## 2019-10-14 LAB — BASIC METABOLIC PANEL
Anion gap: 9 (ref 5–15)
BUN: 10 mg/dL (ref 6–20)
CO2: 25 mmol/L (ref 22–32)
Calcium: 7.6 mg/dL — ABNORMAL LOW (ref 8.9–10.3)
Chloride: 97 mmol/L — ABNORMAL LOW (ref 98–111)
Creatinine, Ser: 0.49 mg/dL — ABNORMAL LOW (ref 0.61–1.24)
GFR calc Af Amer: >60 mL/min (ref >60)
GFR calc non Af Amer: >60 mL/min (ref >60)
Glucose, Bld: 176 mg/dL — ABNORMAL HIGH (ref 70–99)
Potassium: 3.3 mmol/L — ABNORMAL LOW (ref 3.5–5.1)
Sodium: 131 mmol/L — ABNORMAL LOW (ref 135–145)

## 2019-10-14 MED ORDER — POTASSIUM CHLORIDE CRYS ER 20 MEQ PO TBCR
20.0000 meq | EXTENDED_RELEASE_TABLET | Freq: Two times a day (BID) | ORAL | Status: AC
  Administered 2019-10-14 (×2): 20 meq via ORAL
  Filled 2019-10-14 (×2): qty 1

## 2019-10-14 MED ORDER — ENSURE ENLIVE PO LIQD
237.0000 mL | Freq: Two times a day (BID) | ORAL | Status: DC
  Administered 2019-10-14 – 2019-10-15 (×2): 237 mL via ORAL

## 2019-10-14 MED FILL — Fentanyl Citrate Preservative Free (PF) Inj 100 MCG/2ML: INTRAMUSCULAR | Qty: 2 | Status: AC

## 2019-10-14 NOTE — Plan of Care (Signed)
RN has encouraged patient to increase activity (ambulation or out of bed to chair) to prevent muscle wasting and promote sleep. Patient has minimal tolerance for activity due to pain. Patient expresses desire to get chest tube out and then he will move more. Patient verbalized discomfort with alternating pressure mattress common to hospital beds. Attempted to adjust for more comfortable setting. No other significant issues this shift.  Problem: Education: Goal: Knowledge of General Education information will improve Description: Including pain rating scale, medication(s)/side effects and non-pharmacologic comfort measures Outcome: Progressing   Problem: Health Behavior/Discharge Planning: Goal: Ability to manage health-related needs will improve Outcome: Progressing   Problem: Clinical Measurements: Goal: Ability to maintain clinical measurements within normal limits will improve Outcome: Progressing Goal: Will remain free from infection Outcome: Progressing Goal: Diagnostic test results will improve Outcome: Progressing Goal: Respiratory complications will improve Outcome: Progressing Goal: Cardiovascular complication will be avoided Outcome: Progressing   Problem: Activity: Goal: Risk for activity intolerance will decrease Outcome: Progressing   Problem: Nutrition: Goal: Adequate nutrition will be maintained Outcome: Progressing   Problem: Coping: Goal: Level of anxiety will decrease Outcome: Progressing   Problem: Elimination: Goal: Will not experience complications related to bowel motility Outcome: Progressing Goal: Will not experience complications related to urinary retention Outcome: Progressing   Problem: Pain Managment: Goal: General experience of comfort will improve Outcome: Progressing   Problem: Safety: Goal: Ability to remain free from injury will improve Outcome: Progressing   Problem: Skin Integrity: Goal: Risk for impaired skin integrity will  decrease Outcome: Progressing

## 2019-10-14 NOTE — Progress Notes (Signed)
   NAME:  Victor Torres, MRN:  [id number], DOB:  [birth year], LOS: 5 ADMISSION DATE:  10/08/2019, CONSULTATION DATE: June 10 REFERRING MD: Henrene Hawking, CHIEF COMPLAINT: Dyspnea  Brief History   49 yo male smoker with hx of ETOH admitted from 09/21/19 to 09/27/19 with Lt lung pneumonia with empyema.  Had chest tube placed on 5/24 and removed on 5/29.  Pleural fluid culture from 5/23 grew Streptococcus constellatus.  He was to continue ABx for total course of 21 days, and d/c home with omnicef.  He returned to ER on 10/08/19 with fever, productive cough and dyspnea.  Found to have recurrent Lt pleural space empyema and had chest tube placed again on 10/10/19.  Past Medical History  Alcohol abuse  Significant Hospital Events   6/09 Admit 6/11 Lt chest tube placed 6/15 d/c chest tube  Consults:    Procedures:  Lt chest tube 6/11 >> 6/15  Significant Diagnostic Tests:   CT chest 6/10 >>  Consolidation LUL and LLL, centrilobular emphysema, thick walled air/fluid collection Lt hemithorax 14.8 x 7.3 cm  CT chest 6/13 >> 4.2 cm ascending aorta, Lt chest tube in place with decreased Lt air-fluid collection, dense rounded mass like consolidation LUL, LLL consolidation, centrilobular emphysema  Micro Data:  Lt pleural fluid 5/23 >> Streptococcus constellatus MRSA PCR 6/10 >> negative Blood 6/10 >> Lt pleural fluid 6/11 >> GPC >>  Antimicrobials:  Vancomycin 6/09 >> Zosyn 6/09 >> 6/10 Flagyl 6/10 >> 6/13 Cefepime 6/10 >>  Interim history/subjective:  Pain at chest tube site.  Not much cough.  Objective   Blood pressure 132/66, pulse 76, temperature 98.2 F (36.8 C), temperature source Oral, resp. rate (!) 25, height 6\' 1"  (1.854 m), weight 75.5 kg, SpO2 95 %.        Intake/Output Summary (Last 24 hours) at 10/14/2019 10/16/2019 Last data filed at 10/14/2019 10/16/2019 Gross per 24 hour  Intake 1376.03 ml  Output 600 ml  Net 776.03 ml   Filed Weights   10/09/19 1738  Weight: 75.5 kg     Examination:  General - alert Eyes - pupils reactive ENT - no sinus tenderness, no stridor Cardiac - regular rate/rhythm, no murmur Chest - decreased BS, no wheeze, Lt chest tube in place Abdomen - soft, non tender, + bowel sounds Extremities - no cyanosis, clubbing, or edema Skin - no rashes Neuro - normal strength, moves extremities, follows commands Psych - normal mood and behavior   Assessment & Plan:   Recurrent Lt chest empyema with Lt upper and lower lobe pneumonia. - f/u CT chest from 6/13 shows improvement in Lt pleural space - minimal output from chest tube; d/c chest tube 6/15 - f/u CXR - continue vancomycin, cefepime pending pleural fluid culture results  Hx of tobacco abuse with changes of centrilobular emphysema on CT chest. - nicotine patch - will need PFTs as outpt when clinically stable - prn albuterol  Aortic root dilation. - will need monitor as outpt  Signature:  7/15, MD [facility] Pulmonary/Critical Care Pager - [phone] 10/14/2019, 9:03 AM

## 2019-10-14 NOTE — Progress Notes (Signed)
PROGRESS NOTE    Victor Torres    Code Status: Full Code  QPY:[id number] DOB: [birth year] DOA: 10/08/2019 LOS: 5 days  PCP: Patient, No Pcp Per CC:  Chief Complaint  Patient presents with  . Shortness of Breath       Hospital Summary   This is a 49 year old male with past medical history of tobacco use, alcohol abuse, recent admission from 5/23-5/29 for cute hypoxic respiratory failure and sepsis secondary to left sided empyema and pneumonia found to be positive for Streptococcus constellatus at which time he had a chest tube placed and was treated with IV antibiotics and discharged on p.o. antibiotics however has since had worsening symptoms.  He was supposed to follow-up in the office on 6/14 but due to his worsening symptoms once of breath and subjective fevers with productive cough and contacted his PCP who recommended patient be seen either by urgent care or ED and he presented to [facility] yesterday morning.  He had a chest x-ray which showed marked severity infiltrates within the mid left lung and base and small to moderate-sized left pleural effusion with trace amount of right pleural fluid.  Additionally at [facility] patient had noted elevated troponin from 23->106 and cardiology was consulted by ED physician who did not recommend starting on heparin and thought this was due to demand.  Patient was transferred to [facility] for further management.  CT chest without contrast showed recurrent left-sided empyema with development of a thick walled air and fluid collection in the left hemithorax measuring approximately 14.8 x 7.3 cm. Between the chest wall and this fluid collection there does not appear to be normal intervening lung as well as small bilateral pleural effusions and mediastinal and hilar adenopathy presumably reactive.  He was admitted and started on broad-spectrum antibiotics with vancomycin/metronidazole/cefepime and PCCM was consulted.  6/11: Transfer to stepdown unit for closer observation and  chest tube placed by PCCM with 750 cc purulent material drained immediately  6/14: Vancomycin changed to linezolid due to short supply of vancomycin trough reagent per pharmacy.  6/15: chest tube removed, Transferred to general floor  A & P   Active Problems:   CAP (community acquired pneumonia)  1. 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 Recent Streptococcus constellatus empyema, failed outpatient cefdinir b. Chest tube 6/11-> 6/15 c. Day 6 antibiotics, initially with vancomycin/cefepime/metronidazole, currently linezolid and cefepime d. Blood cultures with no growth to date x2 e. Pleural effusion cultures with Gram Positive Cocci -final culture still pending f. Incentive spirometry g. PCCM on board  2. Left chest/axilla pain secondary to chest tube and infection above a. [place] with subcutaneous edema without fluid collection b. Significantly improved now that chest tube is out  3. RLQ abdominal pain, resolved  4. Elevated troponin secondary to demand ischemia a. unremarkable echo at recent hospitalization b. Cardiology contacted by ED physician who did not recommend heparin and thought this was demand c. Lipid panel with very low HDL <10, not calculated LDL d. Telemetry  5. Constipation a. MiraLAX and plan as above  6. Tobacco use a. Nicotine patch b. PFTs as outpatient  7. Hypokalemia, replete  8. Hepatomegaly, probably from alcohol  9. Anemia of chronic disease 1. stable 2. Hold off on transfusion for now  10. Hyponatremia 1. 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 Superficial Sacral pressure ulcer, present on admission 1. Wound care  DVT prophylaxis: enoxaparin (LOVENOX) injection 40 mg Start: 10/09/19 2200 SCDs Start: 10/09/19 1653  Family Communication: Mother at bedside Disposition Plan:  Observe while chest tube has been removed.  PT eval.  Pleural cultures pending.  Needs antibiotic plan prior to discharge Status is: Inpatient  Remains inpatient appropriate because:IV treatments appropriate due to intensity of illness or inability to take PO and Inpatient level of care appropriate due to severity of illness   Dispo: The patient is from: Home              Anticipated d/c is to: TBD              Anticipated d/c date is: 3 days              Patient currently is not medically stable to d/c.          Pressure injury documentation    None  Consultants  PCCM  Procedures  Chest tube  6/11->6/15   Antibiotics   Anti-infectives (From admission, onward)   Start     Dose/Rate Route Frequency Ordered Stop   10/13/19 2200  linezolid (ZYVOX) IVPB 600 mg     Discontinue     600 mg 300 mL/hr over 60 Minutes Intravenous Every 12 hours 10/13/19 1353     10/10/19 0200  vancomycin (VANCOCIN) IVPB 1000 mg/200 mL premix  Status:  Discontinued        1,000 mg 200 mL/hr over 60 Minutes Intravenous Every 8 hours 10/09/19 1834 10/13/19 1352   10/09/19 2200  metroNIDAZOLE (FLAGYL) IVPB 500 mg  Status:  Discontinued        500 mg 100 mL/hr over 60 Minutes Intravenous Every 8 hours 10/09/19 1706 10/13/19 0920   10/09/19 2200  ceFEPIme (MAXIPIME) 2 g in sodium chloride 0.9 % 100 mL IVPB     Discontinue     2 g 200 mL/hr over 30 Minutes Intravenous Every 8 hours 10/09/19 1833     10/09/19 1745  vancomycin (VANCOCIN) IVPB 1000 mg/200 mL premix        1,000 mg 200 mL/hr over 60 Minutes Intravenous STAT 10/09/19 1740 10/09/19 1926   10/09/19 1600  piperacillin-tazobactam (ZOSYN) IVPB 3.375 g        3.375 g 100 mL/hr over 30 Minutes Intravenous  Once 10/09/19 1546 10/09/19 1617   10/09/19 0000  vancomycin (VANCOCIN) IVPB 1000 mg/200 mL premix        1,000 mg 200 mL/hr over 60 Minutes Intravenous  Once 10/08/19 2350 10/09/19 0144   10/09/19 0000  piperacillin-tazobactam (ZOSYN) IVPB  3.375 g        3.375 g 100 mL/hr over 30 Minutes Intravenous  Once 10/08/19 2350 10/09/19 0041        Subjective   Chest tube removed today prior to my arrival.  Patient states he is feeling much better now that the tube is out.  Denies any significant complaints at this time but states he has not had a bowel movement yet.  No other issues  Objective   Vitals:   10/14/19 0900 10/14/19 1000 10/14/19 1100 10/14/19 1200  BP: (!) 137/43 (!) 150/36 (!) 139/44 (!) 148/73  Pulse: 74 81 73 73  Resp: (!) 27 20 20  (!) 24  Temp:    (!) 97 F (36.1 C)  TempSrc:    Oral  SpO2: 93% 99% 97% 100%  Weight:      Height:        Intake/Output Summary (Last 24  hours) at 10/14/2019 1258 Last data filed at 10/14/2019 1109 Gross per 24 hour  Intake 1477 ml  Output 590 ml  Net 887 ml   Filed Weights   10/09/19 1738  Weight: 75.5 kg    Examination:  Physical Exam Vitals and nursing note reviewed.  Constitutional:      Appearance: Normal appearance.  HENT:     Head: Normocephalic and atraumatic.  Eyes:     Conjunctiva/sclera: Conjunctivae normal.  Cardiovascular:     Rate and Rhythm: Normal rate and regular rhythm.  Pulmonary:     Effort: Pulmonary effort is normal.     Breath sounds: Examination of the left-lower field reveals rales. Rales present.  Abdominal:     General: Abdomen is flat.     Palpations: Abdomen is soft.  Musculoskeletal:        General: No swelling or tenderness.  Skin:    Coloration: Skin is not jaundiced or pale.  Neurological:     Mental Status: He is alert. Mental status is at baseline.  Psychiatric:        Mood and Affect: Mood normal.        Behavior: Behavior normal.        Data Reviewed: I have personally reviewed following labs and imaging studies  CBC: Recent Labs  Lab 10/08/19 2326 10/09/19 1714 10/10/19 0350 10/11/19 0155 10/12/19 0225 10/13/19 0315 10/14/19 0312  WBC 25.2*   < > 21.3* 18.1* 18.7* 18.8* 22.3*  NEUTROABS 21.6*  --    --   --   --   --   --   HGB 10.7*   < > 8.0* 8.2* 8.2* 8.9* 8.9*  HCT 32.1*   < > 25.3* 25.5* 24.9* 26.5* 27.6*  MCV 94.1   < > 95.8 93.8 93.3 94.3 94.8  PLT 372   < > 296 334 376 424* 484*   < > = values in this interval not displayed.   Basic Metabolic Panel: Recent Labs  Lab 10/10/19 0350 10/11/19 0155 10/12/19 0225 10/13/19 0315 10/14/19 0312  NA 129* 129* 131* 132* 131*  K 3.2* 3.4* 3.5 3.4* 3.3*  CL 97* 96* 97* 97* 97*  CO2 22 26 27 25 25   GLUCOSE 127* 127* 145* 143* 176*  BUN 12 8 11 8 10   CREATININE 0.62 0.53* 0.63 0.59* 0.49*  CALCIUM 7.8* 7.7* 7.7* 7.7* 7.6*   GFR: Estimated Creatinine Clearance: 119.3 mL/min (A) (by C-G formula based on SCr of 0.49 mg/dL (L)). Liver Function Tests: Recent Labs  Lab 10/10/19 0814  AST 17  ALT 17  ALKPHOS 94  BILITOT 0.8  PROT 6.1*  ALBUMIN 1.7*   No results for input(s): LIPASE, AMYLASE in the last 168 hours. No results for input(s): AMMONIA in the last 168 hours. Coagulation Profile: Recent Labs  Lab 10/10/19 0350  INR 1.4*   Cardiac Enzymes: No results for input(s): CKTOTAL, CKMB, CKMBINDEX, TROPONINI in the last 168 hours. BNP (last 3 results) No results for input(s): PROBNP in the last 8760 hours. HbA1C: No results for input(s): HGBA1C in the last 72 hours. CBG: No results for input(s): GLUCAP in the last 168 hours. Lipid Profile: No results for input(s): CHOL, HDL, LDLCALC, TRIG, CHOLHDL, LDLDIRECT in the last 72 hours. Thyroid Function Tests: No results for input(s): TSH, T4TOTAL, FREET4, T3FREE, THYROIDAB in the last 72 hours. Anemia Panel: No results for input(s): VITAMINB12, FOLATE, FERRITIN, TIBC, IRON, RETICCTPCT in the last 72 hours. Sepsis Labs: Recent Labs  Lab 10/10/19  0350  PROCALCITON 0.72    Recent Results (from the past 240 hour(s))  Blood culture (routine x 2)     Status: None   Collection Time: 10/08/19 11:56 PM   Specimen: BLOOD  Result Value Ref Range Status   Specimen  Description   Final    BLOOD RIGHT ANTECUBITAL Performed at [facility] [place], [address] [place], [place] [address]    Special Requests   Final    BOTTLES DRAWN AEROBIC AND ANAEROBIC Blood Culture adequate volume Performed at [facility] [place], [address] [place], [place] [address]    Culture   Final    NO GROWTH 5 DAYS Performed at [facility], [address] [place], [place] [address]    Report Status 10/14/2019 FINAL  Final  SARS Coronavirus 2 by RT PCR (hospital order, performed in [facility] hospital lab) Nasopharyngeal Nasopharyngeal Swab     Status: None   Collection Time: 10/09/19 12:08 AM   Specimen: Nasopharyngeal Swab  Result Value Ref Range Status   SARS Coronavirus 2 NEGATIVE NEGATIVE Final    Comment: (NOTE) SARS-CoV-2 target nucleic acids are NOT DETECTED.  The SARS-CoV-2 RNA is generally detectable in upper and lower respiratory specimens during the acute phase of infection. The lowest concentration of SARS-CoV-2 viral copies this assay can detect is 250 copies / mL. A negative result does not preclude SARS-CoV-2 infection and should not be used as the sole basis for treatment or other patient management decisions.  A negative result may occur with improper specimen collection / handling, submission of specimen other than nasopharyngeal swab, presence of viral mutation(s) within the areas targeted by this assay, and inadequate number of viral copies (<250 copies / mL). A negative result must be combined with clinical observations, patient history, and epidemiological information.  Fact Sheet for Patients:   BoilerBrush.com.cyhttps://www.fda.gov/media/136312/download  Fact Sheet for Healthcare Providers: https://pope.com/https://www.fda.gov/media/136313/download  This test is not yet approved or  cleared by the [place] FDA and has been authorized for detection and/or diagnosis of SARS-CoV-2 by FDA under an Emergency Use Authorization (EUA).  This EUA will  remain in effect (meaning this test can be used) for the duration of the COVID-19 declaration under Section 564(b)(1) of the Act, 21 U.S.C. section 360bbb-3(b)(1), unless the authorization is terminated or revoked sooner.  Performed at [facility] [place], [address] [place], [place] [address]   Blood culture (routine x 2)     Status: None   Collection Time: 10/09/19 12:10 AM   Specimen: BLOOD  Result Value Ref Range Status   Specimen Description   Final    BLOOD LEFT ANTECUBITAL Performed at [facility] [place], [address] [place], [place] [address]    Special Requests   Final    BOTTLES DRAWN AEROBIC AND ANAEROBIC Blood Culture adequate volume Performed at [facility] [place], [address] [place], [place] [address]    Culture   Final    NO GROWTH 5 DAYS Performed at [facility], [address] [place], [place] [address]    Report Status 10/14/2019 FINAL  Final  MRSA PCR Screening     Status: None   Collection Time: 10/09/19  5:06 PM   Specimen: Nasal Mucosa; Nasopharyngeal  Result Value Ref Range Status   MRSA by PCR NEGATIVE NEGATIVE Final    Comment:        The GeneXpert MRSA Assay (FDA approved  for NASAL specimens only), is one component of a comprehensive MRSA colonization surveillance program. It is not intended to diagnose MRSA infection nor to guide or monitor treatment for MRSA infections. Performed at [facility], [address] [place], [place] [address]   MRSA PCR Screening     Status: None   Collection Time: 10/10/19  3:08 PM   Specimen: Nasal Mucosa; Nasopharyngeal  Result Value Ref Range Status   MRSA by PCR NEGATIVE NEGATIVE Final    Comment:        The GeneXpert MRSA Assay (FDA approved for NASAL specimens only), is one component of a comprehensive MRSA colonization surveillance program. It is not intended to diagnose MRSA infection nor to guide or monitor treatment for MRSA  infections. Performed at [facility], [address], [place], [place] [address]   Body fluid culture     Status: None (Preliminary result)   Collection Time: 10/10/19  6:47 PM   Specimen: Pleura; Body Fluid  Result Value Ref Range Status   Specimen Description   Final    PLEURAL Performed at [facility], [address], [place], [place] [address]    Special Requests   Final    NONE Performed at [facility], [address] [place], [place] [address]    Gram Stain   Final    ABUNDANT WBC PRESENT, PREDOMINANTLY PMN FEW GRAM POSITIVE COCCI CRITICAL RESULT CALLED TO, READ BACK BY AND VERIFIED WITH: Katrinka Blazing RN 10/10/19 2352 JDW    Culture   Final    NO GROWTH 4 DAYS Performed at [facility], [address] [place], [place] [address]    Report Status PENDING  Incomplete         Radiology Studies: CT CHEST WO CONTRAST  Result Date: 10/12/2019 CLINICAL DATA:  Empyema.  Follow-up chest tube placement. EXAM: CT CHEST WITHOUT CONTRAST TECHNIQUE: Multidetector CT imaging of the chest was performed following the standard protocol without IV contrast. COMPARISON:  CT 3 days ago, additional priors including chest CT 09/26/2019. FINDINGS: Cardiovascular: Aortic atherosclerosis. Static ascending aorta maximal dimension 4.2 cm. No periaortic stranding. Upper normal heart size with coronary artery calcifications. No pericardial effusion. Mediastinum/Nodes: Multiple enlarged lower paratracheal nodes measuring up to 15 mm. There are prominent prevascular nodes. Limited assessment for hilar adenopathy in the absence of IV contrast. There prominent left axillary nodes. No esophageal wall thickening. No visualized thyroid nodule. Lungs/Pleura: Left chest tube with significant decrease in air-fluid collection in the left hemithorax since prior CT. Small residual collection at the left lung base. Heterogeneous consolidation throughout the  left lower lobe with ground-glass and consolidative opacities abutting the fissure. Dense rounded masslike opacity in the periphery of the left upper lobe. Surrounding ground-glass and air bronchograms which extends to the hilum and fissure, tracking towards the superior segment, slight improvement in ground-glass consolidation from prior exam. Additional mild ground-glass and patchy opacities extend to the superior segment of the upper lobe. Underlying emphysema. Trace right pleural effusion with adjacent compressive atelectasis. Upper Abdomen: Ill-defined air in fluid in the left chest wall just superior to the chest tube. Streak artifact from arms down positioning limits detailed assessment. Soft tissue air tracks in the chest wall musculature posteriorly. No definite fluid collection within the abdominal cavity. Suggestion of slight nodular hepatic contours. Musculoskeletal: No bony destruction or periosteal reaction, particularly in region of consolidation. There are no acute or suspicious osseous abnormalities. Degenerative change in the  spine. IMPRESSION: 1. Left chest tube with significant decrease in air-fluid collection in the left hemithorax since prior CT. Small residual collection at the left lung base. 2. Heterogeneous consolidation throughout the left lower lobe with ground-glass and consolidative opacities abutting the fissure. Dense rounded masslike opacity in the periphery of the left upper lobe. Surrounding ground-glass and consolidative opacities which extends to the hilum and fissure, slight improvement in consolidation from prior CT. Findings favor complicated pneumonia, however recommend continued follow-up to ensure resolution and exclude underlying neoplasm. Neoplasm is felt less likely given lack of underlying pulmonary mass on recent chest CT. 3. Trace right pleural effusion with adjacent compressive atelectasis. 4. Mediastinal adenopathy is likely reactive. Aortic Atherosclerosis  (ICD10-I70.0) and Emphysema (ICD10-J43.9). Electronically Signed   By: Narda Rutherford M.D.   On: 10/12/2019 15:37   DG Chest Port 1 View  Result Date: 10/14/2019 CLINICAL DATA:  Empyema EXAM: PORTABLE CHEST 1 VIEW COMPARISON:  October 13, 2019 FINDINGS: The heart size and mediastinal contours are unchanged. Again noted is a loculated moderate left pleural effusion. There is adjacent airspace opacity. The right lung is clear. No acute osseous abnormality. A left-sided chest tube is seen. IMPRESSION: No active disease. Electronically Signed   By: Jonna Clark M.D.   On: 10/14/2019 05:09   DG CHEST PORT 1 VIEW  Result Date: 10/13/2019 CLINICAL DATA:  Empyema EXAM: PORTABLE CHEST 1 VIEW COMPARISON:  Radiograph 10/12/2019, CT 10/12/2019 FINDINGS: Stable cardiac silhouette. LEFT chest tube in place without pneumothorax. Large peripheral consolidation in the lateral mid LEFT lung again noted. RIGHT lung clear IMPRESSION: 1. No interval change. 2. Large focus of consolidation in LEFT lung with chest tube place. Electronically Signed   By: Genevive Bi M.D.   On: 10/13/2019 07:51        Scheduled Meds: . Chlorhexidine Gluconate Cloth  6 each Topical Daily  . enoxaparin (LOVENOX) injection  40 mg Subcutaneous Q24H  . folic acid  1 mg Oral Daily  . nicotine  21 mg Transdermal Daily  . pantoprazole  40 mg Oral Daily  . pneumococcal 23 valent vaccine  0.5 mL Intramuscular Tomorrow-1000  . polyethylene glycol  17 g Oral Daily  . potassium chloride  20 mEq Oral BID  . sodium chloride flush  10 mL Intracatheter Q8H  . thiamine  100 mg Oral Daily   Continuous Infusions: . sodium chloride 10 mL/hr at 10/14/19 1109  . ceFEPime (MAXIPIME) IV Stopped (10/14/19 1610)  . linezolid (ZYVOX) IV Stopped (10/14/19 1059)     Time spent: 26 minutes with over 50% of the time coordinating the patient's care    Jae Dire, DO [facility] Pager [phone]  Call night coverage person covering  after 7pm

## 2019-10-14 NOTE — Evaluation (Signed)
Physical Therapy Evaluation Patient Details Name: Victor Torres MRN: [id number] DOB: April 11, 1971 Today's Date: 10/14/2019   History of Present Illness  This is a 49 year old male with past medical history of tobacco use, alcohol abuse, recent admission from 5/23-5/29 for cute hypoxic respiratory failure and sepsis secondary to left sided empyema and pneumonia found to be positive for Streptococcus constellatus at which time he had a chest tube placed and was treated with IV antibiotics and discharged on p.o. antibiotics however has since had worsening symptoms.  He was supposed to follow-up in the office on 6/14 but due to his worsening symptoms once of breath and subjective fevers with productive cough and contacted his PCP who recommended patient be seen either by urgent care or ED and he presented to [facility] yesterday morning.  He had a chest x-ray which showed marked severity infiltrates within the mid left lung and base and small to moderate-sized left pleural effusion with trace amount of right pleural fluid.  Additionally at [facility] patient had noted elevated troponin from 23->106 and cardiology was consulted by ED physician who did not recommend starting on heparin and thought this was due to demand.  Patient was transferred to [facility] for further management.  CT chest without contrast showed recurrent left-sided empyema with development of a thick walled air and fluid collection in the left hemithorax measuring approximately 14.8 x 7.3 cm. Between the chest wall and this fluid collection there does not appear to be normal intervening lung as well as small bilateral pleural effusions and mediastinal and hilar adenopathy presumably reactive.  He was admitted and started on broad-spectrum antibiotics with vancomycin/metronidazole/cefepime and PCCM was consulted. Patient had chest tube placed on 10/10/19 and removed 10/14/19.    Clinical Impression  Patient evaluated by Physical Therapy with no further acute PT  needs identified. All education has been completed and the patient has no further questions. Patient is functioning close to baseline of independent and requires supervision for mobility at this time. Patient is safe to mobilize with nursing staff at this time. See below for any follow-up Physical Therapy or equipment needs. PT is signing off. Thank you for this referral.     Follow Up Recommendations No PT follow up    Equipment Recommendations  None recommended by PT    Recommendations for Other Services       Precautions / Restrictions Precautions Precautions: Fall Restrictions Weight Bearing Restrictions: No      Mobility  Bed Mobility Overal bed mobility: Independent        General bed mobility comments: pt taking some extra time  Transfers Overall transfer level: Independent Equipment used: None Transfers: Sit to/from Stand Sit to Stand: Independent         General transfer comment: no difficulty, steady with rising.  Ambulation/Gait Ambulation/Gait assistance: Supervision Gait Distance (Feet): 400 Feet Assistive device: None Gait Pattern/deviations: Step-through pattern Gait velocity: fair   General Gait Details: Pt steady with no overt LOB noted, supervision for safety as first time up. HR remained in 90's during gait. SpO2 dropped to 64% but appeared to be artifact and then increased to 87% on RA and increaed to 98-100% when sitting in room.   Stairs         Wheelchair Mobility    Modified Rankin (Stroke Patients Only)       Balance Overall balance assessment: Mild deficits observed, not formally tested               Pertinent Vitals/Pain  Pain Assessment: 0-10 Pain Score: 7  Pain Location: L flank, at chest tube removal site Pain Descriptors / Indicators: Sore Pain Intervention(s): Limited activity within patient's tolerance;Monitored during session;Repositioned    Home Living Family/patient expects to be discharged to:: Private  residence Living Arrangements: Spouse/significant other Available Help at Discharge: Family;Available 24 hours/day Type of Home: House Home Access: Stairs to enter Entrance Stairs-Rails: None Entrance Stairs-Number of Steps: 1 Home Layout: One level Home Equipment: None      Prior Function Level of Independence: Independent         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   Dominant Hand: Right    Extremity/Trunk Assessment   Upper Extremity Assessment Upper Extremity Assessment: Overall WFL for tasks assessed    Lower Extremity Assessment Lower Extremity Assessment: Overall WFL for tasks assessed    Cervical / Trunk Assessment Cervical / Trunk Assessment: Normal Cervical / Trunk Exceptions: Lt chest tube removed, wound covered by dressing  Communication   Communication: No difficulties  Cognition Arousal/Alertness: Awake/alert Behavior During Therapy: WFL for tasks assessed/performed Overall Cognitive Status: Within Functional Limits for tasks assessed        General Comments: pt stating "I'm frustrated" when PT arrived. pt agreeable to moblize and stating "it would feel good to move". "I'd like to go home today if I could"      General Comments      Exercises     Assessment/Plan    PT Assessment Patent does not need any further PT services  PT Problem List         PT Treatment Interventions      PT Goals (Current goals can be found in the Care Plan section)  Acute Rehab PT Goals Patient Stated Goal: home today PT Goal Formulation: All assessment and education complete, DC therapy    Frequency  1x eval    AM-PAC PT "6 Clicks" Mobility  Outcome Measure Help needed turning from your back to your side while in a flat bed without using bedrails?: None Help needed moving from lying on your back to sitting on the side of a flat bed without using bedrails?: None Help needed moving to and from a bed to a chair (including a wheelchair)?:  None Help needed standing up from a chair using your arms (e.g., wheelchair or bedside chair)?: None Help needed to walk in hospital room?: A Little Help needed climbing 3-5 steps with a railing? : A Little 6 Click Score: 22    End of Session Equipment Utilized During Treatment: Gait belt Activity Tolerance: Patient tolerated treatment well Patient left: in chair;with call bell/phone within reach;with chair alarm set (alarm pad in place, RN aware no posey box in room) Nurse Communication: Mobility status PT Visit Diagnosis: Muscle weakness (generalized) (M62.81)    Time: 4098-1191 PT Time Calculation (min) (ACUTE ONLY): 31 min   Charges:   PT Evaluation $PT Eval Moderate Complexity: 1 Mod PT Treatments $Gait Training: 8-22 mins       Wynn Maudlin, DPT Acute Rehabilitation Services  Office [phone] Pager [phone]  10/14/2019 4:22 PM

## 2019-10-15 ENCOUNTER — Inpatient Hospital Stay (HOSPITAL_COMMUNITY): Payer: 59

## 2019-10-15 DIAGNOSIS — L8990 Pressure ulcer of unspecified site, unspecified stage: Secondary | ICD-10-CM | POA: Insufficient documentation

## 2019-10-15 LAB — CBC
HCT: 28.9 % — ABNORMAL LOW (ref 39.0–52.0)
Hemoglobin: 9.3 g/dL — ABNORMAL LOW (ref 13.0–17.0)
MCH: 30.6 pg (ref 26.0–34.0)
MCHC: 32.2 g/dL (ref 30.0–36.0)
MCV: 95.1 fL (ref 80.0–100.0)
Platelets: 579 10*3/uL — ABNORMAL HIGH (ref 150–400)
RBC: 3.04 MIL/uL — ABNORMAL LOW (ref 4.22–5.81)
RDW: 16.1 % — ABNORMAL HIGH (ref 11.5–15.5)
WBC: 24.6 10*3/uL — ABNORMAL HIGH (ref 4.0–10.5)
nRBC: 0 % (ref 0.0–0.2)

## 2019-10-15 LAB — GLUCOSE, CAPILLARY: Glucose-Capillary: 165 mg/dL — ABNORMAL HIGH (ref 70–99)

## 2019-10-15 LAB — BASIC METABOLIC PANEL
Anion gap: 9 (ref 5–15)
BUN: 8 mg/dL (ref 6–20)
CO2: 27 mmol/L (ref 22–32)
Calcium: 8 mg/dL — ABNORMAL LOW (ref 8.9–10.3)
Chloride: 96 mmol/L — ABNORMAL LOW (ref 98–111)
Creatinine, Ser: 0.56 mg/dL — ABNORMAL LOW (ref 0.61–1.24)
GFR calc Af Amer: >60 mL/min (ref >60)
GFR calc non Af Amer: >60 mL/min (ref >60)
Glucose, Bld: 109 mg/dL — ABNORMAL HIGH (ref 70–99)
Potassium: 3.9 mmol/L (ref 3.5–5.1)
Sodium: 132 mmol/L — ABNORMAL LOW (ref 135–145)

## 2019-10-15 MED ORDER — AMOXICILLIN-POT CLAVULANATE 875-125 MG PO TABS: 1.0000 | ORAL_TABLET | Freq: Two times a day (BID) | ORAL | 0 refills | Status: AC

## 2019-10-15 NOTE — Progress Notes (Signed)
Pharmacy Antibiotic Note  Victor Torres is a 49 y.o. male admitted on 10/08/2019 with acute hypoxic respiratory failure/sepsis secondary to recurrent pneumonia and suspected recurrent left-sided empyema.   10/15/2019: Day 7 abx Afebrile WBC remains elevated- up to 24.6 Scr stable,  Pleural fluid cx No growth Final  (gram stain + GPC) Chest tube out 6/15  Plan:  zyvox 600 mg IV q12- ? Change to PO  Continue Cefepime 2g IV q8h  Monitor renal function, cultures, clinical course  F/u abx plan  Height: 6\' 1"  (185.4 cm) Weight: 75.5 kg (166 lb 7.2 oz) IBW/kg (Calculated) : 79.9  Temp (24hrs), Avg:98 F (36.7 C), Min:97 F (36.1 C), Max:98.5 F (36.9 C)  Recent Labs  Lab 10/11/19 0155 10/12/19 0225 10/13/19 0315 10/14/19 0312 10/15/19 0515  WBC 18.1* 18.7* 18.8* 22.3* 24.6*  CREATININE 0.53* 0.63 0.59* 0.49* 0.56*    Estimated Creatinine Clearance: 119.3 mL/min (A) (by C-G formula based on SCr of 0.56 mg/dL (L)).    No Known Allergies  Antimicrobials this admission: 6/10 Zosyn x 2 doses 6/10 Vancomycin >>6/14 6/10 Cefepime >> 6/10 Metronidazole >> 6/14  6/14 Zyvox >>   Dose adjustments this admission:   Microbiology results: 6/9-6/10 BCx: NGF 6/10& 6/11 MRSA PCR: negative 6/11 Pleural fluid: NGF (gram stain- few GPC) Prev: 5/24 empyema fluid: abundant strep constellatus: I PCN, sens CTX, EES LVQ Vanc  Thank you for allowing pharmacy to be a part of this patient's care  6/24, Pharm.D 10/15/2019 8:41 AM

## 2019-10-15 NOTE — Progress Notes (Signed)
   NAME:  Victor Torres, MRN:  [id number], DOB:  [birth year], LOS: 6 ADMISSION DATE:  10/08/2019, CONSULTATION DATE: June 10 REFERRING MD: Henrene Hawking, CHIEF COMPLAINT: Dyspnea  Brief History   49 yo male smoker with hx of ETOH admitted from 09/21/19 to 09/27/19 with Lt lung pneumonia with empyema.  Had chest tube placed on 5/24 and removed on 5/29.  Pleural fluid culture from 5/23 grew Streptococcus constellatus.  He was to continue ABx for total course of 21 days, and d/c home with omnicef.  He returned to ER on 10/08/19 with fever, productive cough and dyspnea.  Found to have recurrent Lt pleural space empyema and had chest tube placed again on 10/10/19.  Past Medical History  Alcohol abuse  Significant Hospital Events   6/09 Admit 6/11 Lt chest tube placed 6/15 d/c chest tube  Consults:    Procedures:  Lt chest tube 6/11 >> 6/15  Significant Diagnostic Tests:   CT chest 6/10 >>  Consolidation LUL and LLL, centrilobular emphysema, thick walled air/fluid collection Lt hemithorax 14.8 x 7.3 cm  CT chest 6/13 >> 4.2 cm ascending aorta, Lt chest tube in place with decreased Lt air-fluid collection, dense rounded mass like consolidation LUL, LLL consolidation, centrilobular emphysema  Micro Data:  Lt pleural fluid 5/23 >> Streptococcus constellatus MRSA PCR 6/10 >> negative Blood 6/10 >> Lt pleural fluid 6/11 >> negative  Antimicrobials:  Vancomycin 6/09 >> Zosyn 6/09 >> 6/10 Flagyl 6/10 >> 6/13 Cefepime 6/10 >> 6/16 Linezolid 6/14 >> 6/16  Interim history/subjective:  Breathing okay.  Has noticed pain in his Lt upper molar when he chews.  Objective   Blood pressure (!) 119/56, pulse 77, temperature 97.8 F (36.6 C), temperature source Oral, resp. rate (!) 21, height 6\' 1"  (1.854 m), weight 75.5 kg, SpO2 95 %.        Intake/Output Summary (Last 24 hours) at 10/15/2019 1055 Last data filed at 10/15/2019 0616 Gross per 24 hour  Intake 965.59 ml  Output 850 ml  Net 115.59 ml    Filed Weights   10/09/19 1738  Weight: 75.5 kg    Examination:  General - alert Eyes - pupils reactive ENT - no sinus tenderness, no stridor, poor dentition Cardiac - regular rate/rhythm, no murmur Chest - decreased BS, Lt mid chest area, no wheeze Abdomen - soft, non tender, + bowel sounds Extremities - no cyanosis, clubbing, or edema Skin - no rashes Neuro - normal strength, moves extremities, follows commands Psych - normal mood and behavior   Assessment & Plan:   Recurrent Lt chest empyema with Lt upper and lower lobe pneumonia. - f/u CT chest from 6/13 shows improvement in Lt pleural space - chest tube d/c'ed 6/15 - can transition to oral augmentin to complete 3 week course of ABx from 6/09 - have scheduled outpt pulmonary follow up 8/09 on 10/31/19 - will need chest xray at follow up  Hx of tobacco abuse with changes of centrilobular emphysema on CT chest. - nicotine patch - will need PFTs as outpt when clinically stable - prn albuterol  Poor dentition. - likely source of pneumonia and empyema - advised he will need assessment by dentist as outpt  Aortic root dilation. - will need monitor as outpt  Okay for d/c home from pulmonary standpoint.  Updated pt's mother at bedside.  D/w Dr. 01/01/20.  Signature:  Lowell Guitar, MD [facility] Pulmonary/Critical Care Pager - [phone] 10/15/2019, 10:55 AM

## 2019-10-15 NOTE — Discharge Summary (Signed)
Physician Discharge Summary  Victor Torres UJW:[id number] DOB: 1970-11-08 DOA: 10/08/2019  PCP: Patient, No Pcp Per  Admit date: 10/08/2019 Discharge date: 10/15/2019  Time spent: 40 minutes  Recommendations for Outpatient Follow-up:  1. Follow outpatient CBC/CMP 2. Follow with pulm outpatient - needs 2 weeks additional abx and follow up CXR 3. Follow up with dentist outpatient - poor dentition, likely source of pneumonia and empyema per pulm  4. Demand ischemia here, follow outpatient with PCP/cardiology to consider ischemic evaluation outpatient 5. Follow aortic root dilation outpatient with PCP  Discharge Diagnoses:  Active Problems:   CAP (community acquired pneumonia)   Pressure injury of skin   Discharge Condition: stable  Diet recommendation: heart healthy  Filed Weights   10/09/19 1738  Weight: 75.5 kg    History of present illness:  This is Victor Torres 49 year old male with past medical history of tobacco use, alcohol abuse, recent admission from 5/23-5/29 for cute hypoxic respiratory failure and sepsis secondary to left sided empyema and pneumonia found to be positive for Streptococcus constellatus at which time he had Victor Torres chest tube placed and was treated with IV antibiotics and discharged on p.o. antibiotics however has since had worsening symptoms. He was supposed to follow-up in the office on 6/14 but due to his worsening symptoms once of breath and subjective fevers with productive cough and contacted his PCP who recommended patient be seen either by urgent care or ED and he presented to [facility] yesterday morning. He had Victor Torres chest x-ray which showed marked severity infiltrates within the mid left lung and base and small to moderate-sized left pleural effusion with trace amount of right pleural fluid.Additionally at [facility] patient had noted elevated troponin from 23->106 and cardiology was consulted by ED physician who did not recommend starting on heparin and thought this was due to  demand. Patient was transferred to [facility] for further management.  CT chest without contrast showed recurrent left-sided empyema with development of Victor Torres thick walled air and fluid collection in the left hemithorax measuring approximately 14.8 x 7.3 cm. Between the chest wall and this fluid collection there does not appear to be normal intervening lung as well as small bilateral pleural effusions and mediastinal and hilar adenopathy presumably reactive.  He was admitted and started on broad-spectrum antibiotics with vancomycin/metronidazole/cefepime and PCCM was consulted.  6/11: Transfer to stepdown unit for closer observation and chest tube placed by PCCM with 750 cc purulent material drained immediately  6/14: Vancomycin changed to linezolid due to short supply of vancomycin trough reagent per pharmacy.  6/15: chest tube removed, Transferred to general floor  He was admitted for Victor Torres left sided empyema.  He's been treated with chest tube and antibiotics.  Plan for discharge with antibiotics to complete Victor Torres 3 week course.  Plan for follow outpatient with pulm as well as evaluation by dental due to his poor dentition.  Cultures here were negative, discharged with augmentin.   Hospital Course:  1. 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 Recent Streptococcus constellatus empyema, failed outpatient cefdinir b. Chest tube 6/11-> 6/15 c. CXR 6/16 with interval removal of chest tube, L lung pneumonia - needs f/u to resolution - no pneumothorax d. Day 7 antibiotics, initially with vancomycin/cefepime/metronidazole, currently linezolid and cefepime -> pleural fluid cx with no growth, but gram positive cocci on gram stain - will discharge with augmentin to complete 3 week course e.  Blood cultures with no growth to date x5 f. Incentive spirometry g. PCCM on board h. Needs  CXR outpatient   1. Left chest/axilla pain secondary to chest tube and infection above Victor Torres. [place] with subcutaneous edema without fluid collection b. Significantly improved now that chest tube is out  2. RLQ abdominal pain, resolved  3. Elevated troponin secondary to demand ischemia Victor Torres. unremarkable echo at recent hospitalization b. Cardiology contacted by ED physician who did not recommend heparin and thought this was demand c. Lipid panel with very low HDL <10, not calculated LDL d. Telemetry e. Follow with PCP/cardiology outpatient to consider ischemia eval  4. Constipation Victor Torres. MiraLAX and plan as above  5. Tobacco use Victor Torres. Nicotine patch b. PFTs as outpatient  6. Hypokalemia, replete  7. Hepatomegaly, probably from alcohol, follow outpatient  8. Anemia of chronic disease 1. stable 2. Hold off on transfusion for now  9. Hyponatremia 1. 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 Superficial Sacral pressure ulcer, present on admission 1. Wound care   Procedures:  Chest tube 6/11 - 6/15  Consultations:  PCCM  Discharge Exam: Vitals:   10/15/19 0218 10/15/19 0612  BP: (!) 137/51 (!) 119/56  Pulse: 83 77  Resp: 20 (!) 21  Temp: 98.3 F (36.8 C) 97.8 F (36.6 C)  SpO2: 96% 95%   Mother at bedside Feeling better, eager for discharge Discussed d/c plan and recs  General: No acute distress. Cardiovascular: Heart sounds show Victor Torres regular rate, and rhythm.  Lungs: crackles to L base Abdomen: Soft, nontender, nondistended  Neurological: Alert and oriented 3. Moves all extremities 4 . Cranial nerves II through XII grossly intact. Skin: Warm and dry. No rashes or lesions. Extremities: No clubbing or cyanosis. No edema.   Discharge Instructions   Discharge Instructions    Call MD for:  difficulty breathing, headache or visual disturbances   Complete by: As directed    Call MD for:  extreme fatigue   Complete by: As directed    Call MD  for:  hives   Complete by: As directed    Call MD for:  persistant dizziness or light-headedness   Complete by: As directed    Call MD for:  persistant nausea and vomiting   Complete by: As directed    Call MD for:  redness, tenderness, or signs of infection (pain, swelling, redness, odor or green/yellow discharge around incision site)   Complete by: As directed    Call MD for:  severe uncontrolled pain   Complete by: As directed    Call MD for:  temperature >100.4   Complete by: As directed    Diet - low sodium heart healthy   Complete by: As directed    Discharge instructions   Complete by: As directed    You were seen for pneumonia and infection/pus in the lung.  You've improved with drainage and antibiotics.  Please follow up with pulmonology as an outpatient as scheduled.  You need Kafi Dotter follow up chest x ray and lung function tests outpatient.    We'll send you home with antibiotics for another 2 weeks to complete Maison Agrusa 3 week course.  You'll need to follow up with [place] [facility] dentist outpatient, we think you have bad teeth that are contributing to your lung infections.  Your troponin (cardiac enzyme) was elevated here, we think because of the demand of your acute illness.  Please follow up with Jameyah Fennewald PCP as an outpatient, you can discuss the need for  stress testing or cardiology follow up.  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  Return for new, recurrent, or worsening symptoms.  Please ask your PCP to request records from this hospitalization so they know what was done and what the next steps will be.   Increase activity slowly   Complete by: As directed    No wound care   Complete by: As directed      Allergies as of 10/15/2019   No Known Allergies     Medication List    STOP taking these medications   cefdinir 300 MG capsule Commonly known as: OMNICEF     TAKE these medications   albuterol 108 (90 Base) MCG/ACT inhaler Commonly known  as: VENTOLIN HFA Inhale 1 puff into the lungs every 8 (eight) hours as needed for wheezing or shortness of breath.   amoxicillin-clavulanate 875-125 MG tablet Commonly known as: Augmentin Take 1 tablet by mouth 2 (two) times daily for 14 days.   folic acid 1 MG tablet Commonly known as: FOLVITE Take 1 tablet (1 mg total) by mouth daily.   ibuprofen 400 MG tablet Commonly known as: ADVIL Take 1 tablet (400 mg total) by mouth every 8 (eight) hours as needed for moderate pain.   multivitamin with minerals Tabs tablet Take 1 tablet by mouth daily.   nicotine 21 mg/24hr patch Commonly known as: NICODERM CQ - dosed in mg/24 hours Place 1 patch (21 mg total) onto the skin daily.   pantoprazole 40 MG tablet Commonly known as: PROTONIX Take 1 tablet (40 mg total) by mouth daily.   polyethylene glycol 17 g packet Commonly known as: MIRALAX / GLYCOLAX Take 17 g by mouth daily as needed for mild constipation.   thiamine 100 MG tablet Take 1 tablet (100 mg total) by mouth daily.      No Known Allergies  Follow-up Information    Bevelyn Ngo, NP Follow up on 10/31/2019.   Specialty: Pulmonary Disease Why: Appt at 11 AM.  Please arrive at 10:45 for check in.   Contact information: [address] [place] [address] [phone]                The results of significant diagnostics from this hospitalization (including imaging, microbiology, ancillary and laboratory) are listed below for reference.    Significant Diagnostic Studies: DG Chest 1 View  Result Date: 09/23/2019 CLINICAL DATA:  Hypoxia EXAM: CHEST  1 VIEW COMPARISON:  Sep 22, 2019 chest radiograph and chest CT Sep 21, 2019 FINDINGS: Endotracheal tube tip is 5.9 cm above the carina. Nasogastric tube tip and side port are below the diaphragm. Chest tube is present on the left, stable from 1 day prior. No evident pneumothorax. Ill-defined airspace opacity remains in the left lower lung region with  consolidation medially. Small left pleural effusion evident. Right lung is clear. Heart is mildly enlarged with pulmonary vascularity normal. No adenopathy. No bone lesions. IMPRESSION: Tube and catheter positions as described without pneumothorax. There has been apparent partial drainage of empyema on the left with ill-defined opacity remaining in the left lower lung region with small pleural effusion. Consolidation medial left base present, likely due to combination of atelectasis and suspected pneumonia. Right lung clear.  Stable cardiac prominence. Electronically Signed   By: Bretta Bang III M.D.   On: 09/23/2019 08:38   DG Abd 1 View  Result Date: 10/09/2019 CLINICAL DATA:  Abdominal pain and distention.  EXAM: ABDOMEN - 1 VIEW COMPARISON:  None. FINDINGS: There appears to be Darreon Lutes large left-sided pleural effusion. The liver is enlarged measuring approximately 23 cm craniocaudad. The bowel gas pattern is nonobstructive. IMPRESSION: 1. Nonobstructive bowel gas pattern. 2. Probable large left-sided pleural effusion. 3. Hepatomegaly. Electronically Signed   By: Katherine Mantle M.D.   On: 10/09/2019 20:23   CT CHEST WO CONTRAST  Result Date: 10/12/2019 CLINICAL DATA:  Empyema.  Follow-up chest tube placement. EXAM: CT CHEST WITHOUT CONTRAST TECHNIQUE: Multidetector CT imaging of the chest was performed following the standard protocol without IV contrast. COMPARISON:  CT 3 days ago, additional priors including chest CT 09/26/2019. FINDINGS: Cardiovascular: Aortic atherosclerosis. Static ascending aorta maximal dimension 4.2 cm. No periaortic stranding. Upper normal heart size with coronary artery calcifications. No pericardial effusion. Mediastinum/Nodes: Multiple enlarged lower paratracheal nodes measuring up to 15 mm. There are prominent prevascular nodes. Limited assessment for hilar adenopathy in the absence of IV contrast. There prominent left axillary nodes. No esophageal wall thickening. No  visualized thyroid nodule. Lungs/Pleura: Left chest tube with significant decrease in air-fluid collection in the left hemithorax since prior CT. Small residual collection at the left lung base. Heterogeneous consolidation throughout the left lower lobe with ground-glass and consolidative opacities abutting the fissure. Dense rounded masslike opacity in the periphery of the left upper lobe. Surrounding ground-glass and air bronchograms which extends to the hilum and fissure, tracking towards the superior segment, slight improvement in ground-glass consolidation from prior exam. Additional mild ground-glass and patchy opacities extend to the superior segment of the upper lobe. Underlying emphysema. Trace right pleural effusion with adjacent compressive atelectasis. Upper Abdomen: Ill-defined air in fluid in the left chest wall just superior to the chest tube. Streak artifact from arms down positioning limits detailed assessment. Soft tissue air tracks in the chest wall musculature posteriorly. No definite fluid collection within the abdominal cavity. Suggestion of slight nodular hepatic contours. Musculoskeletal: No bony destruction or periosteal reaction, particularly in region of consolidation. There are no acute or suspicious osseous abnormalities. Degenerative change in the spine. IMPRESSION: 1. Left chest tube with significant decrease in air-fluid collection in the left hemithorax since prior CT. Small residual collection at the left lung base. 2. Heterogeneous consolidation throughout the left lower lobe with ground-glass and consolidative opacities abutting the fissure. Dense rounded masslike opacity in the periphery of the left upper lobe. Surrounding ground-glass and consolidative opacities which extends to the hilum and fissure, slight improvement in consolidation from prior CT. Findings favor complicated pneumonia, however recommend continued follow-up to ensure resolution and exclude underlying neoplasm.  Neoplasm is felt less likely given lack of underlying pulmonary mass on recent chest CT. 3. Trace right pleural effusion with adjacent compressive atelectasis. 4. Mediastinal adenopathy is likely reactive. Aortic Atherosclerosis (ICD10-I70.0) and Emphysema (ICD10-J43.9). Electronically Signed   By: Narda Rutherford M.D.   On: 10/12/2019 15:37   CT CHEST WO CONTRAST  Result Date: 10/09/2019 CLINICAL DATA:  Empyema EXAM: CT CHEST WITHOUT CONTRAST TECHNIQUE: Multidetector CT imaging of the chest was performed following the standard protocol without IV contrast. COMPARISON:  CT angio chest dated Sep 21, 2019 FINDINGS: Cardiovascular: Heart size is normal. Coronary artery calcifications are noted. There is no significant pericardial effusion. There is no evidence for thoracic aortic aneurysm. There are atherosclerotic changes of the thoracic aorta. The intracardiac blood pool is hypodense relative to the adjacent myocardium consistent with anemia. Mediastinum/Nodes: --there are enlarged mediastinal lymph nodes. --there are enlarged hilar lymph nodes. --  No axillary lymphadenopathy. -- No supraclavicular lymphadenopathy. -- Normal thyroid gland where visualized. -  Unremarkable esophagus. Lungs/Pleura: There is extensive consolidation in the left upper lobe which has significantly progressed since the prior study. There is consolidation versus atelectasis at the left lung base, not well evaluated in the absence of IV contrast. Emphysematous changes are noted. There has been development of Jannelle Notaro thick walled air and fluid collection in the left hemithorax measuring approximately 14.8 x 7.3 cm. Between the chest wall and this fluid collection there does not appear to be normal intervening lung, however evaluation is limited by lack of IV contrast. There are small bilateral pleural effusions. Upper Abdomen: Contrast bolus timing is not optimized for evaluation of the abdominal organs. The visualized portions of the organs of  the upper abdomen are normal. Musculoskeletal: No chest wall abnormality. No bony spinal canal stenosis. IMPRESSION: 1. Recurrent left-sided empyema as detailed above. 2. Worsening consolidation in the left upper and left lower lobes. 3. Small bilateral pleural effusions. 4. Mediastinal and hilar adenopathy, presumably reactive. Aortic Atherosclerosis (ICD10-I70.0) and Emphysema (ICD10-J43.9). These results will be called to the ordering clinician or representative by the Radiologist Assistant, and communication documented in the PACS or [employer] Dashboard. Electronically Signed   By: Katherine Mantlehristopher  Green M.D.   On: 10/09/2019 20:30   CT CHEST W CONTRAST  Result Date: 09/26/2019 CLINICAL DATA:  49 year old male with shortness of breath and empyema. EXAM: CT CHEST WITH CONTRAST TECHNIQUE: Multidetector CT imaging of the chest was performed during intravenous contrast administration. CONTRAST:  75mL OMNIPAQUE IOHEXOL 300 MG/ML  SOLN COMPARISON:  Chest CT dated 09/21/2019. FINDINGS: Cardiovascular: There is no cardiomegaly or pericardial effusion. Coronary vascular calcification with severe atherosclerotic calcification of the LAD, advanced for the patient's age. Mild atherosclerotic calcification of the thoracic aorta. The origins of the great vessels of the aortic arch appear patent. The central pulmonary arteries appear unremarkable. Mediastinum/Nodes: There is no hilar or mediastinal adenopathy. The esophagus is grossly unremarkable. No mediastinal fluid collection. Lungs/Pleura: Left pleural pigtail drainage catheter with near complete resolution of the previously seen collection. There is Pearley Baranek small amount of air within the left pleural space, likely introduced by the catheter. Small left and probable trace right pleural effusions. Diffuse bilateral ground-glass nodular opacities most consistent with multifocal pneumonia, clinical correlation is recommended. Patchy area of consolidation in the left lower lobe may  represent atelectasis or pneumonia. There is background of centrilobular emphysema. The central airways are patent. Upper Abdomen: No acute abnormality. Musculoskeletal: Degenerative changes of the spine. No acute osseous pathology. IMPRESSION: 1. Left pleural pigtail drainage catheter with near complete resolution of the previously seen collection. 2. Diffuse bilateral ground-glass nodular opacities most consistent with multifocal pneumonia. Clinical correlation and follow-up to resolution recommended. 3. Small left and probable trace right pleural effusions. 4. Aortic Atherosclerosis (ICD10-I70.0) and Emphysema (ICD10-J43.9). 5. Severe atherosclerotic calcification of the LAD, advanced for patient's age. Electronically Signed   By: Elgie CollardArash  Radparvar M.D.   On: 09/26/2019 20:51   CT Angio Chest PE W and/or Wo Contrast  Result Date: 09/21/2019 CLINICAL DATA:  Shortness of breath. EXAM: CT ANGIOGRAPHY CHEST WITH CONTRAST TECHNIQUE: Multidetector CT imaging of the chest was performed using the standard protocol during bolus administration of intravenous contrast. Multiplanar CT image reconstructions and MIPs were obtained to evaluate the vascular anatomy. CONTRAST:  100mL OMNIPAQUE IOHEXOL 350 MG/ML SOLN COMPARISON:  None. FINDINGS: Cardiovascular: The subsegmental pulmonary arteries are limited in evaluation secondary to suboptimal opacification with intravenous contrast.  This is most notable within the bilateral lung bases. No intraluminal filling defects are clearly identified. Normal heart size. No pericardial effusion. Mediastinum/Nodes: There is mild precarinal lymphadenopathy. Thyroid gland, trachea, and esophagus demonstrate no significant findings. Lungs/Pleura: Tuleen Mandelbaum 14.0 cm x 7.8 cm thick walled (approximately 1.3 cm) cavitary lesion is seen within the anterolateral aspect of the mid and lower left lung. This is increased in size when compared to the prior exam. Ember Gottwald single large air-fluid level is seen. Chrsitopher Wik  marked amount of adjacent posterior atelectasis and/or infiltrate is seen within the right lung base. This is increased in severity when compared to the prior study. Mild, slightly nodular appearing infiltrate is seen within the inferior aspect of the right middle lobe. This represents Rishan Oyama new finding when compared to the prior exam. There is no evidence of Julieth Tugman pleural effusion or pneumothorax. Upper Abdomen: No acute abnormality. Musculoskeletal: No chest wall abnormality. No acute or significant osseous findings. Review of the MIP images confirms the above findings. IMPRESSION: 1. Paulena Servais 14.0 cm x 7.8 cm thick walled (approximately 1.3 cm) cavitary lesion within the anterolateral aspect of the mid and lower left lung, increased in size when compared to the prior exam. This is likely consistent with an evolving empyema. 2. Marked severity posterior right basilar atelectasis and/or infiltrate. 3. Mild, slightly nodular appearing right middle lobe infiltrate. This represents Collyn Ribas new finding when compared to the prior exam. Electronically Signed   By: Aram Candela M.D.   On: 09/21/2019 18:31   DG Chest Port 1 View  Result Date: 10/15/2019 CLINICAL DATA:  Empyema. EXAM: PORTABLE CHEST 1 VIEW COMPARISON:  Chest x-ray from yesterday. FINDINGS: Stable cardiomediastinal silhouette with normal heart size and obscured left heart border. Interval removal of the left chest tube. No pneumothorax. Unchanged consolidation in the left upper and lower lobes. The right lung is clear. No large pleural effusion. No acute osseous abnormality. IMPRESSION: 1. Interval removal of the left chest tube. No pneumothorax. 2. Unchanged left lung pneumonia. Continued follow-up to resolution is recommended. Electronically Signed   By: Obie Dredge M.D.   On: 10/15/2019 08:33   DG Chest Port 1 View  Result Date: 10/14/2019 CLINICAL DATA:  Empyema EXAM: PORTABLE CHEST 1 VIEW COMPARISON:  October 13, 2019 FINDINGS: The heart size and mediastinal  contours are unchanged. Again noted is Kim Lauver loculated moderate left pleural effusion. There is adjacent airspace opacity. The right lung is clear. No acute osseous abnormality. Tilley Faeth left-sided chest tube is seen. IMPRESSION: No active disease. Electronically Signed   By: Jonna Clark M.D.   On: 10/14/2019 05:09   DG CHEST PORT 1 VIEW  Result Date: 10/13/2019 CLINICAL DATA:  Empyema EXAM: PORTABLE CHEST 1 VIEW COMPARISON:  Radiograph 10/12/2019, CT 10/12/2019 FINDINGS: Stable cardiac silhouette. LEFT chest tube in place without pneumothorax. Large peripheral consolidation in the lateral mid LEFT lung again noted. RIGHT lung clear IMPRESSION: 1. No interval change. 2. Large focus of consolidation in LEFT lung with chest tube place. Electronically Signed   By: Genevive Bi M.D.   On: 10/13/2019 07:51   DG CHEST PORT 1 VIEW  Result Date: 10/12/2019 CLINICAL DATA:  Empyema of the lung. EXAM: PORTABLE CHEST 1 VIEW COMPARISON:  10/11/2019; 10/10/2018; 10/09/2019; chest CT-10/09/2019 FINDINGS: Grossly unchanged cardiac silhouette and mediastinal contours with partial obscuration of left heart border secondary to grossly unchanged heterogeneous/consolidative opacity involving the peripheral and basilar aspect of the left mid/lower lung. Stable position of support apparatus. No pneumothorax. Trace left-sided pleural  effusion is not excluded. The right lung remains well aerated. No acute osseous abnormalities. Slight reduction in the amount of left lateral chest wall subcutaneous emphysema. IMPRESSION: 1.  Stable positioning of support apparatus.  No pneumothorax. 2. Similar appearance of heterogeneous/consolidative opacity involving the mid and lower aspect of the left lung compatible provided history of empyema. Electronically Signed   By: Simonne Come M.D.   On: 10/12/2019 04:29   DG Chest Port 1 View  Result Date: 10/11/2019 CLINICAL DATA:  Pneumothorax EXAM: PORTABLE CHEST 1 VIEW COMPARISON:  October 10, 2019  FINDINGS: The left-sided chest tube is stable in positioning. There is Leovardo Thoman persistent opacity overlying the left lower lung zone. The left basilar pneumothorax is not well appreciated on this study. There is no definite left-sided pneumothorax. There is subcutaneous gas along the patient's left flank. The right lung field is stable in appearance. The heart size is stable in appearance. There is Rosalin Buster persistent area of consolidation in the left upper lobe. IMPRESSION: Resolution of the basilar pneumothorax on the left. Otherwise, no significant interval change. Electronically Signed   By: Katherine Mantle M.D.   On: 10/11/2019 00:55   DG Chest Port 1 View  Result Date: 10/10/2019 CLINICAL DATA:  Status post chest tube placement EXAM: PORTABLE CHEST 1 VIEW COMPARISON:  October 09, 2019 FINDINGS: Shadai Mcclane chest tube is been placed on the left with pleural effusion on the left slightly smaller. There is subcutaneous air on the left with questionable minimal pneumothorax in the inferolateral left base. There remains extensive airspace consolidation throughout much of the left lung, most notably in the mid and lower lung zones. There is an ill-defined area of airspace opacity in the right upper lobe which is new from 1 day prior. Right lung otherwise clear. Heart is borderline enlarged with pulmonary vascularity normal. No adenopathy. No bone lesions. IMPRESSION: Chest tube on the left inferiorly with subcutaneous air and questionable tiny left inferolateral pneumothorax. Left pleural effusion smaller. Fairly extensive consolidation throughout the left mid and lower lung zone regions remains with Nneka Blanda lesser degree of consolidation in the left upper lobe. Few small area of infiltrate in the right upper lobe. Stable cardiac silhouette. Electronically Signed   By: Bretta Bang III M.D.   On: 10/10/2019 14:19   DG CHEST PORT 1 VIEW  Result Date: 10/09/2019 CLINICAL DATA:  Decreased right breath sounds, prior abnormal chest  x-ray EXAM: PORTABLE CHEST 1 VIEW COMPARISON:  10/08/2019 FINDINGS: Single frontal view of the chest demonstrates persistent left lung consolidation and effusion. Right chest is clear. No pneumothorax. Cardiac silhouette is stable. No acute bony abnormalities. IMPRESSION: 1. Stable left lung consolidation and pleural effusion, compatible with pneumonia. 2. Right chest is clear. No explanation for decreased breath sounds on physical exam. Electronically Signed   By: Sharlet Salina M.D.   On: 10/09/2019 19:33   DG Chest Port 1 View  Result Date: 10/08/2019 CLINICAL DATA:  Worsening shortness of breath. EXAM: PORTABLE CHEST 1 VIEW COMPARISON:  Sep 25, 2019 FINDINGS: The left-sided chest tube seen on the prior study has been removed. Marked severity infiltrates are seen within the mid left lung and left lung base. Corrie Reder small to moderate size left pleural effusion is noted. Erie Sica trace amount of pleural fluid is seen on the right. No pneumothorax is identified. The heart size and mediastinal contours are within normal limits. The visualized skeletal structures are unremarkable. IMPRESSION: 1. Marked severity infiltrates within the mid left lung and left lung base.  2. Small to moderate size left pleural effusion. 3. Trace amount of right pleural fluid. Electronically Signed   By: Aram Candela M.D.   On: 10/08/2019 23:28   DG Chest Port 1 View  Result Date: 09/25/2019 CLINICAL DATA:  Chest tube placement EXAM: PORTABLE CHEST 1 VIEW COMPARISON:  Sep 23, 2019 FINDINGS: Normal cardiac silhouette. LEFT chest tube in place without pneumothorax. LEFT basilar atelectasis noted. Small LEFT effusion. RIGHT lung clear. IMPRESSION: 1. LEFT chest tube in place.  No pneumothorax. 2.  LEFT basilar atelectasis and effusion. Electronically Signed   By: Genevive Bi M.D.   On: 09/25/2019 08:54   DG CHEST PORT 1 VIEW  Result Date: 09/22/2019 CLINICAL DATA:  Empyema, ET placement, OG tube, left chest tube placement EXAM:  PORTABLE CHEST 1 VIEW COMPARISON:  CT 09/21/2019, radiograph 09/21/2019 FINDINGS: *Endotracheal tube is position in the mid to upper trachea, 7.4 cm from the carina. *Transesophageal tube tip and side port are below the level of imaging, likely beyond the GE junction. *Pigtail pleural drain is seen along the left pleural in the vicinity of the complex empyema seen on comparison cross-sectional imaging. Significant interval decrease in size of the air and fluid containing pleural collection in the left lung with some residual basilar opacity likely reflecting combination of residual pleural disease, consolidation and atelectasis. The right lung is predominantly clear. No right pneumothorax or pleural fluid. Cardiac contours are grossly unchanged accounting for opacity obscuring portion of the left heart border. No acute osseous or soft tissue abnormality. IMPRESSION: Pigtail pleural drain along the left pleural likely within the superior extent of the complex empyema seen on comparison cross-sectional imaging. Significant interval decrease in size of the air and fluid containing pleural collection in the left lung with some residual basilar opacity likely reflecting combination of residual pleural disease, consolidation and atelectasis. Electronically Signed   By: Kreg Shropshire M.D.   On: 09/22/2019 04:46   DG Chest Portable 1 View  Result Date: 09/21/2019 CLINICAL DATA:  Elevated white blood cell count. EXAM: PORTABLE CHEST 1 VIEW COMPARISON:  CT scan Sep 15, 2019 FINDINGS: Montrice Montuori rounded collection of air in the lower left hemithorax is at the site of the suspected empyema seen on the CT scan of the chest from Sep 15, 2019. There appears to be more air in the collection today but there is also fluid. Opacity in the left base is identified. No pneumothorax. The right lung is clear. The cardiomediastinal silhouette is otherwise stable. IMPRESSION: 1. Suspected empyema in the left base with more air but persistent  fluid. Suspected opacity adjacent to the empyema could be pneumonia or atelectasis. CT imaging could better evaluate. Electronically Signed   By: Gerome Sam III M.D   On: 09/21/2019 17:01   ECHOCARDIOGRAM COMPLETE  Result Date: 09/22/2019    ECHOCARDIOGRAM REPORT   Patient Name:   DAEMION MCNIEL Date of Exam: 09/22/2019 Medical Rec #:  [id number]     Height:       73.0 in Accession #:    [id number]    Weight:       180.1 lb Date of Birth:  1971/04/18    BSA:          2.058 m Patient Age:    48 years      BP:           100/53 mmHg Patient Gender: M             HR:  57 bpm. Exam Location:  Inpatient Procedure: 2D Echo Indications:    abnormal ECG  History:        Patient has no prior history of Echocardiogram examinations.                 Risk Factors:Current Smoker. Alcohol abuse.  Sonographer:    Celene Skeen RDCS (AE) Referring Phys: [id number] John Giovanni  Sonographer Comments: Echo performed with patient supine and on artificial respirator. Image acquisition challenging due to respiratory motion. restricted mobility. positioning not ideal IMPRESSIONS  1. Left ventricular ejection fraction, by estimation, is 60 to 65%. The left ventricle has normal function. The left ventricle has no regional wall motion abnormalities. Left ventricular diastolic parameters were normal.  2. Right ventricular systolic function is normal. The right ventricular size is normal.  3. Right atrial size was mildly dilated.  4. The mitral valve is normal in structure. Trivial mitral valve regurgitation.  5. The aortic valve is grossly normal. Aortic valve regurgitation is not visualized. No aortic stenosis is present.  6. Aortic dilatation noted. 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 FINDINGS  Left Ventricle: Left ventricular ejection fraction, by estimation, is 60 to 65%. The left ventricle has normal function. The left ventricle has no regional wall motion abnormalities. The left  ventricular internal cavity size was normal in size. There is  no left ventricular hypertrophy. Left ventricular diastolic parameters were normal. Right Ventricle: The right ventricular size is normal. No increase in right ventricular wall thickness. Right ventricular systolic function is normal. Left Atrium: Left atrial size was normal in size. Right Atrium: Right atrial size was mildly dilated. Pericardium: There is no evidence of pericardial effusion. Mitral Valve: The mitral valve is normal in structure. Trivial mitral valve regurgitation. MV peak gradient, 19.7 mmHg. The mean mitral valve gradient is 7.0 mmHg. Tricuspid Valve: The tricuspid valve is normal in structure. Tricuspid valve regurgitation is not demonstrated. No evidence of tricuspid stenosis. Aortic Valve: The aortic valve is grossly normal. Aortic valve regurgitation is not visualized. Aortic regurgitation PHT measures 426 msec. No aortic stenosis is present. Pulmonic Valve: The pulmonic valve was normal in structure. Pulmonic valve regurgitation is not visualized. No evidence of pulmonic stenosis. Aorta: The aortic root was not well visualized and aortic dilatation noted. 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 IAS/Shunts: The atrial septum is grossly normal.  LEFT VENTRICLE PLAX 2D LVIDd:         5.30 cm  Diastology LVIDs:         3.40 cm  LV e' lateral: 11.00 cm/s LV PW:         1.10 cm  LV e' medial:  6.74 cm/s LV IVS:        1.20 cm LVOT diam:     2.45 cm LV SV:         115 LV SV Index:   56 LVOT Area:     4.71 cm  RIGHT VENTRICLE TAPSE (M-mode): 2.9 cm LEFT ATRIUM             Index       RIGHT ATRIUM           Index LA diam:        3.00 cm 1.46 cm/m  RA Area:     20.80 cm LA Vol (A2C):   41.4 ml 20.12 ml/m RA Volume:   51.70 ml  25.12 ml/m LA Vol (  A4C):   26.0 ml 12.63 ml/m LA Biplane Vol: 32.4 ml 15.74 ml/m  AORTIC VALVE LVOT Vmax:   107.00 cm/s LVOT Vmean:  68.600 cm/s LVOT VTI:    0.243 m AI PHT:       426 msec  AORTA Ao Root diam: 4.00 cm MITRAL VALVE MV Peak grad: 19.7 mmHg  SHUNTS MV Mean grad: 7.0 mmHg   Systemic VTI:  0.24 m MV Vmax:      2.22 m/s   Systemic Diam: 2.45 cm MV Vmean:     122.0 cm/s Kristeen Miss MD Electronically signed by Kristeen Miss MD Signature Date/Time: 09/22/2019/2:41:06 PM    Final    [place] CHEST SOFT TISSUE  Result Date: 10/11/2019 CLINICAL DATA:  Inpatient. Pain and swelling in the left chest wall superior to the chest tube site. EXAM: ULTRASOUND OF HEAD/NECK SOFT TISSUES TECHNIQUE: Ultrasound examination of the head and neck soft tissues was performed in the area of clinical concern. COMPARISON:  Chest radiograph from earlier today. FINDINGS: Mild skin thickening and subcutaneous edema demonstrated surrounding the left chest tube site, with no fluid collection or mass. IMPRESSION: Mild skin thickening and subcutaneous edema demonstrated surrounding the left chest tube site, with no fluid collection or mass. Electronically Signed   By: Delbert Phenix M.D.   On: 10/11/2019 11:45   US Abdomen Limited RUQ  Result Date: 09/22/2019 CLINICAL DATA:  Elevated LFTs EXAM: ULTRASOUND ABDOMEN LIMITED RIGHT UPPER QUADRANT COMPARISON:  None available FINDINGS: Gallbladder: No gallstones or wall thickening visualized. Murphy sign cannot be assessed due to patient condition. Common bile duct: Diameter: 5-6 mm.  Where visualized, no filling defect. Liver: No focal lesion identified. Within normal limits in parenchymal echogenicity. Portal vein is patent on color Doppler imaging with normal direction of blood flow towards the liver. IMPRESSION: Negative right upper quadrant ultrasound Electronically Signed   By: Marnee Spring M.D.   On: 09/22/2019 09:40    Microbiology: Recent Results (from the past 240 hour(s))  Blood culture (routine x 2)     Status: None   Collection Time: 10/08/19 11:56 PM   Specimen: BLOOD  Result Value Ref Range Status   Specimen Description   Final    BLOOD RIGHT  ANTECUBITAL Performed at [facility], [address] [place], [place] [address]    Special Requests   Final    BOTTLES DRAWN AEROBIC AND ANAEROBIC Blood Culture adequate volume Performed at [facility], [address] [place], [place] [address]    Culture   Final    NO GROWTH 5 DAYS Performed at [facility], [address], [place], [place] [address]    Report Status 10/14/2019 FINAL  Final  SARS Coronavirus 2 by RT PCR (hospital order, performed in [facility] hospital lab) Nasopharyngeal Nasopharyngeal Swab     Status: None   Collection Time: 10/09/19 12:08 AM   Specimen: Nasopharyngeal Swab  Result Value Ref Range Status   SARS Coronavirus 2 NEGATIVE NEGATIVE Final    Comment: (NOTE) SARS-CoV-2 target nucleic acids are NOT DETECTED.  The SARS-CoV-2 RNA is generally detectable in upper and lower respiratory specimens during the acute phase of infection. The lowest concentration of SARS-CoV-2 viral copies this assay can detect is 250 copies / mL. Rayman Petrosian negative result does not preclude SARS-CoV-2 infection and should not be used as the sole basis for treatment or other patient management decisions.  Nanda Bittick negative result may occur with improper specimen collection /  handling, submission of specimen other than nasopharyngeal swab, presence of viral mutation(s) within the areas targeted by this assay, and inadequate number of viral copies (<250 copies / mL). Julyanna Scholle negative result must be combined with clinical observations, patient history, and epidemiological information.  Fact Sheet for Patients:   StrictlyIdeas.no  Fact Sheet for Healthcare Providers: BankingDealers.co.za  This test is not yet approved or  cleared by the [place] FDA and has been authorized for detection and/or diagnosis of SARS-CoV-2 by FDA under an Emergency Use Authorization (EUA).  This EUA will remain in effect (meaning this test  can be used) for the duration of the COVID-19 declaration under Section 564(b)(1) of the Act, 21 U.S.C. section 360bbb-3(b)(1), unless the authorization is terminated or revoked sooner.  Performed at [facility], [address] [place], [place] [address]   Blood culture (routine x 2)     Status: None   Collection Time: 10/09/19 12:10 AM   Specimen: BLOOD  Result Value Ref Range Status   Specimen Description   Final    BLOOD LEFT ANTECUBITAL Performed at [facility], [address] [place], [place] [address]    Special Requests   Final    BOTTLES DRAWN AEROBIC AND ANAEROBIC Blood Culture adequate volume Performed at [facility], [address] [place], [place] [address]    Culture   Final    NO GROWTH 5 DAYS Performed at [facility], [place] [address] [place], [place] [address]    Report Status 10/14/2019 FINAL  Final  MRSA PCR Screening     Status: None   Collection Time: 10/09/19  5:06 PM   Specimen: Nasal Mucosa; Nasopharyngeal  Result Value Ref Range Status   MRSA by PCR NEGATIVE NEGATIVE Final    Comment:        The GeneXpert MRSA Assay (FDA approved for NASAL specimens only), is one component of Adyson Vanburen comprehensive MRSA colonization surveillance program. It is not intended to diagnose MRSA infection nor to guide or monitor treatment for MRSA infections. Performed at [facility], [place] [address] [place], [place] [address]   MRSA PCR Screening     Status: None   Collection Time: 10/10/19  3:08 PM   Specimen: Nasal Mucosa; Nasopharyngeal  Result Value Ref Range Status   MRSA by PCR NEGATIVE NEGATIVE Final    Comment:        The GeneXpert MRSA Assay (FDA approved for NASAL specimens only), is one component of Dio Giller comprehensive MRSA colonization surveillance program. It is not intended to diagnose MRSA infection nor to guide or monitor treatment for MRSA infections. Performed at [facility], [place] [address] [place], [place] [address]   Body fluid culture     Status: None   Collection Time: 10/10/19  6:47 PM   Specimen: Pleura; Body Fluid  Result Value Ref Range Status   Specimen Description   Final    PLEURAL Performed at [place] [address] [place], [place] [address]    Special Requests   Final    NONE Performed at [facility], [place] [address] [place], [place] [address]    Gram Stain   Final    ABUNDANT WBC PRESENT, PREDOMINANTLY PMN FEW GRAM POSITIVE COCCI CRITICAL RESULT CALLED TO, READ BACK BY AND VERIFIED WITH: Tyson Babinski RN 10/10/19 2352 JDW    Culture   Final    NO GROWTH  Performed at [facility], [address], [place], [place] [address]    Report Status 10/14/2019 FINAL  Final     Labs: Basic Metabolic Panel: Recent Labs  Lab 10/11/19 0155 10/12/19 0225 10/13/19 0315 10/14/19 0312 10/15/19 0515  NA 129* 131* 132* 131* 132*  K 3.4* 3.5 3.4* 3.3* 3.9  CL 96* 97* 97* 97* 96*  CO2 26 27 25 25 27   GLUCOSE 127* 145* 143* 176* 109*  BUN 8 11 8 10 8   CREATININE 0.53* 0.63 0.59* 0.49* 0.56*  CALCIUM 7.7* 7.7* 7.7* 7.6* 8.0*   Liver Function Tests: Recent Labs  Lab 10/10/19 0814  AST 17  ALT 17  ALKPHOS 94  BILITOT 0.8  PROT 6.1*  ALBUMIN 1.7*   No results for input(s): LIPASE, AMYLASE in the last 168 hours. No results for input(s): AMMONIA in the last 168 hours. CBC: Recent Labs  Lab 10/08/19 2326 10/09/19 1714 10/11/19 0155 10/12/19 0225 10/13/19 0315 10/14/19 0312 10/15/19 0515  WBC 25.2*   < > 18.1* 18.7* 18.8* 22.3* 24.6*  NEUTROABS 21.6*  --   --   --   --   --   --   HGB 10.7*   < > 8.2* 8.2* 8.9* 8.9* 9.3*  HCT 32.1*   < > 25.5* 24.9* 26.5* 27.6* 28.9*  MCV 94.1   < > 93.8 93.3 94.3 94.8 95.1  PLT 372   < > 334 376 424* 484* 579*   < > = values in this interval not displayed.   Cardiac Enzymes: No results for input(s): CKTOTAL, CKMB, CKMBINDEX, TROPONINI in  the last 168 hours. BNP: BNP (last 3 results) No results for input(s): BNP in the last 8760 hours.  ProBNP (last 3 results) No results for input(s): PROBNP in the last 8760 hours.  CBG: Recent Labs  Lab 10/15/19 0014  GLUCAP 165*       Signed:  Lacretia Nicks MD.  [facility] 10/15/2019, 11:23 AM

## 2019-10-15 NOTE — TOC Transition Note (Signed)
Transition of Care [facility]) - CM/SW Discharge Note   Patient Details  Name: Victor Torres MRN: [id number] Date of Birth: 12/13/1970  Transition of Care [facility] CM/SW Contact:  Lanier Clam, RN Phone Number: 10/15/2019, 11:25 AM   Clinical Narrative:Provided patient w/pcp listing,also encouraged he can contact his customer 1800 tel# for pcp in netwrok w/insurance. Patient voiced understanding. No further CM needs.         Barriers to Discharge: No Barriers Identified   Patient Goals and CMS Choice Patient states their goals for this hospitalization and ongoing recovery are:: to go back home CMS Medicare.gov Compare Post Acute Care list provided to:: Patient    Discharge Placement                       Discharge Plan and Services   Discharge Planning Services: CM Consult                                 Social Determinants of Health (SDOH) Interventions     Readmission Risk Interventions No flowsheet data found.

## 2019-10-31 ENCOUNTER — Other Ambulatory Visit: Payer: Self-pay

## 2019-10-31 ENCOUNTER — Ambulatory Visit (INDEPENDENT_AMBULATORY_CARE_PROVIDER_SITE_OTHER): Payer: 59 | Admitting: Acute Care

## 2019-10-31 ENCOUNTER — Encounter: Payer: Self-pay | Admitting: Acute Care

## 2019-10-31 ENCOUNTER — Ambulatory Visit (INDEPENDENT_AMBULATORY_CARE_PROVIDER_SITE_OTHER): Payer: 59

## 2019-10-31 ENCOUNTER — Encounter: Payer: Self-pay | Admitting: *Deleted

## 2019-10-31 VITALS — BP 136/64 | HR 93 | Temp 97.9°F | Ht 73.0 in | Wt 162.4 lb

## 2019-10-31 DIAGNOSIS — J441 Chronic obstructive pulmonary disease with (acute) exacerbation: Secondary | ICD-10-CM

## 2019-10-31 DIAGNOSIS — Z720 Tobacco use: Secondary | ICD-10-CM

## 2019-10-31 DIAGNOSIS — F17210 Nicotine dependence, cigarettes, uncomplicated: Secondary | ICD-10-CM | POA: Diagnosis not present

## 2019-10-31 DIAGNOSIS — J189 Pneumonia, unspecified organism: Secondary | ICD-10-CM | POA: Diagnosis not present

## 2019-10-31 DIAGNOSIS — R0602 Shortness of breath: Secondary | ICD-10-CM

## 2019-10-31 DIAGNOSIS — E440 Moderate protein-calorie malnutrition: Secondary | ICD-10-CM

## 2019-10-31 DIAGNOSIS — J9601 Acute respiratory failure with hypoxia: Secondary | ICD-10-CM | POA: Diagnosis not present

## 2019-10-31 DIAGNOSIS — R5381 Other malaise: Secondary | ICD-10-CM

## 2019-10-31 DIAGNOSIS — K089 Disorder of teeth and supporting structures, unspecified: Secondary | ICD-10-CM

## 2019-10-31 MED ORDER — ALBUTEROL SULFATE HFA 108 (90 BASE) MCG/ACT IN AERS: 2.0000 | INHALATION_SPRAY | RESPIRATORY_TRACT | 5 refills | Status: DC | PRN

## 2019-10-31 MED ORDER — ANORO ELLIPTA 62.5-25 MCG/INH IN AEPB: 1.0000 | INHALATION_SPRAY | Freq: Every day | RESPIRATORY_TRACT | 0 refills | Status: DC

## 2019-10-31 NOTE — Progress Notes (Signed)
Reviewed and agree with assessment/plan.   Coralyn Helling, MD [facility] Pulmonary/Critical Care 10/31/2019, 9:30 PM Pager:  [phone]

## 2019-10-31 NOTE — Progress Notes (Signed)
History of Present Illness Victor Torres is a 49 y.o. male current every day smoker  with history of ETOH abuse. He was seen a an inpatient for left lung pneumonia with empyema. by Dr. Craige Cotta .  Synopsis 49 yo male smoker with hx of ETOH admitted from 09/21/19 to 09/27/19 with Lt lung pneumonia with empyema.  Had chest tube placed on 5/24 and removed on 5/29.  Pleural fluid culture from 5/23 grew Streptococcus constellatus.  He was to continue ABx for total course of 21 days, and d/c home with omnicef.  He returned to ER on 10/08/19 with fever, productive cough and dyspnea.  Found to have recurrent Lt pleural space empyema and had chest tube placed again on 10/10/19. He was discharged on 10/15/2019 with 2 weeks of Augmentin . He is using his Proventil rescue about 3 times a day.  Deconditioned from prolonged pneumonia     Recent Hospitalizations 10/08/2019-10/15/2019 CAP and Pressure Injury of the skin  09/21/2019-09/27/2019 Acute Respiratory Failure with Hypoxia, Emphysema, Chest pain, Hyponatremia, Malnutrition, ETOH abuse Tobacco Abuse   CT chest 6/10 >>  Consolidation LUL and LLL, centrilobular emphysema, thick walled air/fluid collection Lt hemithorax 14.8 x 7.3 cm  CT chest 6/13 >> 4.2 cm ascending aorta, Lt chest tube in place with decreased Lt air-fluid collection, dense rounded mass like consolidation LUL, LLL consolidation, centrilobular emphysema  Micro Data:  Lt pleural fluid 5/23 >> Streptococcus constellatus MRSA PCR 6/10 >> negative Blood 6/10 >> Lt pleural fluid 6/11 >> negative  Antimicrobials:  Vancomycin 6/09 >> Zosyn 6/09 >> 6/10 Flagyl 6/10 >> 6/13 Cefepime 6/10 >> 6/16 Linezolid 6/14 >> 6/16  10/31/2019 Hospital Follow Up Pt. Presents for hospital follow up. He was seen by the pulmonary consult service for  for  Pneumonia progressing to Empyema which required chest tube placement x 2 ( Both admissions)  to drain  Since discharge pt. States his appetite better. He hs  lost a total of 30 pounds between the  2 hospitalizations.He is very physically deconditioned.  He was discharged on Augmentin BID x 14 days. He has almost completed treatment.   He states he continues to have a lot of pain to his left lower ribs, and his lower sternum.. Chest tube site in left chest is healing. There is slight redness around the insertion site , but the patient states he has had no drainage from the site for several days. He states he is  breathing better>> no wheezing.  He is still coughing, but his cough is non-productive. He does get short of breath with exertion. He has resumed smoking about 14 cigarettes a day. We discussed that he has got to quit smoking before it kills him. He states he knows he needs to quit. He states he has quit drinking, has had no significant ETOH in about a year. However he then told me he bought a 12 pack the other day, and didn't drink more than one.   He denies any fever. He feels he is gaining his strength back. He has not been seen by his PCP, as he does not have one. We will refer to Primary care to establish him with a PCP. .  .  He still needs cards referral  for elevated troponins related to suspected demand ischemia. demand ischemia. We will place this referral as the patient does not have a PCP, and this will allow for more timely follow up.     Test Results: CXR 10/31/2019: Decreased size  of loculated left hydropneumothorax/empyema since prior study. Decreased left mid and lower lung airspace disease. Increased tiny right pleural effusion. COPD.  Echo 09/22/2019 Left ventricular ejection fraction, by estimation, is 60 to 65%. The left ventricle has normal function. The left ventricle has no regional wall motion abnormalities. Left ventricular diastolic parameters were normal. Right ventricular systolic function is normal. The right ventricular size is normal. Right atrial size was mildly dilated. The mitral valve is normal in  structure. Trivial mitral valve regurgitation. The aortic valve is grossly normal. Aortic valve regurgitation is not visualized. No aortic stenosis is present. Aortic dilatation noted. There is mild to moderate dilatation of the aortic root measuring 40 mm  CBC Latest Ref Rng & Units 10/15/2019 10/14/2019 10/13/2019  WBC 4.0 - 10.5 K/uL 24.6(H) 22.3(H) 18.8(H)  Hemoglobin 13.0 - 17.0 g/dL 1.6(X9.3(L) 8.9(L) 8.9(L)  Hematocrit 39 - 52 % 28.9(L) 27.6(L) 26.5(L)  Platelets 150 - 400 K/uL 579(H) 484(H) 424(H)    BMP Latest Ref Rng & Units 10/15/2019 10/14/2019 10/13/2019  Glucose 70 - 99 mg/dL 096(E109(H) 454(U176(H) 981(X143(H)  BUN 6 - 20 mg/dL 8 10 8   Creatinine 0.61 - 1.24 mg/dL 9.14(N0.56(L) 8.29(F0.49(L) 6.21(H0.59(L)  Sodium 135 - 145 mmol/L 132(L) 131(L) 132(L)  Potassium 3.5 - 5.1 mmol/L 3.9 3.3(L) 3.4(L)  Chloride 98 - 111 mmol/L 96(L) 97(L) 97(L)  CO2 22 - 32 mmol/L 27 25 25   Calcium 8.9 - 10.3 mg/dL 8.0(L) 7.6(L) 7.7(L)    BNP No results found for: BNP  ProBNP No results found for: PROBNP  PFT No results found for: FEV1PRE, FEV1POST, FVCPRE, FVCPOST, TLC, DLCOUNC, PREFEV1FVCRT, PSTFEV1FVCRT  DG Abd 1 View  Result Date: 10/09/2019 CLINICAL DATA:  Abdominal pain and distention. EXAM: ABDOMEN - 1 VIEW COMPARISON:  None. FINDINGS: There appears to be a large left-sided pleural effusion. The liver is enlarged measuring approximately 23 cm craniocaudad. The bowel gas pattern is nonobstructive. IMPRESSION: 1. Nonobstructive bowel gas pattern. 2. Probable large left-sided pleural effusion. 3. Hepatomegaly. Electronically Signed   By: Katherine Mantlehristopher  Green M.D.   On: 10/09/2019 20:23   CT CHEST WO CONTRAST  Result Date: 10/12/2019 CLINICAL DATA:  Empyema.  Follow-up chest tube placement. EXAM: CT CHEST WITHOUT CONTRAST TECHNIQUE: Multidetector CT imaging of the chest was performed following the standard protocol without IV contrast. COMPARISON:  CT 3 days ago, additional priors including chest CT 09/26/2019. FINDINGS:  Cardiovascular: Aortic atherosclerosis. Static ascending aorta maximal dimension 4.2 cm. No periaortic stranding. Upper normal heart size with coronary artery calcifications. No pericardial effusion. Mediastinum/Nodes: Multiple enlarged lower paratracheal nodes measuring up to 15 mm. There are prominent prevascular nodes. Limited assessment for hilar adenopathy in the absence of IV contrast. There prominent left axillary nodes. No esophageal wall thickening. No visualized thyroid nodule. Lungs/Pleura: Left chest tube with significant decrease in air-fluid collection in the left hemithorax since prior CT. Small residual collection at the left lung base. Heterogeneous consolidation throughout the left lower lobe with ground-glass and consolidative opacities abutting the fissure. Dense rounded masslike opacity in the periphery of the left upper lobe. Surrounding ground-glass and air bronchograms which extends to the hilum and fissure, tracking towards the superior segment, slight improvement in ground-glass consolidation from prior exam. Additional mild ground-glass and patchy opacities extend to the superior segment of the upper lobe. Underlying emphysema. Trace right pleural effusion with adjacent compressive atelectasis. Upper Abdomen: Ill-defined air in fluid in the left chest wall just superior to the chest tube. Streak artifact from arms down positioning limits detailed  assessment. Soft tissue air tracks in the chest wall musculature posteriorly. No definite fluid collection within the abdominal cavity. Suggestion of slight nodular hepatic contours. Musculoskeletal: No bony destruction or periosteal reaction, particularly in region of consolidation. There are no acute or suspicious osseous abnormalities. Degenerative change in the spine. IMPRESSION: 1. Left chest tube with significant decrease in air-fluid collection in the left hemithorax since prior CT. Small residual collection at the left lung base. 2.  Heterogeneous consolidation throughout the left lower lobe with ground-glass and consolidative opacities abutting the fissure. Dense rounded masslike opacity in the periphery of the left upper lobe. Surrounding ground-glass and consolidative opacities which extends to the hilum and fissure, slight improvement in consolidation from prior CT. Findings favor complicated pneumonia, however recommend continued follow-up to ensure resolution and exclude underlying neoplasm. Neoplasm is felt less likely given lack of underlying pulmonary mass on recent chest CT. 3. Trace right pleural effusion with adjacent compressive atelectasis. 4. Mediastinal adenopathy is likely reactive. Aortic Atherosclerosis (ICD10-I70.0) and Emphysema (ICD10-J43.9). Electronically Signed   By: Narda Rutherford M.D.   On: 10/12/2019 15:37   CT CHEST WO CONTRAST  Result Date: 10/09/2019 CLINICAL DATA:  Empyema EXAM: CT CHEST WITHOUT CONTRAST TECHNIQUE: Multidetector CT imaging of the chest was performed following the standard protocol without IV contrast. COMPARISON:  CT angio chest dated Sep 21, 2019 FINDINGS: Cardiovascular: Heart size is normal. Coronary artery calcifications are noted. There is no significant pericardial effusion. There is no evidence for thoracic aortic aneurysm. There are atherosclerotic changes of the thoracic aorta. The intracardiac blood pool is hypodense relative to the adjacent myocardium consistent with anemia. Mediastinum/Nodes: --there are enlarged mediastinal lymph nodes. --there are enlarged hilar lymph nodes. -- No axillary lymphadenopathy. -- No supraclavicular lymphadenopathy. -- Normal thyroid gland where visualized. -  Unremarkable esophagus. Lungs/Pleura: There is extensive consolidation in the left upper lobe which has significantly progressed since the prior study. There is consolidation versus atelectasis at the left lung base, not well evaluated in the absence of IV contrast. Emphysematous changes are  noted. There has been development of a thick walled air and fluid collection in the left hemithorax measuring approximately 14.8 x 7.3 cm. Between the chest wall and this fluid collection there does not appear to be normal intervening lung, however evaluation is limited by lack of IV contrast. There are small bilateral pleural effusions. Upper Abdomen: Contrast bolus timing is not optimized for evaluation of the abdominal organs. The visualized portions of the organs of the upper abdomen are normal. Musculoskeletal: No chest wall abnormality. No bony spinal canal stenosis. IMPRESSION: 1. Recurrent left-sided empyema as detailed above. 2. Worsening consolidation in the left upper and left lower lobes. 3. Small bilateral pleural effusions. 4. Mediastinal and hilar adenopathy, presumably reactive. Aortic Atherosclerosis (ICD10-I70.0) and Emphysema (ICD10-J43.9). These results will be called to the ordering clinician or representative by the Radiologist Assistant, and communication documented in the PACS or [employer]. Electronically Signed   By: Katherine Mantle M.D.   On: 10/09/2019 20:30   DG Chest Port 1 View  Result Date: 10/15/2019 CLINICAL DATA:  Empyema. EXAM: PORTABLE CHEST 1 VIEW COMPARISON:  Chest x-ray from yesterday. FINDINGS: Stable cardiomediastinal silhouette with normal heart size and obscured left heart border. Interval removal of the left chest tube. No pneumothorax. Unchanged consolidation in the left upper and lower lobes. The right lung is clear. No large pleural effusion. No acute osseous abnormality. IMPRESSION: 1. Interval removal of the left chest tube. No pneumothorax. 2.  Unchanged left lung pneumonia. Continued follow-up to resolution is recommended. Electronically Signed   By: Obie Dredge M.D.   On: 10/15/2019 08:33   DG Chest Port 1 View  Result Date: 10/14/2019 CLINICAL DATA:  Empyema EXAM: PORTABLE CHEST 1 VIEW COMPARISON:  October 13, 2019 FINDINGS: The heart size and  mediastinal contours are unchanged. Again noted is a loculated moderate left pleural effusion. There is adjacent airspace opacity. The right lung is clear. No acute osseous abnormality. A left-sided chest tube is seen. IMPRESSION: No active disease. Electronically Signed   By: Jonna Clark M.D.   On: 10/14/2019 05:09   DG CHEST PORT 1 VIEW  Result Date: 10/13/2019 CLINICAL DATA:  Empyema EXAM: PORTABLE CHEST 1 VIEW COMPARISON:  Radiograph 10/12/2019, CT 10/12/2019 FINDINGS: Stable cardiac silhouette. LEFT chest tube in place without pneumothorax. Large peripheral consolidation in the lateral mid LEFT lung again noted. RIGHT lung clear IMPRESSION: 1. No interval change. 2. Large focus of consolidation in LEFT lung with chest tube place. Electronically Signed   By: Genevive Bi M.D.   On: 10/13/2019 07:51   DG CHEST PORT 1 VIEW  Result Date: 10/12/2019 CLINICAL DATA:  Empyema of the lung. EXAM: PORTABLE CHEST 1 VIEW COMPARISON:  10/11/2019; 10/10/2018; 10/09/2019; chest CT-10/09/2019 FINDINGS: Grossly unchanged cardiac silhouette and mediastinal contours with partial obscuration of left heart border secondary to grossly unchanged heterogeneous/consolidative opacity involving the peripheral and basilar aspect of the left mid/lower lung. Stable position of support apparatus. No pneumothorax. Trace left-sided pleural effusion is not excluded. The right lung remains well aerated. No acute osseous abnormalities. Slight reduction in the amount of left lateral chest wall subcutaneous emphysema. IMPRESSION: 1.  Stable positioning of support apparatus.  No pneumothorax. 2. Similar appearance of heterogeneous/consolidative opacity involving the mid and lower aspect of the left lung compatible provided history of empyema. Electronically Signed   By: Simonne Come M.D.   On: 10/12/2019 04:29   DG Chest Port 1 View  Result Date: 10/11/2019 CLINICAL DATA:  Pneumothorax EXAM: PORTABLE CHEST 1 VIEW COMPARISON:  October 10, 2019 FINDINGS: The left-sided chest tube is stable in positioning. There is a persistent opacity overlying the left lower lung zone. The left basilar pneumothorax is not well appreciated on this study. There is no definite left-sided pneumothorax. There is subcutaneous gas along the patient's left flank. The right lung field is stable in appearance. The heart size is stable in appearance. There is a persistent area of consolidation in the left upper lobe. IMPRESSION: Resolution of the basilar pneumothorax on the left. Otherwise, no significant interval change. Electronically Signed   By: Katherine Mantle M.D.   On: 10/11/2019 00:55   DG Chest Port 1 View  Result Date: 10/10/2019 CLINICAL DATA:  Status post chest tube placement EXAM: PORTABLE CHEST 1 VIEW COMPARISON:  October 09, 2019 FINDINGS: A chest tube is been placed on the left with pleural effusion on the left slightly smaller. There is subcutaneous air on the left with questionable minimal pneumothorax in the inferolateral left base. There remains extensive airspace consolidation throughout much of the left lung, most notably in the mid and lower lung zones. There is an ill-defined area of airspace opacity in the right upper lobe which is new from 1 day prior. Right lung otherwise clear. Heart is borderline enlarged with pulmonary vascularity normal. No adenopathy. No bone lesions. IMPRESSION: Chest tube on the left inferiorly with subcutaneous air and questionable tiny left inferolateral pneumothorax. Left pleural effusion smaller. Fairly  extensive consolidation throughout the left mid and lower lung zone regions remains with a lesser degree of consolidation in the left upper lobe. Few small area of infiltrate in the right upper lobe. Stable cardiac silhouette. Electronically Signed   By: Bretta Bang III M.D.   On: 10/10/2019 14:19   DG CHEST PORT 1 VIEW  Result Date: 10/09/2019 CLINICAL DATA:  Decreased right breath sounds, prior abnormal chest  x-ray EXAM: PORTABLE CHEST 1 VIEW COMPARISON:  10/08/2019 FINDINGS: Single frontal view of the chest demonstrates persistent left lung consolidation and effusion. Right chest is clear. No pneumothorax. Cardiac silhouette is stable. No acute bony abnormalities. IMPRESSION: 1. Stable left lung consolidation and pleural effusion, compatible with pneumonia. 2. Right chest is clear. No explanation for decreased breath sounds on physical exam. Electronically Signed   By: Sharlet Salina M.D.   On: 10/09/2019 19:33   DG Chest Port 1 View  Result Date: 10/08/2019 CLINICAL DATA:  Worsening shortness of breath. EXAM: PORTABLE CHEST 1 VIEW COMPARISON:  Sep 25, 2019 FINDINGS: The left-sided chest tube seen on the prior study has been removed. Marked severity infiltrates are seen within the mid left lung and left lung base. A small to moderate size left pleural effusion is noted. A trace amount of pleural fluid is seen on the right. No pneumothorax is identified. The heart size and mediastinal contours are within normal limits. The visualized skeletal structures are unremarkable. IMPRESSION: 1. Marked severity infiltrates within the mid left lung and left lung base. 2. Small to moderate size left pleural effusion. 3. Trace amount of right pleural fluid. Electronically Signed   By: Aram Candela M.D.   On: 10/08/2019 23:28   [place] CHEST SOFT TISSUE  Result Date: 10/11/2019 CLINICAL DATA:  Inpatient. Pain and swelling in the left chest wall superior to the chest tube site. EXAM: ULTRASOUND OF HEAD/NECK SOFT TISSUES TECHNIQUE: Ultrasound examination of the head and neck soft tissues was performed in the area of clinical concern. COMPARISON:  Chest radiograph from earlier today. FINDINGS: Mild skin thickening and subcutaneous edema demonstrated surrounding the left chest tube site, with no fluid collection or mass. IMPRESSION: Mild skin thickening and subcutaneous edema demonstrated surrounding the left chest tube site, with  no fluid collection or mass. Electronically Signed   By: Delbert Phenix M.D.   On: 10/11/2019 11:45     Past medical hx Past Medical History:  Diagnosis Date  . Alcohol abuse      Social History   Tobacco Use  . Smoking status: Current Every Day Smoker    Packs/day: 1.50    Types: Cigarettes    Start date: 58  . Smokeless tobacco: Never Used  . Tobacco comment: currently smoking 14cigs per day as of 7/2  Vaping Use  . Vaping Use: Never used  Substance Use Topics  . Alcohol use: Not Currently  . Drug use: Yes    Types: Methamphetamines    Mr.Pereira reports that he has been smoking cigarettes. He started smoking about 35 years ago. He has been smoking about 1.50 packs per day. He has never used smokeless tobacco. He reports previous alcohol use. He reports current drug use. Drug: Methamphetamines.  Tobacco Cessation:Current Every Day Smoker  Past surgical hx, Family hx, Social hx all reviewed.  Current Outpatient Medications on File Prior to Visit  Medication Sig  . albuterol (VENTOLIN HFA) 108 (90 Base) MCG/ACT inhaler Inhale 1 puff into the lungs every 8 (eight) hours as needed for wheezing or  shortness of breath.  [name] amoxicillin-clavulanate (AUGMENTIN) 875-125 MG tablet Take 1 tablet by mouth 2 (two) times daily.  . folic acid (FOLVITE) 1 MG tablet Take 1 tablet (1 mg total) by mouth daily.  [name] ibuprofen (ADVIL) 400 MG tablet Take 1 tablet (400 mg total) by mouth every 8 (eight) hours as needed for moderate pain.  . Multiple Vitamin (MULTIVITAMIN WITH MINERALS) TABS tablet Take 1 tablet by mouth daily.  . pantoprazole (PROTONIX) 40 MG tablet Take 1 tablet (40 mg total) by mouth daily.   No current facility-administered medications on file prior to visit.     No Known Allergies  Review Of Systems:  Constitutional:   No  weight loss, night sweats,  Fevers, chills,+  fatigue, or  lassitude.  HEENT:   No headaches,  Difficulty swallowing,  Tooth/dental problems, or  Sore  throat,                No sneezing, itching, ear ache, nasal congestion, post nasal drip,   CV:  + chest pain,  No Orthopnea, PND, swelling in lower extremities, anasarca, dizziness, palpitations, syncope.   GI  No heartburn, indigestion, abdominal pain, nausea, vomiting, diarrhea, change in bowel habits, loss of appetite, bloody stools.   Resp: + shortness of breath with exertion not  at rest.  No excess mucus, no productive cough,  No non-productive cough,  No coughing up of blood.  No change in color of mucus.  No wheezing.  No chest wall deformity  Skin: no rash or lesions.  GU: no dysuria, change in color of urine, no urgency or frequency.  No flank pain, no hematuria   MS:  No joint pain or swelling.  + decreased range of motion.  No back pain.  Psych:  No change in mood or affect. No depression or anxiety.  No memory loss.   Vital Signs BP 136/64 (BP Location: Left Arm, Cuff Size: Normal)   Pulse 93   Temp 97.9 F (36.6 C) (Oral)   Ht  (1.854 m)   Wt 162 lb 6.4 oz (73.7 kg)   SpO2 97%   BMI 21.43 kg/m    Physical Exam:  General- No distress,  A&Ox3, pleasant, deconditioned male  ENT: No sinus tenderness, TM clear, pale nasal mucosa, no oral exudate,no post nasal drip, no LAN Cardiac: S1, S2, regular rate and rhythm, no murmur Chest: No wheeze/ rales/ dullness; no accessory muscle use, no nasal flaring, no sternal retractions, L Chest tube  insertion site is healing, no drainage. Abd.: Soft Non-tender, ND, BS +, Body mass index is 21.43 kg/m. Ext: No clubbing cyanosis, edema Neuro:Physically deconditioned, cranial nerves intact Skin: No rashes, no lesions, Left chest tube site healing, warm and dry Psych: normal mood and behavior   Assessment/Plan  Recurrent Lt chest empyema with Lt upper and lower lobe pneumonia. CXR 10/31/2019 with Decreased size of loculated left hydropneumothorax/empyema  Decreased left mid and lower lung airspace disease. Increased tiny  right pleural effusion. Suspected COPD.>> Over using rescue inhaler Plan  - chest tube d/c'ed 6/15, site healing no drainage - Has completed  oral augmentin to complete 3 week course of ABx from 6/09 - CXR done today, as described above - Aggressive pulmonary Toilet - Use your Incentive Spirometer with each commercial when watching TV - We will do a  a therapeutic trial of Anoro - This is a maintenance inhaler for COPD.  - Use this one puff once daily. - Rinse mouth after use. -  We will refill your rescue inhaler - Do not use more than 4 times a day, must wait 4 hours in between.  - Use your rescue inhaler as needed  for breakthrough shortness of breath    - Follow up OV in 2 weeks with Maralyn Sago NP, with CXR prior.  Hx of tobacco abuse with changes of centrilobular emphysema on CT chest. Has resumed smoking 14 cigarettes daily since discharge He is over using his rescue inhaler Plan - will need PFTs as outpt when clinically stable and evaluate for treatment of suspected COPD - prn albuterol - Will add Anoro once daily to see if he can reduce his rescue inhaler use ( No ICS until full resolution of empyema.) - We will refer for Lung Cancer Screening , first scan once he is recovered from his pneumonia/ Empyema  Physical Deconditioning Plan Referral to Pulmonary Rehab( place order under Dr. Craige Cotta) Continue to slowly advance your activity  Weight loss of 30 pounds in last 6 weeks with 2 hospitalizations States he has a good appetite. Plan Add Boost/ Ensure once daily as supplement Weight checks at follow up  Poor dentition. - likely source of pneumonia and empyema - advised again  he will need assessment by dentist  Now he is discharged  Aortic root dilation. No PCP - will need monitor as outpt - We have referred to Primary care this visit  Suspected Demand Ischemia as etiology for  Elevated Troponin's as inpatient Plan  - We will refer to cardiology for eval  Bevelyn Ngo, NP 10/31/2019  11:14 AM

## 2019-10-31 NOTE — Patient Instructions (Addendum)
It is good to see you today. We will refer you to cardiology to evaluate you for demand ischemia. CXR now>> Stat Your CXR is improving Continue aggressive pulmonary toilet Use your Incentive Spirometer with each commercial when watching TV  We will schedule you for PFT's once you are better.  We will refill your Proventil inhaler.  This is a rescue inhaler. Use as needed for shortness of breath or wheezing. Do not use more than 4 times a day, must wait 4 hours in between.  We will refer you to Pulmonary rehab ( [facility] [place] or [place]) We will give you a therapeutic trial of Anoro This is a maintenance inhaler.  Use this one puff once daily. Rinse mouth after use.  Follow up in 2 weeks with CXR prior.  We will refer you to a Primary Care. Please contact office for sooner follow up if symptoms do not improve or worsen or seek emergency care

## 2019-11-04 ENCOUNTER — Telehealth: Payer: Self-pay | Admitting: Acute Care

## 2019-11-04 ENCOUNTER — Telehealth (HOSPITAL_COMMUNITY): Payer: Self-pay

## 2019-11-04 DIAGNOSIS — K089 Disorder of teeth and supporting structures, unspecified: Secondary | ICD-10-CM

## 2019-11-04 NOTE — Telephone Encounter (Signed)
Pt insurance is active and benefits verified through [place] [employer], DED $5,500/$5,500 met, out of pocket $6,450/$5,535 met, co-insurance 0%. no pre-authorization required. Passport, [place] 11/04/2019'@9' :30am, REF# [id number]

## 2019-11-04 NOTE — Telephone Encounter (Signed)
Called pt to see if he was interested in the pulmonary rehab. Pt stated he is not interested. Closed referral

## 2019-11-04 NOTE — Telephone Encounter (Signed)
Staff message sent by Sarah: Bevelyn Ngo, NP  Dosha Broshears, Farley Ly, CMA Irving Burton, how do we refer to dentists?  Thanks.    Order placed for pt to be referred to a new dentist. Nothing further needed.

## 2019-11-05 ENCOUNTER — Telehealth: Payer: Self-pay | Admitting: Acute Care

## 2019-11-05 NOTE — Telephone Encounter (Signed)
Patrice, please advise if you have seen forms.

## 2019-11-06 NOTE — Progress Notes (Signed)
These results were discussed with the patient at the time of the OV. He verbalized understanding and had no further questions

## 2019-11-13 NOTE — Telephone Encounter (Signed)
Patrice, did you ever receive any forms on him?

## 2019-11-17 NOTE — Telephone Encounter (Signed)
Rec'd disability forms to be completed via interoffice mail from [employer] - fwd to Maralyn Sago for her to complete -pr

## 2019-11-18 NOTE — Telephone Encounter (Signed)
Rec'd completed forms - sent to [employer] via interoffice mail -pr  °

## 2019-12-09 ENCOUNTER — Other Ambulatory Visit: Payer: Self-pay | Admitting: Acute Care

## 2019-12-10 ENCOUNTER — Ambulatory Visit: Payer: 59 | Admitting: Acute Care

## 2019-12-10 NOTE — Progress Notes (Deleted)
History of Present Illness Victor Torres is a 49 y.o. male male current every day smoker  with history of ETOH abuse. He was seen as an inpatient for left lung pneumonia with empyema. by Dr. Craige Cotta .  Synopsis 49 yo male smoker with hx of ETOH admitted from 09/21/19 to 09/27/19 with Lt lung pneumonia with empyema. Had chest tube placed on 5/24 and removed on 5/29. Pleural fluid culture from 5/23 grew Streptococcus constellatus. He was to continue ABx for total course of 21 days, and d/c home with omnicef. He returned to ER on 10/08/19 with fever, productive cough and dyspnea. Found to have recurrent Lt pleural space empyema and had chest tube placed again on 10/10/19. He was discharged on 10/15/2019 with 2 weeks of Augmentin . He is using his Proventil rescue about 3 times a day.  Deconditioned from prolonged pneumonia     Recent Hospitalizations 10/08/2019-10/15/2019 CAP and Pressure Injury of the skin  09/21/2019-09/27/2019 Acute Respiratory Failure with Hypoxia, Emphysema, Chest pain, Hyponatremia, Malnutrition, ETOH abuse Tobacco Abuse   CT chest 6/10 >>Consolidation LUL and LLL, centrilobular emphysema, thick walled air/fluid collection Lt hemithorax 14.8 x 7.3 cm  CT chest 6/13 >> 4.2 cm ascending aorta, Lt chest tube in place with decreased Lt air-fluid collection, dense rounded mass like consolidation LUL, LLL consolidation, centrilobular emphysema  Micro Data:  Lt pleural fluid 5/23 >> Streptococcus constellatus MRSA PCR 6/10 >> negative Blood 6/10 >> Lt pleural fluid 6/11 >>negative  Antimicrobials:  Vancomycin 6/09 >> Zosyn 6/09 >> 6/10 Flagyl 6/10 >> 6/13 Cefepime 6/10 >>6/16 Linezolid 6/14 >> 6/16     12/10/2019 Pt. Presents for follow up after left lung pneumonia with empyema ( See synopsis above) . He was seen for hospital follow up 10/31/2019.    Test Results:  CXR 10/31/2019: Decreased size of loculated left hydropneumothorax/empyema since prior  study. Decreased left mid and lower lung airspace disease. Increased tiny right pleural effusion. COPD.  Echo 09/22/2019 Left ventricular ejection fraction, by estimation, is 60 to 65%. The left ventricle has normal function. The left ventricle has no regional wall motion abnormalities. Left ventricular diastolic parameters were normal. Right ventricular systolic function is normal. The right ventricular size is normal. Right atrial size was mildly dilated. The mitral valve is normal in structure. Trivial mitral valve regurgitation. The aortic valve is grossly normal. Aortic valve regurgitation is not visualized. No aortic stenosis is present. Aortic dilatation noted. There is mild to moderate dilatation of the aortic root measuring 40  CBC Latest Ref Rng & Units 10/15/2019 10/14/2019 10/13/2019  WBC 4.0 - 10.5 K/uL 24.6(H) 22.3(H) 18.8(H)  Hemoglobin 13.0 - 17.0 g/dL 4.0(G) 8.9(L) 8.9(L)  Hematocrit 39 - 52 % 28.9(L) 27.6(L) 26.5(L)  Platelets 150 - 400 K/uL 579(H) 484(H) 424(H)    BMP Latest Ref Rng & Units 10/15/2019 10/14/2019 10/13/2019  Glucose 70 - 99 mg/dL 867(Y) 195(K) 932(I)  BUN 6 - 20 mg/dL 8 10 8   Creatinine 0.61 - 1.24 mg/dL ) 7.12(W) 5.80(D)  Sodium 135 - 145 mmol/L 132(L) 131(L) 132(L)  Potassium 3.5 - 5.1 mmol/L 3.9 3.3(L) 3.4(L)  Chloride 98 - 111 mmol/L 96(L) 97(L) 97(L)  CO2 22 - 32 mmol/L 27 25 25   Calcium 8.9 - 10.3 mg/dL 8.0(L) 7.6(L) 7.7(L)    BNP No results found for: BNP  ProBNP No results found for: PROBNP  PFT No results found for: FEV1PRE, FEV1POST, FVCPRE, FVCPOST, TLC, DLCOUNC, PREFEV1FVCRT, PSTFEV1FVCRT  No results found.   Past medical hx  Past Medical History:  Diagnosis Date  . Alcohol abuse      Social History   Tobacco Use  . Smoking status: Current Every Day Smoker    Packs/day: 1.50    Years: 35.00    Pack years: 52.50    Types: Cigarettes    Start date: 21  . Smokeless tobacco: Never Used  . Tobacco comment:  currently smoking 14cigs per day as of 7/2  Vaping Use  . Vaping Use: Never used  Substance Use Topics  . Alcohol use: Not Currently  . Drug use: Yes    Types: Methamphetamines    Victor Torres reports that he has been smoking cigarettes. He started smoking about 35 years ago. He has a 52.50 pack-year smoking history. He has never used smokeless tobacco. He reports previous alcohol use. He reports current drug use. Drug: Methamphetamines.  Tobacco Cessation: Current every day smoker  Past surgical hx, Family hx, Social hx all reviewed.  Current Outpatient Medications on File Prior to Visit  Medication Sig  . albuterol (VENTOLIN HFA) 108 (90 Base) MCG/ACT inhaler Inhale 2 puffs into the lungs every 4 (four) hours as needed for wheezing or shortness of breath.  [name] amoxicillin-clavulanate (AUGMENTIN) 875-125 MG tablet Take 1 tablet by mouth 2 (two) times daily.  . folic acid (FOLVITE) 1 MG tablet Take 1 tablet (1 mg total) by mouth daily.  [name] ibuprofen (ADVIL) 400 MG tablet Take 1 tablet (400 mg total) by mouth every 8 (eight) hours as needed for moderate pain.  . Multiple Vitamin (MULTIVITAMIN WITH MINERALS) TABS tablet Take 1 tablet by mouth daily.  . pantoprazole (PROTONIX) 40 MG tablet Take 1 tablet (40 mg total) by mouth daily.  [name] umeclidinium-vilanterol (ANORO ELLIPTA) 62.5-25 MCG/INH AEPB Inhale 1 puff into the lungs daily.   No current facility-administered medications on file prior to visit.     No Known Allergies  Review Of Systems:  Constitutional:   No  weight loss, night sweats,  Fevers, chills, fatigue, or  lassitude.  HEENT:   No headaches,  Difficulty swallowing,  Tooth/dental problems, or  Sore throat,                No sneezing, itching, ear ache, nasal congestion, post nasal drip,   CV:  No chest pain,  Orthopnea, PND, swelling in lower extremities, anasarca, dizziness, palpitations, syncope.   GI  No heartburn, indigestion, abdominal pain, nausea, vomiting,  diarrhea, change in bowel habits, loss of appetite, bloody stools.   Resp: No shortness of breath with exertion or at rest.  No excess mucus, no productive cough,  No non-productive cough,  No coughing up of blood.  No change in color of mucus.  No wheezing.  No chest wall deformity  Skin: no rash or lesions.  GU: no dysuria, change in color of urine, no urgency or frequency.  No flank pain, no hematuria   MS:  No joint pain or swelling.  No decreased range of motion.  No back pain.  Psych:  No change in mood or affect. No depression or anxiety.  No memory loss.   Vital Signs There were no vitals taken for this visit.   Physical Exam:  General- No distress,  A&Ox3 ENT: No sinus tenderness, TM clear, pale nasal mucosa, no oral exudate,no post nasal drip, no LAN Cardiac: S1, S2, regular rate and rhythm, no murmur Chest: No wheeze/ rales/ dullness; no accessory muscle use, no nasal flaring, no sternal retractions Abd.: Soft Non-tender Ext: No  clubbing cyanosis, edema Neuro:  normal strength Skin: No rashes, warm and dry Psych: normal mood and behavior   Assessment/Plan Recurrent Lt chest empyema with Lt upper and lower lobe pneumonia. CXR 10/31/2019 with Decreased size of loculated left hydropneumothorax/empyema  Decreased left mid and lower lung airspace disease. Increased tiny right pleural effusion. Suspected COPD.>> Over using rescue inhaler Plan  - chest tube d/c'ed 6/15, site healing no drainage - Has completed  oral augmentin to complete 3 week course of ABx from 6/09 - CXR done today, as described above - Aggressive pulmonary Toilet - Use your Incentive Spirometer with each commercial when watching TV - We will do a  a therapeutic trial of Anoro - This is a maintenance inhaler for COPD.  - Use this one puff once daily. - Rinse mouth after use. - We will refill your rescue inhaler - Do not use more than 4 times a day, must wait 4 hours in between.  - Use your  rescue inhaler as needed  for breakthrough shortness of breath    - Follow up OV in 2 weeks with Maralyn Sago NP, with CXR prior.  Hx of tobacco abuse with changes of centrilobular emphysema on CT chest. Has resumed smoking 14 cigarettes daily since discharge He is over using his rescue inhaler Plan - will need PFTs as outpt when clinically stable and evaluate for treatment of suspected COPD - prn albuterol - Will add Anoro once daily to see if he can reduce his rescue inhaler use ( No ICS until full resolution of empyema.) - We will refer for Lung Cancer Screening , first scan once he is recovered from his pneumonia/ Empyema  Physical Deconditioning Plan Referral to Pulmonary Rehab( place order under Dr. Craige Cotta) Continue to slowly advance your activity  Weight loss of 30 pounds in last 6 weeks with 2 hospitalizations States he has a good appetite. Plan Add Boost/ Ensure once daily as supplement Weight checks at follow up  Poor dentition. - likely source of pneumonia and empyema - advised again  he will need assessment by dentist  Now he is discharged  Aortic root dilation. No PCP - will need monitor as outpt - We have referred to Primary care this visit  Suspected Demand Ischemia as etiology for  Elevated Troponin's as inpatient Plan  - We will refer to cardiology for eval      Bevelyn Ngo, NP 12/10/2019  1:00 PM

## 2019-12-11 ENCOUNTER — Telehealth: Payer: Self-pay | Admitting: Acute Care

## 2019-12-11 NOTE — Telephone Encounter (Signed)
Patient contacted, he is reporting history of empyema with two recent admissions. Patient now has more pain and more pressure on affected side. He is mildly short of breath, mild cough, mild wheeze. He is reporting that drainage is coming from the hole that is grey/milky/yellow. RN discussed case with NP Tammy Parrett and she recommended he go the ED for a stat CXR and evaluation. We do not have any openings today to see patient. Patient verbalized understanding of plan, stating he just wanted to go to an urgent care, RN advised patient to seek emergent care at the [facility]. Patient did agree to plan.

## 2019-12-31 ENCOUNTER — Encounter: Payer: Self-pay | Admitting: Acute Care

## 2019-12-31 ENCOUNTER — Ambulatory Visit (INDEPENDENT_AMBULATORY_CARE_PROVIDER_SITE_OTHER): Payer: 59 | Admitting: Acute Care

## 2019-12-31 ENCOUNTER — Other Ambulatory Visit: Payer: Self-pay

## 2019-12-31 ENCOUNTER — Ambulatory Visit (INDEPENDENT_AMBULATORY_CARE_PROVIDER_SITE_OTHER): Payer: 59

## 2019-12-31 VITALS — BP 130/70 | HR 89 | Temp 97.2°F | Ht 73.0 in | Wt 164.1 lb

## 2019-12-31 DIAGNOSIS — J189 Pneumonia, unspecified organism: Secondary | ICD-10-CM

## 2019-12-31 DIAGNOSIS — J869 Pyothorax without fistula: Secondary | ICD-10-CM | POA: Diagnosis not present

## 2019-12-31 DIAGNOSIS — R5381 Other malaise: Secondary | ICD-10-CM

## 2019-12-31 DIAGNOSIS — F17210 Nicotine dependence, cigarettes, uncomplicated: Secondary | ICD-10-CM | POA: Diagnosis not present

## 2019-12-31 MED ORDER — ANORO ELLIPTA 62.5-25 MCG/INH IN AEPB: 1.0000 | INHALATION_SPRAY | Freq: Every day | RESPIRATORY_TRACT | 0 refills | Status: DC

## 2019-12-31 NOTE — Progress Notes (Addendum)
History of Present Illness Victor Torres is a 49 y.o. male with current every day smoker  with history of ETOH abuse. He was seen a an inpatient 08/2019  for left lung pneumonia with empyema. by Dr. Craige CottaSood   Synopsis 49 yo male smoker with hx of ETOH admitted from 09/21/19 to 09/27/19 with Lt lung pneumonia with empyema. Had chest tube placed on 5/24 and removed on 5/29. Pleural fluid culture from 5/23 grew Streptococcus constellatus. He was to continue ABx for total course of 21 days, and d/c home with omnicef. He returned to ER on 10/08/19 with fever, productive cough and dyspnea. Found to have recurrent Lt pleural space empyema and was readmitted and  had chest tube placed again on 10/10/19. He was treated with vancomycin/metronidazole/cefepime . The chest tube was discontinued 10/14/2019.  He was discharged on 10/15/2019 with 2 weeks of Augmentin . He states he is using his proventil afew times a day.   This illness has caused physical deconditioning.  He has been out of work for 5 months, and is very stressed about finances. .    12/31/2019  Presents for follow up. He had called the office 8/12 with chest pain and pressure on the left.( Side of his previous empyema)  He endorsed drainage was  coming from his left chest tube site  that was  grey/milky/yellow. He had missed an appointment the day before, and there were no openings for the patient to be seen that day. He was told to go to the ED for a CXR. He did not go, but states that he got better on his own. He had some antibiotics left over from a previous hospital admission, and he took them. He feels he had about 18 pills left. He was taking them twice daily. He actually got better. He is unsure of what this was that he took. He thinks it may have been omnicef. He would like additional antibiotic. He is anxious to return to work. He states he cannot financially manage without working. He denies fever, chest pain, orthopnea or hemoptysis.  Left  chest tube site has healed and had had no drainage. He has lost about 20 pounds over the course of his illness. He is still smoking, but states less than 1 Pack per day. He has not taken the Covid Vaccine  Test Results: Recent Hospitalizations 10/08/2019-10/15/2019 CAP and Pressure Injury of the skin  09/21/2019-09/27/2019 Acute Respiratory Failure with Hypoxia, Emphysema, Chest pain, Hyponatremia, Malnutrition, ETOH abuse Tobacco Abuse   CT chest 6/10 >>Consolidation LUL and LLL, centrilobular emphysema, thick walled air/fluid collection Lt hemithorax 14.8 x 7.3 cm  CT chest 6/13 >> 4.2 cm ascending aorta, Lt chest tube in place with decreased Lt air-fluid collection, dense rounded mass like consolidation LUL, LLL consolidation, centrilobular emphysema  Micro Data:  Lt pleural fluid 5/23 >> Streptococcus constellatus MRSA PCR 6/10 >> negative Blood 6/10 >> Lt pleural fluid 6/11 >>negative  Antimicrobials:  Vancomycin 6/09 >> Zosyn 6/09 >> 6/10 Flagyl 6/10 >> 6/13 Cefepime 6/10 >>6/16 Linezolid 6/14 >> 6/16   CXR 12/31/2019:  Persistent and slightly increasing loculated pleural fluid/opacity in the left lung base from prior exam, the air component has resolved. An adjacent nodular opacity is not well characterized by radiograph. Consider re-evaluation with chest CT (preferably with IV contrast) for more detailed parenchymal assessment including the nodular opacity. Unchanged blunting of the right costophrenic angle may represent small effusion.  CBC 12/31/2019 CBC    Component Value Date/Time  WBC 14.1 (H) 12/31/2019 1620   RBC 3.92 (L) 12/31/2019 1620   HGB 12.0 (L) 12/31/2019 1620   HCT 36.6 (L) 12/31/2019 1620   PLT 399.0 12/31/2019 1620   MCV 93.4 12/31/2019 1620   MCH 30.6 10/15/2019 0515   MCHC 32.6 12/31/2019 1620   RDW 16.0 (H) 12/31/2019 1620   LYMPHSABS 2.3 12/31/2019 1620   MONOABS 1.4 (H) 12/31/2019 1620   EOSABS 0.5 12/31/2019 1620   BASOSABS 0.1  12/31/2019 1620    CXR 10/31/2019 Decreased size of loculated left hydropneumothorax/empyema since prior study. Decreased left mid and lower lung airspace disease. Increased tiny right pleural effusion. COPD.  Echo 09/22/2019 Left ventricular ejection fraction, by estimation, is 60 to 65%. The left ventricle has normal function. The left ventricle has no regional wall motion abnormalities. Left ventricular diastolic parameters were normal. Right ventricular systolic function is normal. The right ventricular size is normal. Right atrial size was mildly dilated. The mitral valve is normal in structure. Trivial mitral valve regurgitation. The aortic valve is grossly normal. Aortic valve regurgitation is not visualized. No aortic stenosis is present. Aortic dilatation noted. There is mild to moderate dilatation of the aortic root measuring 40 mm  CBC Latest Ref Rng & Units 10/15/2019 10/14/2019 10/13/2019  WBC 4.0 - 10.5 K/uL 24.6(H) 22.3(H) 18.8(H)  Hemoglobin 13.0 - 17.0 g/dL 1.9(F) 8.9(L) 8.9(L)  Hematocrit 39 - 52 % 28.9(L) 27.6(L) 26.5(L)  Platelets 150 - 400 K/uL 579(H) 484(H) 424(H)    BMP Latest Ref Rng & Units 10/15/2019 10/14/2019 10/13/2019  Glucose 70 - 99 mg/dL 790(W) 409(B) 353(G)  BUN 6 - 20 mg/dL 8 10 8   Creatinine 0.61 - 1.24 mg/dL ) 9.92(E) 2.68(T)  Sodium 135 - 145 mmol/L 132(L) 131(L) 132(L)  Potassium 3.5 - 5.1 mmol/L 3.9 3.3(L) 3.4(L)  Chloride 98 - 111 mmol/L 96(L) 97(L) 97(L)  CO2 22 - 32 mmol/L 27 25 25   Calcium 8.9 - 10.3 mg/dL 8.0(L) 7.6(L) 7.7(L)    BNP No results found for: BNP  ProBNP No results found for: PROBNP  PFT No results found for: FEV1PRE, FEV1POST, FVCPRE, FVCPOST, TLC, DLCOUNC, PREFEV1FVCRT, PSTFEV1FVCRT  No results found.   Past medical hx Past Medical History:  Diagnosis Date  . Alcohol abuse      Social History   Tobacco Use  . Smoking status: Current Every Day Smoker    Packs/day: 1.50    Years: 35.00    Pack  years: 52.50    Types: Cigarettes    Start date: 99  . Smokeless tobacco: Never Used  . Tobacco comment: currently smoking 14cigs per day as of 7/2  Vaping Use  . Vaping Use: Never used  Substance Use Topics  . Alcohol use: Not Currently  . Drug use: Yes    Types: Methamphetamines    Mr.Waddill reports that he has been smoking cigarettes. He started smoking about 35 years ago. He has a 52.50 pack-year smoking history. He has never used smokeless tobacco. He reports previous alcohol use. He reports current drug use. Drug: Methamphetamines.  Tobacco Cessation: Current every day smoker 52 pack year smoking history  Past surgical hx, Family hx, Social hx all reviewed.  Current Outpatient Medications on File Prior to Visit  Medication Sig  . albuterol (VENTOLIN HFA) 108 (90 Base) MCG/ACT inhaler Inhale 2 puffs into the lungs every 4 (four) hours as needed for wheezing or shortness of breath.  amoxicillin-clavulanate (AUGMENTIN) 875-125 MG tablet Take 1 tablet by mouth 2 (two) times  daily.  . folic acid (FOLVITE) 1 MG tablet Take 1 tablet (1 mg total) by mouth daily.  [name] ibuprofen (ADVIL) 400 MG tablet Take 1 tablet (400 mg total) by mouth every 8 (eight) hours as needed for moderate pain.  . Multiple Vitamin (MULTIVITAMIN WITH MINERALS) TABS tablet Take 1 tablet by mouth daily.  . pantoprazole (PROTONIX) 40 MG tablet Take 1 tablet (40 mg total) by mouth daily.  [name] umeclidinium-vilanterol (ANORO ELLIPTA) 62.5-25 MCG/INH AEPB Inhale 1 puff into the lungs daily.   No current facility-administered medications on file prior to visit.     No Known Allergies  Review Of Systems:  Constitutional:   +  weight loss, No night sweats,  Fevers, chills,+  fatigue, or  lassitude.  HEENT:   No headaches,  Difficulty swallowing,  + Tooth/dental problems, or  Sore throat, No sneezing, itching, ear ache, nasal congestion, post nasal drip,   CV:  No chest pain,  Orthopnea, PND, swelling in lower  extremities, anasarca, dizziness, palpitations, syncope.   GI  No heartburn, indigestion, abdominal pain, nausea, vomiting, diarrhea, change in bowel habits, loss of appetite, bloody stools.   Resp: Occasional  shortness of breath with exertion less at rest.  No excess mucus, no productive cough,  No non-productive cough,  No coughing up of blood.  No change in color of mucus.  No wheezing.  No chest wall deformity  Skin: no rash or lesions.   GU: no dysuria, change in color of urine, no urgency or frequency.  No flank pain, no hematuria   MS:  No joint pain or swelling.  No decreased range of motion.  No back pain.  Psych:  No change in mood or affect. No depression or anxiety.  No memory loss.   Vital Signs BP 130/70 (BP Location: Left Arm, Cuff Size: Normal)   Pulse 89   Temp (!) 97.2 F (36.2 C) (Oral)   Ht 6\' 1"  (1.854 m)   Wt 164 lb 1.6 oz (74.4 kg)   SpO2 97%   BMI 21.65 kg/m    Physical Exam:  General- No distress,  A&Ox3, pleasant ENT: No sinus tenderness, TM clear, pale nasal mucosa, no oral exudate,no post nasal drip, no LAN Cardiac: S1, S2, regular rate and rhythm, no murmur Chest: No wheeze/ rales/ dullness; no accessory muscle use, no nasal flaring, no sternal retractions Abd.: Soft Non-tender, ND, BS + Ext: No clubbing cyanosis, edema Neuro:  normal strength, but physical deconditioning Skin: No rashes, warm and dry, Healed L chest tube site without redness or drainage Psych: normal mood and behavior, anxious to return to work   Assessment/Plan Resolving L empyema  With resolving left upper and lower pneumonia Missed appointment 8/11 8/12 called office with purulent drainage from L chest tube site Took left over [place] he had at home and got better.  Wants to return to work Plan We will do a CXR today. We will do a CBC today We will call you with the results. Based on the results, we may need to treat with additional antibiotics. Plan will be to  return to work 01/12/2020 if labs and CXR look ok. We will provide a letter if you are safe to return to work once reviewing labs and CXR. Please work on healthy eating.   Follow up in 1 month with 01/14/2020 NP of Dr. Maralyn Sago. Please contact office for sooner follow up if symptoms do not improve or worsen or seek emergency care  Current every day smoker Suspected COPD Plan will need PFTs as outpt when clinically stable and evaluate for treatment of suspected COPD - prn albuterol - We will refer for Lung Cancer Screening , first scan once he is recovered from his pneumonia/ Empyema - States he has been using Anoro, and that it has helped.  - Less use of rescue inhaler   Physical deconditioning and weight loss Plan Slowly increase activity Work on eating a healthy diet with adequate calories for weight gain.  If you have a hard time with weight gain, let us know and we will refer you to a dietitian.    This appointment was 40 min long with over 50% of the time in direct face-to-face patient care, assessment, plan of care, and follow-up.  Addendum: 01/08/2020 Pt CXR shows Persistent and slightly increasing loculated pleural fluid/opacity in the left lung base from prior exam. WBC is 14,000. While patient feels better, needs to be treated again with Augmentin x 10 days ( initial cultures + for STREPTOCOCCUS CONSTELLATUS  with intermediate sensitivity to Penicillins)  We will repeat a CT Chest with contrast after he has completed his antibiotic therapy. Bevelyn Ngo, NP 12/31/2019  3:52 PM

## 2019-12-31 NOTE — Patient Instructions (Addendum)
It is good to see you today. We will do a CXR today. We will do a CBC today We will call you with the results. Based on the results, we may need to treat with additional antibiotics. Plan will be to return to work 01/12/2020 if labs and CXR look ok. We will provide a letter if you are safe to return to work. Please work on healthy eating.   Follow up in 1 month with Maralyn Sago NP of Dr. Craige Cotta. Please contact office for sooner follow up if symptoms do not improve or worsen or seek emergency care

## 2020-01-01 LAB — CBC WITH DIFFERENTIAL/PLATELET
Basophils Absolute: 0.1 10*3/uL (ref 0.0–0.1)
Basophils Relative: 0.7 % (ref 0.0–3.0)
Eosinophils Absolute: 0.5 10*3/uL (ref 0.0–0.7)
Eosinophils Relative: 3.8 % (ref 0.0–5.0)
HCT: 36.6 % — ABNORMAL LOW (ref 39.0–52.0)
Hemoglobin: 12 g/dL — ABNORMAL LOW (ref 13.0–17.0)
Lymphocytes Relative: 16.4 % (ref 12.0–46.0)
Lymphs Abs: 2.3 10*3/uL (ref 0.7–4.0)
MCHC: 32.6 g/dL (ref 30.0–36.0)
MCV: 93.4 fl (ref 78.0–100.0)
Monocytes Absolute: 1.4 10*3/uL — ABNORMAL HIGH (ref 0.1–1.0)
Monocytes Relative: 10.1 % (ref 3.0–12.0)
Neutro Abs: 9.7 10*3/uL — ABNORMAL HIGH (ref 1.4–7.7)
Neutrophils Relative %: 69 % (ref 43.0–77.0)
Platelets: 399 10*3/uL (ref 150.0–400.0)
RBC: 3.92 Mil/uL — ABNORMAL LOW (ref 4.22–5.81)
RDW: 16 % — ABNORMAL HIGH (ref 11.5–15.5)
WBC: 14.1 10*3/uL — ABNORMAL HIGH (ref 4.0–10.5)

## 2020-01-01 NOTE — Progress Notes (Signed)
Reviewed and agree with assessment/plan.   Coralyn Helling, MD [facility] Pulmonary/Critical Care 01/01/2020, 8:56 AM Pager:  [phone]

## 2020-01-08 ENCOUNTER — Other Ambulatory Visit: Payer: Self-pay | Admitting: Acute Care

## 2020-01-08 ENCOUNTER — Telehealth: Payer: Self-pay | Admitting: Acute Care

## 2020-01-08 ENCOUNTER — Encounter: Payer: Self-pay | Admitting: *Deleted

## 2020-01-08 DIAGNOSIS — J869 Pyothorax without fistula: Secondary | ICD-10-CM

## 2020-01-08 DIAGNOSIS — J189 Pneumonia, unspecified organism: Secondary | ICD-10-CM

## 2020-01-08 DIAGNOSIS — R059 Cough, unspecified: Secondary | ICD-10-CM

## 2020-01-08 MED ORDER — AMOXICILLIN-POT CLAVULANATE 875-125 MG PO TABS: 1.0000 | ORAL_TABLET | Freq: Two times a day (BID) | ORAL | 0 refills | Status: AC

## 2020-01-08 NOTE — Telephone Encounter (Signed)
Will await pt to call the office back

## 2020-01-08 NOTE — Addendum Note (Signed)
Addended by: Christen Butter on: 01/08/2020 05:09 PM   Modules accepted: Orders

## 2020-01-08 NOTE — Telephone Encounter (Signed)
Spoke with the pt and let him know about his results and Sarah's recommendations. He verbalized understanding. He states needs a note to return to work and would like to go back on Wed 01/14/20. Will provide this for him based on Sarah's statement regarding returning to work below. Maralyn Sago- did you want [place] to place the CT Order now? I see abx order placed but do not see where you ordered the ct yet, thanks!

## 2020-01-08 NOTE — Telephone Encounter (Signed)
Please place order for CT chest with contrast for 10-12 days from now. Thanks so much

## 2020-01-08 NOTE — Telephone Encounter (Signed)
OK order placed

## 2020-01-08 NOTE — Telephone Encounter (Signed)
I have called patient and left a message for him to call the office. His CXR shows slight increase in his loculated pleural fluid/opacity in the left lung base from prior exam, the air component has resolved. I am going to treat him with another 10 days of Augmentin, and he will need a CT chest with contrast after Augmentin treatment is completed.  If the patient returns the call ( I am not in the office this afternoon)  please let him know the above. It is fine for him to continue to work if he feels well enough to do so. Let him know I have called in the Rx and for him to complete it. Please let him know he will need a CT chest after treatment. I will place the orders for both the CT and the antibiotics.   Thanks so much

## 2020-01-21 ENCOUNTER — Other Ambulatory Visit: Payer: Self-pay

## 2020-01-21 ENCOUNTER — Telehealth: Payer: Self-pay | Admitting: Acute Care

## 2020-01-21 ENCOUNTER — Ambulatory Visit (INDEPENDENT_AMBULATORY_CARE_PROVIDER_SITE_OTHER)
Admission: RE | Admit: 2020-01-21 | Discharge: 2020-01-21 | Disposition: A | Discharge status: Home / Self Care | Payer: 59 | Source: Ambulatory Visit | Attending: Acute Care | Admitting: Acute Care

## 2020-01-21 DIAGNOSIS — R05 Cough: Secondary | ICD-10-CM

## 2020-01-21 DIAGNOSIS — J189 Pneumonia, unspecified organism: Secondary | ICD-10-CM

## 2020-01-21 DIAGNOSIS — J869 Pyothorax without fistula: Secondary | ICD-10-CM | POA: Diagnosis not present

## 2020-01-21 DIAGNOSIS — R059 Cough, unspecified: Secondary | ICD-10-CM

## 2020-01-21 MED ORDER — IOHEXOL 300 MG/ML  SOLN
80.0000 mL | Freq: Once | INTRAMUSCULAR | Status: AC | PRN
  Administered 2020-01-21: 80 mL via INTRAVENOUS

## 2020-01-21 NOTE — Telephone Encounter (Signed)
Rec'd faxed request from [employer] requesting 12/31/2019 ov notes. Faxed to [employer] attn: Chauncy Passy at [phone]. -pr

## 2020-01-22 NOTE — Progress Notes (Signed)
Please call patient and let him know his CT Chest does show some improvement, but that we will need to repeat the scan in 3 months to better evaluate for continued improvement. Tell him we will discuss in further detail at his appointment 10/6. Thaks

## 2020-02-04 ENCOUNTER — Ambulatory Visit (INDEPENDENT_AMBULATORY_CARE_PROVIDER_SITE_OTHER): Payer: 59

## 2020-02-04 ENCOUNTER — Ambulatory Visit (INDEPENDENT_AMBULATORY_CARE_PROVIDER_SITE_OTHER): Payer: 59 | Admitting: Acute Care

## 2020-02-04 ENCOUNTER — Encounter: Payer: Self-pay | Admitting: Acute Care

## 2020-02-04 ENCOUNTER — Other Ambulatory Visit: Payer: Self-pay

## 2020-02-04 VITALS — BP 120/80 | HR 78 | Temp 96.3°F | Ht 73.0 in | Wt 174.6 lb

## 2020-02-04 DIAGNOSIS — J181 Lobar pneumonia, unspecified organism: Secondary | ICD-10-CM

## 2020-02-04 DIAGNOSIS — J9601 Acute respiratory failure with hypoxia: Secondary | ICD-10-CM | POA: Diagnosis not present

## 2020-02-04 DIAGNOSIS — R911 Solitary pulmonary nodule: Secondary | ICD-10-CM | POA: Diagnosis not present

## 2020-02-04 DIAGNOSIS — F17210 Nicotine dependence, cigarettes, uncomplicated: Secondary | ICD-10-CM | POA: Diagnosis not present

## 2020-02-04 DIAGNOSIS — R5381 Other malaise: Secondary | ICD-10-CM

## 2020-02-04 DIAGNOSIS — Z720 Tobacco use: Secondary | ICD-10-CM

## 2020-02-04 DIAGNOSIS — R0602 Shortness of breath: Secondary | ICD-10-CM

## 2020-02-04 DIAGNOSIS — J869 Pyothorax without fistula: Secondary | ICD-10-CM

## 2020-02-04 DIAGNOSIS — J189 Pneumonia, unspecified organism: Secondary | ICD-10-CM | POA: Diagnosis not present

## 2020-02-04 MED ORDER — ANORO ELLIPTA 62.5-25 MCG/INH IN AEPB
1.0000 | INHALATION_SPRAY | Freq: Every day | RESPIRATORY_TRACT | 0 refills | Status: AC
Start: 2020-02-04 — End: ?

## 2020-02-04 NOTE — Progress Notes (Addendum)
History of Present Illness Victor Torres is a 49 y.o. male current every day smokerwith history of ETOH abuse. He was seen a an inpatient 08/2019  for left lung pneumonia with empyema which has been slow to resolve. He was seen by Dr. Craige Cotta as inpatient.   Synopsis 49 yo male smoker with hx of ETOH admitted from 09/21/19 to 09/27/19 with Lt lung pneumonia with empyema. Had chest tube placed on 5/24 and removed on 5/29. Pleural fluid culture from 5/23 grew Streptococcus constellatus. He was to continue ABx for total course of 21 days, and d/c home with omnicef. He returned to ER on 10/08/19 with fever, productive cough and dyspnea. Found to have recurrent Lt pleural space empyema and was readmitted and  had chest tube placed again on 10/10/19. He was treated with vancomycin/metronidazole/cefepime . The chest tube was discontinued 10/14/2019.  He was discharged on 10/15/2019 with 2 weeks of Augmentin .   02/04/2020  Pt. Presents for follow up of his slow to resolve pneumonia. He has been out of work x 5 months.  . He states he is doing better. He was last seen 12/31/2019. CT Chest at the time significant interval reduction in very dense,masslike airspace opacity predominantly involving the peripheral left upper lobe seen on prior examination.There was also notation of a significant component of spiculated appearing opacity superiorly, measuring approximately 4.4 x 2.8 cm and somewhat concerning for malignancy. There was concern that PET imaging may not be helpful, as this could be reflective of infection/ inflammation. He had a small loculated pleural effusion, and  significant interval change in enlarged pretracheal and left hilar lymph nodes, likely reactive although nonspecific. We made the decision to do a short interval follow up CT chest in November  2021. The spiculated mass is concerning. He has gained back 10 pounds of the 30 he lost while he was sick.   Pt. Has returned to work. He was very  anxious about the amount of work he had missed through his illness.  He has had  no fever. He states he has had some intermittent chest pains on the left, but nothing like he had in the hospital . They are near his old chest tube site. He is compliant with his Anoro daily. He uses his albuterol inhaler about 3 times a week. He has never had PFT's. As he is improving we will schedule these to diagnose his COPD and quantify it. I feel he does need triple therapy, but will hold off until pneumonia has resolved, as I have concerns adding inhaled corticosteroids.  He does have fatigue now that he is working again. He knows this is because he was deconditioned with his long illness. He knows this will improve as he resumes his pre illness activities.[name] He reports no further drainage from the ol Chest tube site., it appears to be healed.  Test Results: Recent Hospitalizations 10/08/2019-10/15/2019 CAP and Pressure Injury of the skin  09/21/2019-09/27/2019 Acute Respiratory Failure with Hypoxia, Emphysema, Chest pain, Hyponatremia, Malnutrition, ETOH abuse Tobacco Abuse   CT chest 6/10 >>Consolidation LUL and LLL, centrilobular emphysema, thick walled air/fluid collection Lt hemithorax 14.8 x 7.3 cm  CT chest 6/13 >> 4.2 cm ascending aorta, Lt chest tube in place with decreased Lt air-fluid collection, dense rounded mass like consolidation LUL, LLL consolidation, centrilobular emphysema  Micro Data:  Lt pleural fluid 5/23 >> Streptococcus constellatus MRSA PCR 6/10 >> negative Blood 6/10 >> Lt pleural fluid 6/11 >>negative  Antimicrobials:  Vancomycin  6/09 >> Zosyn 6/09 >> 6/10 Flagyl 6/10 >> 6/13 Cefepime 6/10 >>6/16 Linezolid 6/14 >> 6/16  CXR 10/6 Chronic changes similar to that noted on recent CT examination with loculated left-sided pleural effusion laterally and some scarring and a more masslike spiculated area superiorly. PET-CT may be helpful as previously suggested. No new focal  abnormality is noted.     CXR 12/31/2019:  Persistent and slightly increasing loculated pleural fluid/opacity in the left lung base from prior exam, the air component has resolved. An adjacent nodular opacity is not well characterized by radiograph. Consider re-evaluation with chest CT (preferably with IV contrast) for more detailed parenchymal assessment including the nodular opacity. Unchanged blunting of the right costophrenic angle may represent small effusion.  CT Chest with Contrast 01/21/2020  Lungs/Pleura: There has been significant interval reduction in very dense, masslike airspace opacity predominantly involving the peripheral left upper lobe seen on prior examination. There is however a significant component of spiculated appearing opacity superiorly, measuring approximately 4.4 x 2.8 cm (series 2, image 84) a previously seen left-sided chest tube has been removed. There is a small, loculated lateral left pleural effusion with pleural thickening. Mild underlying centrilobular and paraseptal emphysema. IMPRESSION: 1. There has been significant interval reduction in very dense, masslike airspace opacity predominantly involving the peripheral left upper lobe seen on prior examination. There is however a significant component of spiculated appearing opacity superiorly, measuring approximately 4.4 x 2.8 cm and somewhat concerning for malignancy. This may be characterized for metabolic activity by PET-CT, however acute infection or inflammation may demonstrate significant FDG avidity and be difficult to discern from malignancy. At minimum, recommend additional short-term follow-up to ensure progressive resolution. 2. A previously seen left-sided chest tube has been removed. There is a small, loculated lateral left pleural effusion with pleural thickening. The presence or absence of infection is not established by CT. 3. No significant interval change in enlarged  pretracheal and left hilar lymph nodes, likely reactive although nonspecific.  CBC 02/04/2020    CBC 12/31/2019 CBC Labs (Brief)          Component Value Date/Time   WBC 14.1 (H) 12/31/2019 1620   RBC 3.92 (L) 12/31/2019 1620   HGB 12.0 (L) 12/31/2019 1620   HCT 36.6 (L) 12/31/2019 1620   PLT 399.0 12/31/2019 1620   MCV 93.4 12/31/2019 1620   MCH 30.6 10/15/2019 0515   MCHC 32.6 12/31/2019 1620   RDW 16.0 (H) 12/31/2019 1620   LYMPHSABS 2.3 12/31/2019 1620   MONOABS 1.4 (H) 12/31/2019 1620   EOSABS 0.5 12/31/2019 1620   BASOSABS 0.1 12/31/2019 1620      CXR 10/31/2019 Decreased size of loculated left hydropneumothorax/empyema since prior study. Decreased left mid and lower lung airspace disease. Increased tiny right pleural effusion. COPD.  Echo 09/22/2019 Left ventricular ejection fraction, by estimation, is 60 to 65%. The left ventricle has normal function. The left ventricle has no regional wall motion abnormalities. Left ventricular diastolic parameters were normal. Right ventricular systolic function is normal. The right ventricular size is normal. Right atrial size was mildly dilated. The mitral valve is normal in structure. Trivial mitral valve regurgitation. The aortic valve is grossly normal. Aortic valve regurgitation is not visualized. No aortic stenosis is present. Aortic dilatation noted. There is mild to moderate dilatation of the aortic root measuring 40 mm    CBC Latest Ref Rng & Units 12/31/2019 10/15/2019 10/14/2019  WBC 4.0 - 10.5 K/uL 14.1(H) 24.6(H) 22.3(H)  Hemoglobin 13.0 - 17.0 g/dL 12.0(L)  9.3(L) 8.9(L)  Hematocrit 39 - 52 % 36.6(L) 28.9(L) 27.6(L)  Platelets 150 - 400 K/uL 399.0 579(H) 484(H)    BMP Latest Ref Rng & Units 10/15/2019 10/14/2019 10/13/2019  Glucose 70 - 99 mg/dL 161(W109(H) 960(A176(H) 540(J143(H)  BUN 6 - 20 mg/dL 8 10 8   Creatinine 0.61 - 1.24 mg/dL 8.11(B0.56(L) 1.47(W0.49(L) 2.95(A0.59(L)  Sodium 135 - 145 mmol/L 132(L) 131(L) 132(L)    Potassium 3.5 - 5.1 mmol/L 3.9 3.3(L) 3.4(L)  Chloride 98 - 111 mmol/L 96(L) 97(L) 97(L)  CO2 22 - 32 mmol/L 27 25 25   Calcium 8.9 - 10.3 mg/dL 8.0(L) 7.6(L) 7.7(L)    BNP No results found for: BNP  ProBNP No results found for: PROBNP  PFT No results found for: FEV1PRE, FEV1POST, FVCPRE, FVCPOST, TLC, DLCOUNC, PREFEV1FVCRT, PSTFEV1FVCRT  CT Chest W Contrast  Result Date: 01/21/2020 CLINICAL DATA:  Cough, persistent pneumonia EXAM: CT CHEST WITH CONTRAST TECHNIQUE: Multidetector CT imaging of the chest was performed during intravenous contrast administration. CONTRAST:  80mL OMNIPAQUE IOHEXOL 300 MG/ML  SOLN COMPARISON:  CT chest, 10/12/2019 FINDINGS: Cardiovascular: Aortic atherosclerosis. Normal heart size. Three-vessel coronary artery calcifications. No pericardial effusion. Mediastinum/Nodes: No significant interval change in enlarged pretracheal and left hilar lymph nodes. Thyroid gland, trachea, and esophagus demonstrate no significant findings. Lungs/Pleura: There has been significant interval reduction in very dense, masslike airspace opacity predominantly involving the peripheral left upper lobe seen on prior examination. There is however a significant component of spiculated appearing opacity superiorly, measuring approximately 4.4 x 2.8 cm (series 2, image 84) a previously seen left-sided chest tube has been removed. There is a small, loculated lateral left pleural effusion with pleural thickening. Mild underlying centrilobular and paraseptal emphysema. Upper Abdomen: No acute abnormality. Musculoskeletal: No chest wall mass or suspicious bone lesions identified. IMPRESSION: 1. There has been significant interval reduction in very dense, masslike airspace opacity predominantly involving the peripheral left upper lobe seen on prior examination. There is however a significant component of spiculated appearing opacity superiorly, measuring approximately 4.4 x 2.8 cm and somewhat concerning  for malignancy. This may be characterized for metabolic activity by PET-CT, however acute infection or inflammation may demonstrate significant FDG avidity and be difficult to discern from malignancy. At minimum, recommend additional short-term follow-up to ensure progressive resolution. 2. A previously seen left-sided chest tube has been removed. There is a small, loculated lateral left pleural effusion with pleural thickening. The presence or absence of infection is not established by CT. 3. No significant interval change in enlarged pretracheal and left hilar lymph nodes, likely reactive although nonspecific. 4. Emphysema (ICD10-J43.9). 5. Coronary artery disease. Aortic Atherosclerosis (ICD10-I70.0). Electronically Signed   By: Lauralyn PrimesAlex  Bibbey M.D.   On: 01/21/2020 13:46     Past medical hx Past Medical History:  Diagnosis Date  . Alcohol abuse      Social History   Tobacco Use  . Smoking status: Current Every Day Smoker    Packs/day: 0.50    Years: 35.00    Pack years: 17.50    Types: Cigarettes    Start date: [id number]  . Smokeless tobacco: Never Used  . Tobacco comment: currently smoking 14cigs per day as of 7/2  Vaping Use  . Vaping Use: Never used  Substance Use Topics  . Alcohol use: Not Currently  . Drug use: Yes    Types: Methamphetamines    Tobacco Cessation: Current every day smoker with a 52.5 pack year smoking history ( 1.5 pack per dy x 35 years) Currently smoking  about 14 cigarettes daily  I have spent 3 minutes counseling patient on smoking cessation this visit. Patient verbalizes understanding that  continued smoking leads to negative health consequences including worsening of COPD, risk of lung cancer , stroke and heart disease.[name]     Past surgical hx, Family hx, Social hx all reviewed.  Current Outpatient Medications on File Prior to Visit  Medication Sig  . albuterol (VENTOLIN HFA) 108 (90 Base) MCG/ACT inhaler Inhale 2 puffs into the lungs every 4 (four) hours  as needed for wheezing or shortness of breath.  . folic acid (FOLVITE) 1 MG tablet Take 1 tablet (1 mg total) by mouth daily.  [name] ibuprofen (ADVIL) 400 MG tablet Take 1 tablet (400 mg total) by mouth every 8 (eight) hours as needed for moderate pain.  . Multiple Vitamin (MULTIVITAMIN WITH MINERALS) TABS tablet Take 1 tablet by mouth daily.  . pantoprazole (PROTONIX) 40 MG tablet Take 1 tablet (40 mg total) by mouth daily.  [name] umeclidinium-vilanterol (ANORO ELLIPTA) 62.5-25 MCG/INH AEPB Inhale 1 puff into the lungs daily.   No current facility-administered medications on file prior to visit.     No Known Allergies  Review Of Systems:  Constitutional:   No  weight loss, night sweats,  Fevers, chills,+  fatigue, or  lassitude.  HEENT:   No headaches,  Difficulty swallowing,  Tooth/dental problems, or  Sore throat,                No sneezing, itching, ear ache, nasal congestion, post nasal drip,   CV: Occasional pain at old Chest tube site , No Orthopnea, PND, swelling in lower extremities, anasarca, dizziness, palpitations, syncope.   GI  No heartburn, indigestion, abdominal pain, nausea, vomiting, diarrhea, change in bowel habits, loss of appetite, bloody stools.   Resp: + shortness of breath with exertion none at rest.  Baseline  excess mucus, + productive cough,  + non-productive cough,  No coughing up of blood.  No change in color of mucus.  No wheezing.  No chest wall deformity  Skin: no rash or lesions. Healed L CT site  GU: no dysuria, change in color of urine, no urgency or frequency.  No flank pain, no hematuria   MS:  No joint pain or swelling.  No decreased range of motion.  No back pain.  Psych:  No change in mood or affect. No depression or anxiety.  No memory loss.   Vital Signs BP 120/80 (BP Location: Left Arm, Cuff Size: Normal)   Pulse 78   Temp (!) 96.3 F (35.7 C) (Oral)   Ht 6\' 1"  (1.854 m)   Wt 174 lb 9.6 oz (79.2 kg)   SpO2 98%   BMI 23.04 kg/m     Physical Exam:  General- No distress,  A&Ox3,, appropriate ENT: No sinus tenderness, TM clear, pale nasal mucosa, no oral exudate,no post nasal drip, no LAN Cardiac: S1, S2, regular rate and rhythm, no murmur Chest: No wheeze/ rales/ + dullness per bases bilaterlly; no accessory muscle use, no nasal flaring, no sternal retractions Abd.: Soft Non-tender, ND, BS +, Body mass index is 23.04 kg/m. Ext: No clubbing cyanosis, edema Neuro:  Improving strength, has been deconditioned for slow to resolve illness. Skin: No rashes,No lesions,  warm and dry Psych: normal mood and behavior   Assessment/Plan  Resolving L empyema  With resolving left upper and lower pneumonia Missed appointment 8/11 8/12 called office with purulent drainage from L chest tube site Took left over  Omnicef he had at home and got better.  Has been back at work x 3 weeks Has gained 10 pounds of the 30 he lost during his illness Plan We will do a CXR today. We will do a CBC today We will call you with the results. Continue to work on healthy eating.   Follow up in 2 month with Maralyn Sago NP of Dr. Craige Cotta. Follow up CT Chest first week of November to re-evaluate area of concern Considr bronch Please contact office for sooner follow up if symptoms do not improve or worsen or seek emergency care     Current every day smoker Suspected COPD CT with ? Mass, needs follow up ( scheduled 1st week November 2021) Referred to Lung Cancer Screening Program last visit Plan - We will order PFT's  - Conitinue prn albuterol - Continue Anoro daily as you have been doing - Counseled to quit smoking   Physical deconditioning and weight loss Plan Slowly increase activity Work on eating a healthy diet with adequate calories for weight gain. Consider adding Boost s a meal supplement   Will discuss continued effusion/ Empyema with Dr. Cliffton Asters.  May need to obtain a PET scan to determine need for either bronch with biopsy  or VATS.   Bevelyn Ngo, NP 02/04/2020  4:50 PM

## 2020-02-04 NOTE — Patient Instructions (Addendum)
It is good to see you today. Continue using Anoro once daily as you have been doing. Rinse mouth after use Continue using your rescue inhaler as needed for shortness of breath or wheezing.  We will do a CXR today We will call you with results.  We will schedule you for PFT's  CT scan without contrast first week of 03/2020 We will call you to schedule this CBC today Follow up in 2 months ( After CT scan has been done) or sooner if needed Please contact office for sooner follow up if symptoms do not improve or worsen or seek emergency care

## 2020-02-05 ENCOUNTER — Telehealth: Payer: Self-pay | Admitting: Acute Care

## 2020-02-05 ENCOUNTER — Other Ambulatory Visit: Payer: Self-pay | Admitting: Acute Care

## 2020-02-05 DIAGNOSIS — J189 Pneumonia, unspecified organism: Secondary | ICD-10-CM

## 2020-02-05 LAB — CBC WITH DIFFERENTIAL/PLATELET
Basophils Absolute: 0.1 10*3/uL (ref 0.0–0.1)
Basophils Relative: 0.9 % (ref 0.0–3.0)
Eosinophils Absolute: 0.4 10*3/uL (ref 0.0–0.7)
Eosinophils Relative: 3.5 % (ref 0.0–5.0)
HCT: 35.8 % — ABNORMAL LOW (ref 39.0–52.0)
Hemoglobin: 11.9 g/dL — ABNORMAL LOW (ref 13.0–17.0)
Lymphocytes Relative: 23.1 % (ref 12.0–46.0)
Lymphs Abs: 2.7 10*3/uL (ref 0.7–4.0)
MCHC: 33.1 g/dL (ref 30.0–36.0)
MCV: 93.6 fl (ref 78.0–100.0)
Monocytes Absolute: 1.1 10*3/uL — ABNORMAL HIGH (ref 0.1–1.0)
Monocytes Relative: 9.2 % (ref 3.0–12.0)
Neutro Abs: 7.5 10*3/uL (ref 1.4–7.7)
Neutrophils Relative %: 63.3 % (ref 43.0–77.0)
Platelets: 291 10*3/uL (ref 150.0–400.0)
RBC: 3.83 Mil/uL — ABNORMAL LOW (ref 4.22–5.81)
RDW: 16.1 % — ABNORMAL HIGH (ref 11.5–15.5)
WBC: 11.8 10*3/uL — ABNORMAL HIGH (ref 4.0–10.5)

## 2020-02-05 NOTE — Progress Notes (Signed)
Reviewed and agree with assessment/plan.   Nataleah Scioneaux, MD [facility] 02/05/2020, 8:59 AM Pager:  [phone]  

## 2020-02-05 NOTE — Telephone Encounter (Signed)
-----   Message from Bevelyn Ngo, NP sent at 02/04/2020  5:51 PM EDT ----- Regarding: Needs CT Chest at beginning of Noveber Victor Torres, Please order CT chest without contrast for the first week of November. I had originally said December, but would like to do it sooner. Please let him know!! Thanks so much

## 2020-02-05 NOTE — Telephone Encounter (Signed)
I sent in order for Ct chest without contrast for the first week of November. I tried to call patient to notify but there was no answer. Left VM detailing change and to call with any questions. Nothing further needed.

## 2020-02-06 ENCOUNTER — Telehealth: Payer: Self-pay | Admitting: General Practice

## 2020-02-06 NOTE — Telephone Encounter (Signed)
Patient states he is returning a call from our office. I informed him he is not a patient of this office and I did not see any notes of someone calling him.

## 2020-02-10 ENCOUNTER — Other Ambulatory Visit: Payer: Self-pay | Admitting: Acute Care

## 2020-02-10 DIAGNOSIS — J869 Pyothorax without fistula: Secondary | ICD-10-CM

## 2020-02-10 NOTE — Progress Notes (Signed)
I have called and left a message for the patient. There was no answer. I left a message on his VM, explaining we will get a repeat CT in 3-4 weeks. I have ordered the scan. I have left the office number with the patient for any questions. I told him someone will call the patient to get this scheduled.

## 2020-02-12 NOTE — Progress Notes (Signed)
These results were called to the patient. He is scheduled for a follow up CT Chest first week of November.  Amy, I see this has been scheduled. Thanks so much for your help

## 2020-02-27 ENCOUNTER — Ambulatory Visit (INDEPENDENT_AMBULATORY_CARE_PROVIDER_SITE_OTHER)
Admission: RE | Admit: 2020-02-27 | Discharge: 2020-02-27 | Disposition: A | Discharge status: Home / Self Care | Payer: 59 | Source: Ambulatory Visit | Attending: Acute Care | Admitting: Acute Care

## 2020-02-27 ENCOUNTER — Other Ambulatory Visit: Payer: Self-pay

## 2020-02-27 DIAGNOSIS — J869 Pyothorax without fistula: Secondary | ICD-10-CM | POA: Diagnosis not present

## 2020-03-01 NOTE — Progress Notes (Signed)
I have called the patient with the results of his CT scan. I explained that there is still an area of concern in his LUL, and that Dr. Tonia Brooms would like to see him in the office next week. I told him he should get a call from the office to get this scheduled. He verbalized understanding and had no further questions at completion of the call.

## 2020-03-05 ENCOUNTER — Other Ambulatory Visit: Payer: Self-pay

## 2020-03-05 ENCOUNTER — Ambulatory Visit (INDEPENDENT_AMBULATORY_CARE_PROVIDER_SITE_OTHER): Payer: 59 | Admitting: Pulmonary Disease

## 2020-03-05 ENCOUNTER — Telehealth: Payer: Self-pay | Admitting: Pulmonary Disease

## 2020-03-05 ENCOUNTER — Encounter: Payer: Self-pay | Admitting: Pulmonary Disease

## 2020-03-05 VITALS — BP 154/50 | HR 86 | Temp 97.5°F | Wt 175.5 lb

## 2020-03-05 DIAGNOSIS — R911 Solitary pulmonary nodule: Secondary | ICD-10-CM

## 2020-03-05 DIAGNOSIS — J869 Pyothorax without fistula: Secondary | ICD-10-CM | POA: Diagnosis not present

## 2020-03-05 DIAGNOSIS — F17200 Nicotine dependence, unspecified, uncomplicated: Secondary | ICD-10-CM

## 2020-03-05 DIAGNOSIS — F17210 Nicotine dependence, cigarettes, uncomplicated: Secondary | ICD-10-CM | POA: Diagnosis not present

## 2020-03-05 DIAGNOSIS — Z720 Tobacco use: Secondary | ICD-10-CM

## 2020-03-05 DIAGNOSIS — J189 Pneumonia, unspecified organism: Secondary | ICD-10-CM

## 2020-03-05 DIAGNOSIS — J432 Centrilobular emphysema: Secondary | ICD-10-CM

## 2020-03-05 MED ORDER — UMECLIDINIUM-VILANTEROL 62.5-25 MCG/INH IN AEPB: 1.0000 | INHALATION_SPRAY | Freq: Every day | RESPIRATORY_TRACT | 2 refills | Status: AC

## 2020-03-05 MED ORDER — VARENICLINE TARTRATE 0.5 MG X 11 & 1 MG X 42 PO MISC: ORAL | 0 refills | Status: AC

## 2020-03-05 MED ORDER — ANORO ELLIPTA 62.5-25 MCG/INH IN AEPB: 1.0000 | INHALATION_SPRAY | Freq: Every day | RESPIRATORY_TRACT | 0 refills | Status: AC

## 2020-03-05 NOTE — Addendum Note (Signed)
Addended by: Marcellus Scott on: 03/05/2020 03:38 PM   Modules accepted: Orders

## 2020-03-05 NOTE — Telephone Encounter (Signed)
Yes ok to do loose tabs  [name], DO [facility] 03/05/2020 5:27 PM

## 2020-03-05 NOTE — Telephone Encounter (Signed)
Called pt's pharmacy and spoke with Bo Merino letting her know that Dr. Tonia Brooms said he was fine with the loose tabs for pt's chantix. Geri verbalized understanding. Nothing further needed.

## 2020-03-05 NOTE — Patient Instructions (Signed)
Thank you for visiting Dr. Tonia Brooms at [facility]. Today we recommend the following:  Orders Placed This Encounter  Procedures  . CT Chest Wo Contrast  . Pulmonary Function Test   Meds ordered this encounter  Medications  . varenicline (CHANTIX PAK) 0.5 MG X 11 & 1 MG X 42 tablet    Sig: Take one 0.5 mg tablet by mouth once daily for 3 days, then increase to one 0.5 mg tablet twice daily for 4 days, then increase to one 1 mg tablet twice daily.    Dispense:  53 tablet    Refill:  0  . umeclidinium-vilanterol (ANORO ELLIPTA) 62.5-25 MCG/INH AEPB    Sig: Inhale 1 puff into the lungs daily.    Dispense:  3 each    Refill:  2   STOP DATE: May 01, 2020  Return in about 3 months (around 06/05/2020) for Dr. Tonia Brooms . appt after PFT / CT complete     Please do your part to reduce the spread of COVID-19.   You must quit smoking or vaping. This is the single most important thing that you can do to improve your lung health.   S = Set a quit date. T = Tell family, friends, and the people around you that you plan to quit. A = Anticipate or plan ahead for the tough times you'll face while quitting. R = Remove cigarettes and other tobacco products from your home, car, and work T = Talk to [place] about getting help to quit  If you need help feel free to reach out to our office, [facility] Smoking Cessation Class: [phone], call 1-800-QUIT-NOW, or visit [email or web address].

## 2020-03-05 NOTE — Progress Notes (Signed)
Synopsis: Referred in November 2021 for abnormal CT chest by No ref. provider found  Subjective:   PATIENT ID: Victor Torres GENDER: male DOB: 05/21/1970, MRN: [id number]  Chief Complaint  Patient presents with  . Follow-up    reviewing CT results today    This is a 49 year old male past medical history of tobacco use, alcohol use.  Initially presented to [place] with evaluation for community-acquired pneumonia, post hospitalization follow-up.  He was admitted to the hospital in June 2021.  At the time found to have complicated hydropneumothorax.  Patient underwent chest tube placement during this hospitalization.  Subsequently had noncontrasted CT chest follow-up in September.  This revealed a spiculated component 4.4 x 2.8 cm concerning for potential malignancy.  He also has remnants of the prior loculated left-sided pleural effusion and pleural thickening.  I reviewed these images with Kandice Robinsons, NP at the last office visit.  Patient here today for follow-up after recent noncontrasted CT of the chest on February 27, 2020.  This spiculated area changed a little bit.  There may be some slight regression in the size.  Areas of distinct nodularity are also present within the left upper lobe these are all small subcentimeter in size.  Also has significant area of paraseptal and centrilobular emphysema.  OV 03/05/2020: Here today with ongoing SOB. He is unfortunately still smoking at this time approximately a pack a day.  He still occasionally has sputum production.  Denies hemoptysis.  He feels better now than he did when he was in the hospital.  He quit drinking.  Prior to the hospitalization was drinking 1/5/day and 12 beers per day.  Overall doing much better he has quit cold [place] and feels like he is slowly improving he still has cough and sputum production.  Today in the office we reviewed his CT scan imaging from his hospitalization until now.   Past Medical History:  Diagnosis Date  .  Alcohol abuse      No family history on file.   No past surgical history on file.  Social History   Socioeconomic History  . Marital status: Single    Spouse name: Not on file  . Number of children: Not on file  . Years of education: Not on file  . Highest education level: Not on file  Occupational History  . Not on file  Tobacco Use  . Smoking status: Current Every Day Smoker    Packs/day: 1.50    Years: 35.00    Pack years: 52.50    Types: Cigarettes    Start date: 31  . Smokeless tobacco: Never Used  . Tobacco comment: currently smoking 14cigs per day as of 7/2  Vaping Use  . Vaping Use: Never used  Substance and Sexual Activity  . Alcohol use: Not Currently  . Drug use: Yes    Types: Methamphetamines  . Sexual activity: Not on file  Other Topics Concern  . Not on file  Social History Narrative  . Not on file   Social Determinants of Health   Financial Resource Strain:   . Difficulty of Paying Living Expenses: Not on file  Food Insecurity:   . Worried About Programme researcher, broadcasting/film/video in the Last Year: Not on file  . Ran Out of Food in the Last Year: Not on file  Transportation Needs:   . Lack of Transportation (Medical): Not on file  . Lack of Transportation (Non-Medical): Not on file  Physical Activity:   . Days  of Exercise per Week: Not on file  . Minutes of Exercise per Session: Not on file  Stress:   . Feeling of Stress : Not on file  Social Connections:   . Frequency of Communication with Friends and Family: Not on file  . Frequency of Social Gatherings with Friends and Family: Not on file  . Attends Religious Services: Not on file  . Active Member of Clubs or Organizations: Not on file  . Attends Banker Meetings: Not on file  . Marital Status: Not on file  Intimate Partner Violence:   . Fear of Current or Ex-Partner: Not on file  . Emotionally Abused: Not on file  . Physically Abused: Not on file  . Sexually Abused: Not on file      No Known Allergies   Outpatient Medications Prior to Visit  Medication Sig Dispense Refill  . albuterol (VENTOLIN HFA) 108 (90 Base) MCG/ACT inhaler Inhale 2 puffs into the lungs every 4 (four) hours as needed for wheezing or shortness of breath. 8 g 5  . folic acid (FOLVITE) 1 MG tablet Take 1 tablet (1 mg total) by mouth daily.    [name] ibuprofen (ADVIL) 400 MG tablet Take 1 tablet (400 mg total) by mouth every 8 (eight) hours as needed for moderate pain.    . Multiple Vitamin (MULTIVITAMIN WITH MINERALS) TABS tablet Take 1 tablet by mouth daily.    . pantoprazole (PROTONIX) 40 MG tablet Take 1 tablet (40 mg total) by mouth daily. 30 tablet 1  . umeclidinium-vilanterol (ANORO ELLIPTA) 62.5-25 MCG/INH AEPB Inhale 1 puff into the lungs daily. 7 each 0  . umeclidinium-vilanterol (ANORO ELLIPTA) 62.5-25 MCG/INH AEPB Inhale 1 puff into the lungs daily. 14 each 0   No facility-administered medications prior to visit.    ROS   Objective:  Physical Exam   Vitals:   03/05/20 1407  BP: (!) 154/50  Pulse: 86  Temp: (!) 97.5 F (36.4 C)  TempSrc: Tympanic  SpO2: 96%  Weight: 175 lb 8 oz (79.6 kg)   96% on RA  BMI Readings from Last 3 Encounters:  03/05/20 23.15 kg/m  02/04/20 23.04 kg/m  12/31/19 21.65 kg/m   Wt Readings from Last 3 Encounters:  03/05/20 175 lb 8 oz (79.6 kg)  02/04/20 174 lb 9.6 oz (79.2 kg)  12/31/19 164 lb 1.6 oz (74.4 kg)     CBC    Component Value Date/Time   WBC 11.8 (H) 02/04/2020 1715   RBC 3.83 (L) 02/04/2020 1715   HGB 11.9 (L) 02/04/2020 1715   HCT 35.8 (L) 02/04/2020 1715   PLT 291.0 02/04/2020 1715   MCV 93.6 02/04/2020 1715   MCH 30.6 10/15/2019 0515   MCHC 33.1 02/04/2020 1715   RDW 16.1 (H) 02/04/2020 1715   LYMPHSABS 2.7 02/04/2020 1715   MONOABS 1.1 (H) 02/04/2020 1715   EOSABS 0.4 02/04/2020 1715   BASOSABS 0.1 02/04/2020 1715    Chest Imaging: 02/27/2020: CT chest Spiculated area within the lateral aspect of the lung  adjacent to the pleural thickening notes present.  I suspect this is an area of rounded atelectasis and inflammation and scarring that is slowly decreasing over time after his recent hospitalization for CAP. I reviewed all these images with the patient today as well as a subsequent images in the past. The patient's images have been independently reviewed by me.    Pulmonary Functions Testing Results: No flowsheet data found.  FeNO:   Pathology:   Echocardiogram:  Heart Catheterization:     Assessment & Plan:     ICD-10-CM   1. Lung nodule  R91.1 CT Chest Wo Contrast  2. Current smoker  F17.200 CT Chest Wo Contrast    Pulmonary Function Test  3. Tobacco abuse  Z72.0 CT Chest Wo Contrast    Pulmonary Function Test  4. Empyema (HCC)  J86.9 CT Chest Wo Contrast  5. Community acquired pneumonia of left lung, unspecified part of lung  J18.9 CT Chest Wo Contrast  6. Centrilobular emphysema (HCC)  J43.2 Pulmonary Function Test    Discussion: This is a 49 year old gentleman recent hospitalization this summer with community-acquired pneumonia, empyema.  Treated with antibiotics, pigtail catheter drainage.  He had a more proximal spiculated lesion that was adjacent to the empyema that was potentially concerning for malignancy.  This is decreasing slowly in size over time suggesting of inflammatory etiology versus malignancy.  Today in the office we reviewed the patient's images from June September and October.  He is slowly improving.  He is a current smoker has smoked for a long time.  At this point willing to quit and asking for help.  Please see smoking cessation recommendations below.  Plan: Repeat noncontrasted CT of the chest in 3 months.  This to follow the pleural disease and continued with hopeful resolution of the loculated effusion.  I suspect he will be left with scarring there.  I did discuss case with thoracic surgery in the past. Patient was instructed to follow himself and  observe closely for any sign of infection. Full pulmonary function test completed at the next office visit, he does have emphysema on his CT imaging. Continue Anoro Ellipta. Free samples of the Anoro today. Help with cost. [employer] financial aid application for inhaler cost.  Smoking Cessation Counseling:   The patient's current tobacco use: currently 1ppd, 30 years The patient was advised to quit and impact of smoking on their health.  I assessed the patient's willingness to attempt to quit. He is ready to quit he says  I provided methods and skills for cessation. We discussed tapering.  We reviewed medication management of smoking session drugs if appropriate. Done gum and nicotin supplements before. He has never used chantix before but willing to try Resources to help quit smoking were provided. A smoking cessation quit date was set: May 01, 2020 Follow-up was arranged in our clinic.  The amount of time spent counseling patient was 12 mins separate of office note.     Current Outpatient Medications:  .  albuterol (VENTOLIN HFA) 108 (90 Base) MCG/ACT inhaler, Inhale 2 puffs into the lungs every 4 (four) hours as needed for wheezing or shortness of breath., Disp: 8 g, Rfl: 5 .  folic acid (FOLVITE) 1 MG tablet, Take 1 tablet (1 mg total) by mouth daily., Disp:  , Rfl:  .  ibuprofen (ADVIL) 400 MG tablet, Take 1 tablet (400 mg total) by mouth every 8 (eight) hours as needed for moderate pain., Disp: , Rfl:  .  Multiple Vitamin (MULTIVITAMIN WITH MINERALS) TABS tablet, Take 1 tablet by mouth daily., Disp:  , Rfl:  .  pantoprazole (PROTONIX) 40 MG tablet, Take 1 tablet (40 mg total) by mouth daily., Disp: 30 tablet, Rfl: 1 .  umeclidinium-vilanterol (ANORO ELLIPTA) 62.5-25 MCG/INH AEPB, Inhale 1 puff into the lungs daily., Disp: 7 each, Rfl: 0 .  umeclidinium-vilanterol (ANORO ELLIPTA) 62.5-25 MCG/INH AEPB, Inhale 1 puff into the lungs daily., Disp: 14 each, Rfl: 0 .  umeclidinium-vilanterol  (  ANORO ELLIPTA) 62.5-25 MCG/INH AEPB, Inhale 1 puff into the lungs daily., Disp: 3 each, Rfl: 2 .  varenicline (CHANTIX PAK) 0.5 MG X 11 & 1 MG X 42 tablet, Take one 0.5 mg tablet by mouth once daily for 3 days, then increase to one 0.5 mg tablet twice daily for 4 days, then increase to one 1 mg tablet twice daily., Disp: 53 tablet, Rfl: 0  I spent 40 minutes dedicated to the care of this patient on the date of this encounter to include pre-visit review of records, face-to-face time with the patient discussing conditions above, post visit ordering of testing, clinical documentation with the electronic health record, making appropriate referrals as documented, and communicating necessary findings to members of the patients care team.    Josephine IgoBradley L Michio Thier, DO [facility] 03/05/2020 2:41 PM

## 2020-03-05 NOTE — Telephone Encounter (Signed)
Dr. Tonia Brooms, please advise what you want to do in regards to Rx due to chantix starter pack no longer being on market.

## 2020-06-04 ENCOUNTER — Inpatient Hospital Stay: Admission: RE | Admit: 2020-06-04 | Payer: 59 | Source: Ambulatory Visit

## 2020-09-29 ENCOUNTER — Other Ambulatory Visit: Payer: Self-pay

## 2020-09-29 ENCOUNTER — Encounter: Payer: Self-pay | Admitting: Emergency Medicine

## 2020-09-29 ENCOUNTER — Ambulatory Visit (INDEPENDENT_AMBULATORY_CARE_PROVIDER_SITE_OTHER): Payer: 59 | Admitting: Emergency Medicine

## 2020-09-29 VITALS — BP 158/58 | HR 83 | Temp 97.7°F | Ht 73.0 in | Wt 176.4 lb

## 2020-09-29 DIAGNOSIS — Z720 Tobacco use: Secondary | ICD-10-CM | POA: Diagnosis not present

## 2020-09-29 DIAGNOSIS — J432 Centrilobular emphysema: Secondary | ICD-10-CM

## 2020-09-29 DIAGNOSIS — Z7689 Persons encountering health services in other specified circumstances: Secondary | ICD-10-CM

## 2020-09-29 DIAGNOSIS — F17200 Nicotine dependence, unspecified, uncomplicated: Secondary | ICD-10-CM | POA: Diagnosis not present

## 2020-09-29 LAB — CBC WITH DIFFERENTIAL/PLATELET
Basophils Absolute: 0.1 10*3/uL (ref 0.0–0.1)
Basophils Relative: 0.6 % (ref 0.0–3.0)
Eosinophils Absolute: 0.3 10*3/uL (ref 0.0–0.7)
Eosinophils Relative: 3.4 % (ref 0.0–5.0)
HCT: 39.7 % (ref 39.0–52.0)
Hemoglobin: 13.3 g/dL (ref 13.0–17.0)
Lymphocytes Relative: 23.2 % (ref 12.0–46.0)
Lymphs Abs: 2.3 10*3/uL (ref 0.7–4.0)
MCHC: 33.5 g/dL (ref 30.0–36.0)
MCV: 96.7 fl (ref 78.0–100.0)
Monocytes Absolute: 1.1 10*3/uL — ABNORMAL HIGH (ref 0.1–1.0)
Monocytes Relative: 11.4 % (ref 3.0–12.0)
Neutro Abs: 6 10*3/uL (ref 1.4–7.7)
Neutrophils Relative %: 61.4 % (ref 43.0–77.0)
Platelets: 258 10*3/uL (ref 150.0–400.0)
RBC: 4.1 Mil/uL — ABNORMAL LOW (ref 4.22–5.81)
RDW: 13.9 % (ref 11.5–15.5)
WBC: 9.7 10*3/uL (ref 4.0–10.5)

## 2020-09-29 LAB — HEMOGLOBIN A1C: Hgb A1c MFr Bld: 6.1 % (ref 4.6–6.5)

## 2020-09-29 MED ORDER — ANORO ELLIPTA 62.5-25 MCG/INH IN AEPB: 1.0000 | INHALATION_SPRAY | Freq: Every day | RESPIRATORY_TRACT | 5 refills | Status: AC

## 2020-09-29 MED ORDER — ALBUTEROL SULFATE HFA 108 (90 BASE) MCG/ACT IN AERS: 2.0000 | INHALATION_SPRAY | RESPIRATORY_TRACT | 5 refills | Status: AC | PRN

## 2020-09-29 NOTE — Progress Notes (Signed)
Arman Bogus Esther 50 y.o.   Chief Complaint  Patient presents with  . New Patient (Initial Visit)  . Referral    Per Dr Alexandria Lodge to Pulmonary    HISTORY OF PRESENT ILLNESS: This is a 50 y.o. male not sure why he is here.  States he received a call from pulmonary office and was referred here for an appointment. Current smoker.  Diagnosed with COPD mostly emphysema.  Had community-acquired pneumonia with empyema last fall. Most recent pulmonary note from 03/05/2020 as follows: Assessment & Plan:        ICD-10-CM    1. Lung nodule  R91.1 CT Chest Wo Contrast  2. Current smoker  F17.200 CT Chest Wo Contrast      Pulmonary Function Test  3. Tobacco abuse  Z72.0 CT Chest Wo Contrast      Pulmonary Function Test  4. Empyema (HCC)  J86.9 CT Chest Wo Contrast  5. Community acquired pneumonia of left lung, unspecified part of lung  J18.9 CT Chest Wo Contrast  6. Centrilobular emphysema (HCC)  J43.2 Pulmonary Function Test      Discussion: This is a 50 year old gentleman recent hospitalization this summer with community-acquired pneumonia, empyema.  Treated with antibiotics, pigtail catheter drainage.  He had a more proximal spiculated lesion that was adjacent to the empyema that was potentially concerning for malignancy.  This is decreasing slowly in size over time suggesting of inflammatory etiology versus malignancy.  Today in the office we reviewed the patient's images from June September and October.  He is slowly improving.  He is a current smoker has smoked for a long time.  At this point willing to quit and asking for help.  Please see smoking cessation recommendations below.   Plan: Repeat noncontrasted CT of the chest in 3 months.  This to follow the pleural disease and continued with hopeful resolution of the loculated effusion.  I suspect he will be left with scarring there.  I did discuss case with thoracic surgery in the past. Patient was instructed to follow himself and observe closely  for any sign of infection. Full pulmonary function test completed at the next office visit, he does have emphysema on his CT imaging. Continue Anoro Ellipta. Free samples of the Anoro today. Help with cost. [employer] financial aid application for inhaler cost.   Smoking Cessation Counseling:    The patient's current tobacco use: currently 1ppd, 30 years The patient was advised to quit and impact of smoking on their health.  I assessed the patient's willingness to attempt to quit. He is ready to quit he says  I provided methods and skills for cessation. We discussed tapering.  We reviewed medication management of smoking session drugs if appropriate. Done gum and nicotin supplements before. He has never used chantix before but willing to try Resources to help quit smoking were provided. A smoking cessation quit date was set: May 01, 2020 Follow-up was arranged in our clinic.  The amount of time spent counseling patient was 12 mins separate of office note.      HPI   Prior to Admission medications   Medication Sig Start Date End Date Taking? Authorizing Provider  ibuprofen (ADVIL) 400 MG tablet Take 1 tablet (400 mg total) by mouth every 8 (eight) hours as needed for moderate pain. 09/27/19  Yes Rodolph Bong, MD  Multiple Vitamin (MULTIVITAMIN WITH MINERALS) TABS tablet Take 1 tablet by mouth daily. 09/28/19  Yes Rodolph Bong, MD  umeclidinium-vilanterol [facility] ELLIPTA) 62.5-25  MCG/INH AEPB Inhale 1 puff into the lungs daily. 03/05/20  Yes Icard, Bradley L, DO  albuterol (VENTOLIN HFA) 108 (90 Base) MCG/ACT inhaler Inhale 2 puffs into the lungs every 4 (four) hours as needed for wheezing or shortness of breath. 09/29/20   Georgina Quint, MD  folic acid (FOLVITE) 1 MG tablet Take 1 tablet (1 mg total) by mouth daily. Patient not taking: Reported on 09/29/2020 09/28/19   Rodolph Bong, MD  pantoprazole (PROTONIX) 40 MG tablet Take 1 tablet (40 mg total) by mouth daily. Patient  not taking: Reported on 09/29/2020 09/28/19   Rodolph Bong, MD  umeclidinium-vilanterol [facility] ELLIPTA) 62.5-25 MCG/INH AEPB Inhale 1 puff into the lungs daily. Patient not taking: Reported on 09/29/2020 02/04/20   Bevelyn Ngo, NP  umeclidinium-vilanterol (ANORO ELLIPTA) 62.5-25 MCG/INH AEPB Inhale 1 puff into the lungs daily. 09/29/20   Georgina Quint, MD  varenicline (CHANTIX PAK) 0.5 MG X 11 & 1 MG X 42 tablet Take one 0.5 mg tablet by mouth once daily for 3 days, then increase to one 0.5 mg tablet twice daily for 4 days, then increase to one 1 mg tablet twice daily. Patient not taking: Reported on 09/29/2020 03/05/20   [name], DO    No Known Allergies  Patient Active Problem List   Diagnosis Date Noted  . Alcohol abuse 09/24/2019  . Tobacco abuse 09/24/2019    Past Medical History:  Diagnosis Date  . Alcohol abuse     No past surgical history on file.  Social History   Socioeconomic History  . Marital status: Single    Spouse name: Not on file  . Number of children: Not on file  . Years of education: Not on file  . Highest education level: Not on file  Occupational History  . Not on file  Tobacco Use  . Smoking status: Current Every Day Smoker    Packs/day: 1.50    Years: 35.00    Pack years: 52.50    Types: Cigarettes    Start date: 69  . Smokeless tobacco: Never Used  . Tobacco comment: currently smoking 14cigs per day as of 7/2  Vaping Use  . Vaping Use: Never used  Substance and Sexual Activity  . Alcohol use: Not Currently  . Drug use: Yes    Types: Methamphetamines  . Sexual activity: Not on file  Other Topics Concern  . Not on file  Social History Narrative  . Not on file   Social Determinants of Health   Financial Resource Strain: Not on file  Food Insecurity: Not on file  Transportation Needs: Not on file  Physical Activity: Not on file  Stress: Not on file  Social Connections: Not on file  Intimate Partner Violence: Not on  file    No family history on file.   Review of Systems  Constitutional: Negative.  Negative for chills and fever.  HENT: Negative.  Negative for congestion and sore throat.   Respiratory: Negative.  Negative for cough and shortness of breath.   Cardiovascular: Negative for chest pain and palpitations.  Gastrointestinal: Negative for abdominal pain, diarrhea, nausea and vomiting.  Genitourinary: Negative.  Negative for dysuria and hematuria.  Skin: Negative.  Negative for rash.  Neurological: Negative.  Negative for dizziness and headaches.  All other systems reviewed and are negative.  Today's Vitals   09/29/20 1511  BP: (!) 158/58  Pulse: 83  Temp: 97.7 F (36.5 C)  TempSrc: Oral  SpO2:  97%  Weight: 176 lb 6.4 oz (80 kg)  Height: 6\' 1"  (1.854 m)   Body mass index is 23.27 kg/m.   Physical Exam Vitals reviewed.  Constitutional:      Appearance: Normal appearance.  HENT:     Head: Normocephalic.  Eyes:     Extraocular Movements: Extraocular movements intact.     Pupils: Pupils are equal, round, and reactive to light.     Comments: Pinpoint pupils  Cardiovascular:     Rate and Rhythm: Normal rate and regular rhythm.     Pulses: Normal pulses.     Heart sounds: Normal heart sounds.  Pulmonary:     Effort: Pulmonary effort is normal.     Breath sounds: Normal breath sounds.  Musculoskeletal:        General: Normal range of motion.     Cervical back: Normal range of motion and neck supple.  Skin:    General: Skin is warm and dry.     Capillary Refill: Capillary refill takes less than 2 seconds.  Neurological:     General: No focal deficit present.     Mental Status: He is alert and oriented to person, place, and time.  Psychiatric:        Mood and Affect: Mood normal.        Behavior: Behavior normal.      ASSESSMENT & PLAN: A total of 45 minutes was spent with the patient and counseling/coordination of care regarding COPD and emphysema and its management,  lung cancer, smoking and cardiovascular risks associated with it, smoking cessation advise, modifiable cardiac risk factors, health maintenance items, review of past medical history, review of most recent pulmonary office visit notes, review of most recent diagnostic imaging reports, review of most recent blood work results, prognosis, documentation, and need for follow-up.  Centrilobular emphysema (HCC) Patient very much aware of diagnosis.  Presently on Anoro Ellipta and albuterol inhalers.  Medications refilled as requested.  Needs to follow-up with pulmonary doctor and needs to get CT of the chest done as order already.  Current smoker Smoking cessation advice given.  Jami was seen today for new patient (initial visit) and referral.  Diagnoses and all orders for this visit:  Centrilobular emphysema (HCC) -     albuterol (VENTOLIN HFA) 108 (90 Base) MCG/ACT inhaler; Inhale 2 puffs into the lungs every 4 (four) hours as needed for wheezing or shortness of breath. -     umeclidinium-vilanterol (ANORO ELLIPTA) 62.5-25 MCG/INH AEPB; Inhale 1 puff into the lungs daily. -     CBC with Differential/Platelet -     Comprehensive metabolic panel -     Hemoglobin A1c -     Lipid panel  Tobacco abuse  Current smoker  Encounter to establish care    Patient Instructions   Chronic Obstructive Pulmonary Disease  Chronic obstructive pulmonary disease (COPD) is a long-term (chronic) lung problem. When you have COPD, it is hard for air to get in and out of your lungs. Usually the condition gets worse over time, and your lungs will never return to normal. There are things you can do to keep yourself as healthy as possible. What are the causes?  Smoking. This is the most common cause.  Certain genes passed from parent to child (inherited). What increases the risk?  Being exposed to secondhand smoke from cigarettes, pipes, or cigars.  Being exposed to chemicals and other irritants, such as  fumes and dust in the work environment.  Having chronic  lung conditions or infections. What are the signs or symptoms?  Shortness of breath, especially during physical activity.  A long-term cough with a large amount of thick mucus. Sometimes, the cough may not have any mucus (dry cough).  Wheezing.  Breathing quickly.  Skin that looks gray or blue, especially in the fingers, toes, or lips.  Feeling tired (fatigue).  Weight loss.  Chest tightness.  Having infections often.  Episodes when breathing symptoms become much worse (exacerbations). At the later stages of this disease, you may have swelling in the ankles, feet, or legs. How is this treated?  Taking medicines.  Quitting smoking, if you smoke.  Rehabilitation. This includes steps to make your body work better. It may involve a team of specialists.  Doing exercises.  Making changes to your diet.  Using oxygen.  Lung surgery.  Lung transplant.  Comfort measures (palliative care). Follow these instructions at home: Medicines  Take over-the-counter and prescription medicines only as told by your doctor.  Talk to your doctor before taking any cough or allergy medicines. You may need to avoid medicines that cause your lungs to be dry. Lifestyle  If you smoke, stop smoking. Smoking makes the problem worse.  Do not smoke or use any products that contain nicotine or tobacco. If you need help quitting, ask your doctor.  Avoid being around things that make your breathing worse. This may include smoke, chemicals, and fumes.  Stay active, but remember to rest as well.  Learn and use tips on how to manage stress and control your breathing.  Make sure you get enough sleep. Most adults need at least 7 hours of sleep every night.  Eat healthy foods. Eat smaller meals more often. Rest before meals. Controlled breathing Learn and use tips on how to control your breathing as told by your doctor. Try:  Breathing  in (inhaling) through your nose for 1 second. Then, pucker your lips and breath out (exhale) through your lips for 2 seconds.  Putting one hand on your belly (abdomen). Breathe in slowly through your nose for 1 second. Your hand on your belly should move out. Pucker your lips and breathe out slowly through your lips. Your hand on your belly should move in as you breathe out.   Controlled coughing Learn and use controlled coughing to clear mucus from your lungs. Follow these steps: 1. Lean your head a little forward. 2. Breathe in deeply. 3. Try to hold your breath for 3 seconds. 4. Keep your mouth slightly open while coughing 2 times. 5. Spit any mucus out into a tissue. 6. Rest and do the steps again 1 or 2 times as needed. General instructions  Make sure you get all the shots (vaccines) that your doctor recommends. Ask your doctor about a flu shot and a pneumonia shot.  Use oxygen therapy and pulmonary rehabilitation if told by your doctor. If you need home oxygen therapy, ask your doctor if you should buy a tool to measure your oxygen level (oximeter).  Make a COPD action plan with your doctor. This helps you to know what to do if you feel worse than usual.  Manage any other conditions you have as told by your doctor.  Avoid going outside when it is very hot, cold, or humid.  Avoid people who have a sickness you can catch (contagious).  Keep all follow-up visits. Contact a doctor if:  You cough up more mucus than usual.  There is a change in the color or  thickness of the mucus.  It is harder to breathe than usual.  Your breathing is faster than usual.  You have trouble sleeping.  You need to use your medicines more often than usual.  You have trouble doing your normal activities such as getting dressed or walking around the house. Get help right away if:  You have shortness of breath while resting.  You have shortness of breath that stops you from: ? Being able to  talk. ? Doing normal activities.  Your chest hurts for longer than 5 minutes.  Your skin color is more blue than usual.  Your pulse oximeter shows that you have low oxygen for longer than 5 minutes.  You have a fever.  You feel too tired to breathe normally. These symptoms may represent a serious problem that is an emergency. Do not wait to see if the symptoms will go away. Get medical help right away. Call your local emergency services (911 in the [place] Do not drive yourself to the hospital. Summary  Chronic obstructive pulmonary disease (COPD) is a long-term lung problem.  The way your lungs work will never return to normal. Usually the condition gets worse over time. There are things you can do to keep yourself as healthy as possible.  Take over-the-counter and prescription medicines only as told by your doctor.  If you smoke, stop. Smoking makes the problem worse. This information is not intended to replace advice given to you by your health care provider. Make sure you discuss any questions you have with your health care provider. Document Revised: 02/24/2020 Document Reviewed: 02/24/2020 Elsevier Patient Education  2021 Elsevier Inc.     Edwina BarthMiguel Romayne Ticas, MD [facility]

## 2020-09-29 NOTE — Assessment & Plan Note (Signed)
Patient very much aware of diagnosis.  Presently on Anoro Ellipta and albuterol inhalers.  Medications refilled as requested.  Needs to follow-up with pulmonary doctor and needs to get CT of the chest done as order already.

## 2020-09-29 NOTE — Patient Instructions (Signed)
Chronic Obstructive Pulmonary Disease  Chronic obstructive pulmonary disease (COPD) is a long-term (chronic) lung problem. When you have COPD, it is hard for air to get in and out of your lungs. Usually the condition gets worse over time, and your lungs will never return to normal. There are things you can do to keep yourself as healthy as possible. What are the causes?  Smoking. This is the most common cause.  Certain genes passed from parent to child (inherited). What increases the risk?  Being exposed to secondhand smoke from cigarettes, pipes, or cigars.  Being exposed to chemicals and other irritants, such as fumes and dust in the work environment.  Having chronic lung conditions or infections. What are the signs or symptoms?  Shortness of breath, especially during physical activity.  A long-term cough with a large amount of thick mucus. Sometimes, the cough may not have any mucus (dry cough).  Wheezing.  Breathing quickly.  Skin that looks gray or blue, especially in the fingers, toes, or lips.  Feeling tired (fatigue).  Weight loss.  Chest tightness.  Having infections often.  Episodes when breathing symptoms become much worse (exacerbations). At the later stages of this disease, you may have swelling in the ankles, feet, or legs. How is this treated?  Taking medicines.  Quitting smoking, if you smoke.  Rehabilitation. This includes steps to make your body work better. It may involve a team of specialists.  Doing exercises.  Making changes to your diet.  Using oxygen.  Lung surgery.  Lung transplant.  Comfort measures (palliative care). Follow these instructions at home: Medicines  Take over-the-counter and prescription medicines only as told by your doctor.  Talk to your doctor before taking any cough or allergy medicines. You may need to avoid medicines that cause your lungs to be dry. Lifestyle  If you smoke, stop smoking. Smoking makes the  problem worse.  Do not smoke or use any products that contain nicotine or tobacco. If you need help quitting, ask your doctor.  Avoid being around things that make your breathing worse. This may include smoke, chemicals, and fumes.  Stay active, but remember to rest as well.  Learn and use tips on how to manage stress and control your breathing.  Make sure you get enough sleep. Most adults need at least 7 hours of sleep every night.  Eat healthy foods. Eat smaller meals more often. Rest before meals. Controlled breathing Learn and use tips on how to control your breathing as told by your doctor. Try:  Breathing in (inhaling) through your nose for 1 second. Then, pucker your lips and breath out (exhale) through your lips for 2 seconds.  Putting one hand on your belly (abdomen). Breathe in slowly through your nose for 1 second. Your hand on your belly should move out. Pucker your lips and breathe out slowly through your lips. Your hand on your belly should move in as you breathe out.   Controlled coughing Learn and use controlled coughing to clear mucus from your lungs. Follow these steps: 1. Lean your head a little forward. 2. Breathe in deeply. 3. Try to hold your breath for 3 seconds. 4. Keep your mouth slightly open while coughing 2 times. 5. Spit any mucus out into a tissue. 6. Rest and do the steps again 1 or 2 times as needed. General instructions  Make sure you get all the shots (vaccines) that your doctor recommends. Ask your doctor about a flu shot and a pneumonia shot.    Use oxygen therapy and pulmonary rehabilitation if told by your doctor. If you need home oxygen therapy, ask your doctor if you should buy a tool to measure your oxygen level (oximeter).  Make a COPD action plan with your doctor. This helps you to know what to do if you feel worse than usual.  Manage any other conditions you have as told by your doctor.  Avoid going outside when it is very hot, cold, or  humid.  Avoid people who have a sickness you can catch (contagious).  Keep all follow-up visits. Contact a doctor if:  You cough up more mucus than usual.  There is a change in the color or thickness of the mucus.  It is harder to breathe than usual.  Your breathing is faster than usual.  You have trouble sleeping.  You need to use your medicines more often than usual.  You have trouble doing your normal activities such as getting dressed or walking around the house. Get help right away if:  You have shortness of breath while resting.  You have shortness of breath that stops you from: ? Being able to talk. ? Doing normal activities.  Your chest hurts for longer than 5 minutes.  Your skin color is more blue than usual.  Your pulse oximeter shows that you have low oxygen for longer than 5 minutes.  You have a fever.  You feel too tired to breathe normally. These symptoms may represent a serious problem that is an emergency. Do not wait to see if the symptoms will go away. Get medical help right away. Call your local emergency services (911 in the [place] Do not drive yourself to the hospital. Summary  Chronic obstructive pulmonary disease (COPD) is a long-term lung problem.  The way your lungs work will never return to normal. Usually the condition gets worse over time. There are things you can do to keep yourself as healthy as possible.  Take over-the-counter and prescription medicines only as told by your doctor.  If you smoke, stop. Smoking makes the problem worse. This information is not intended to replace advice given to you by your health care provider. Make sure you discuss any questions you have with your health care provider. Document Revised: 02/24/2020 Document Reviewed: 02/24/2020 Elsevier Patient Education  2021 Elsevier Inc.   

## 2020-09-29 NOTE — Assessment & Plan Note (Signed)
Smoking cessation advice given. 

## 2020-09-30 LAB — COMPREHENSIVE METABOLIC PANEL
ALT: 17 U/L (ref 0–53)
AST: 17 U/L (ref 0–37)
Albumin: 3.9 g/dL (ref 3.5–5.2)
Alkaline Phosphatase: 100 U/L (ref 39–117)
BUN: 17 mg/dL (ref 6–23)
CO2: 24 mEq/L (ref 19–32)
Calcium: 9.4 mg/dL (ref 8.4–10.5)
Chloride: 104 mEq/L (ref 96–112)
Creatinine, Ser: 1.01 mg/dL (ref 0.40–1.50)
GFR: 86.65 mL/min (ref >60.00)
Glucose, Bld: 86 mg/dL (ref 70–99)
Potassium: 4.3 mEq/L (ref 3.5–5.1)
Sodium: 141 mEq/L (ref 135–145)
Total Bilirubin: 0.4 mg/dL (ref 0.2–1.2)
Total Protein: 7.6 g/dL (ref 6.0–8.3)

## 2020-09-30 LAB — LIPID PANEL
Cholesterol: 145 mg/dL (ref 0–200)
HDL: 32.9 mg/dL — ABNORMAL LOW (ref >39.00)
NonHDL: 111.61
Total CHOL/HDL Ratio: 4
Triglycerides: 201 mg/dL — ABNORMAL HIGH (ref 0.0–149.0)
VLDL: 40.2 mg/dL — ABNORMAL HIGH (ref 0.0–40.0)

## 2020-10-04 LAB — LDL CHOLESTEROL, DIRECT: Direct LDL: 85 mg/dL

## 2021-03-08 ENCOUNTER — Ambulatory Visit: Payer: 59 | Admitting: Emergency Medicine

## 2021-03-17 ENCOUNTER — Ambulatory Visit: Payer: 59 | Admitting: Emergency Medicine

## 2021-03-19 IMAGING — CR DG ABDOMEN 1V
1 series · 1 of 1 positions shown · non-contrast
Comparison: None.

CLINICAL DATA: Abdominal pain and distention.

EXAM:
ABDOMEN - 1 VIEW

[x abdomen supine]
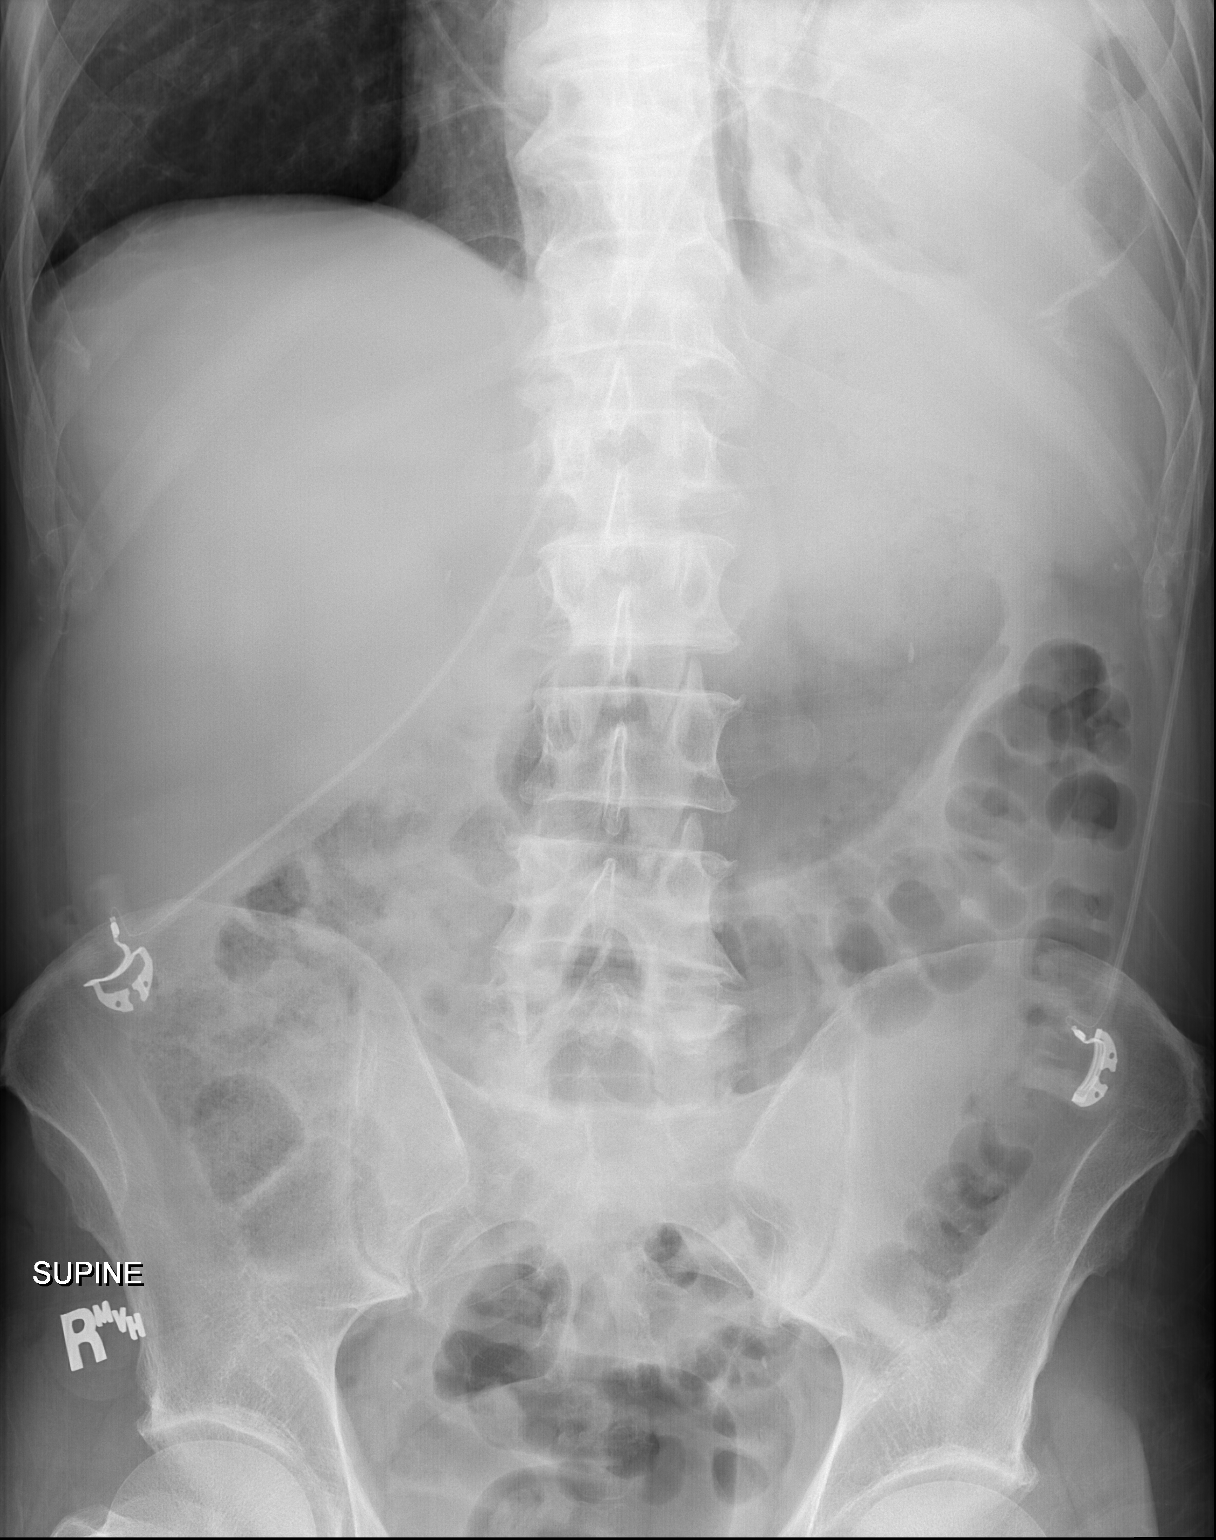

[1 of 1 positions shown; findings below may reference images not displayed]

FINDINGS: There appears to be a large left-sided pleural effusion. The liver
is enlarged measuring approximately 23 cm craniocaudad. The bowel
gas pattern is nonobstructive.
IMPRESSION: 1. Nonobstructive bowel gas pattern.
2. Probable large left-sided pleural effusion.
3. Hepatomegaly.

## 2021-03-19 IMAGING — CT CT CHEST W/O CM
2 of 3 series · 15 of 36 positions shown, 18 images · non-contrast
Comparison: CT angio chest dated September 21, 2019

CLINICAL DATA: Empyema

EXAM:
CT CHEST WITHOUT CONTRAST
TECHNIQUE: Multidetector CT imaging of the chest was performed following the
standard protocol without IV contrast.

[Series 2: thorax · axial · 0.83mm/px · z∈[+1358,+1664]mm · 12 of 181 slices shown, 15 images]
[im 14/181  mediastinal]
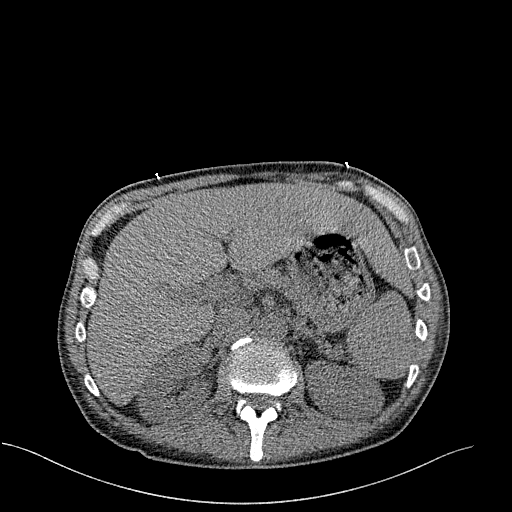
[im 14/181  lung]
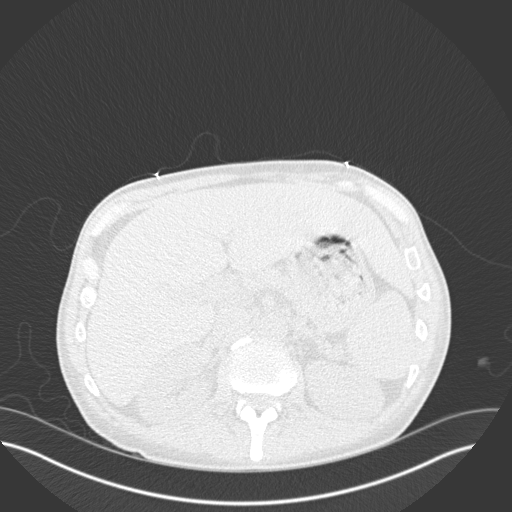
[im 27/181  lung]
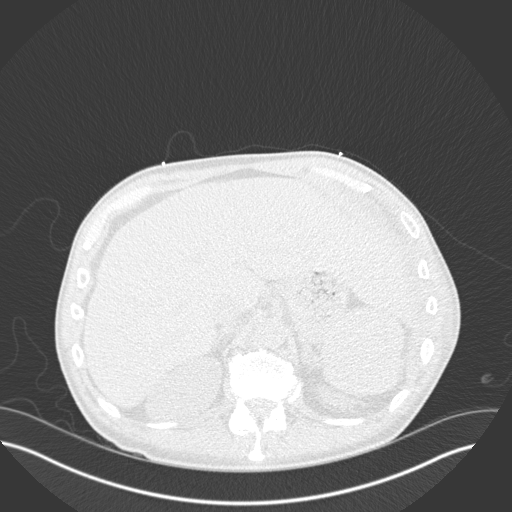
[im 41/181  lung]
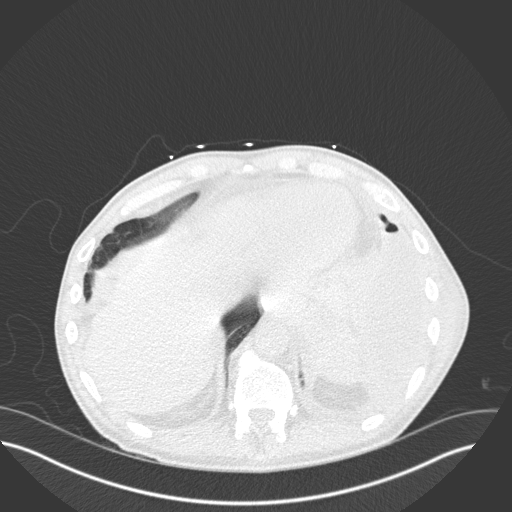
[im 54/181  lung]
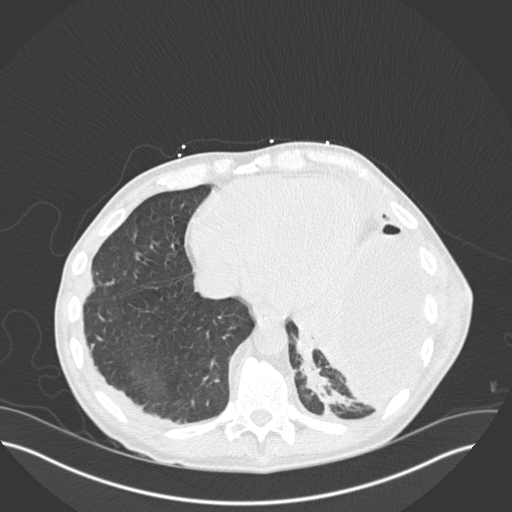
[im 67/181  mediastinal]
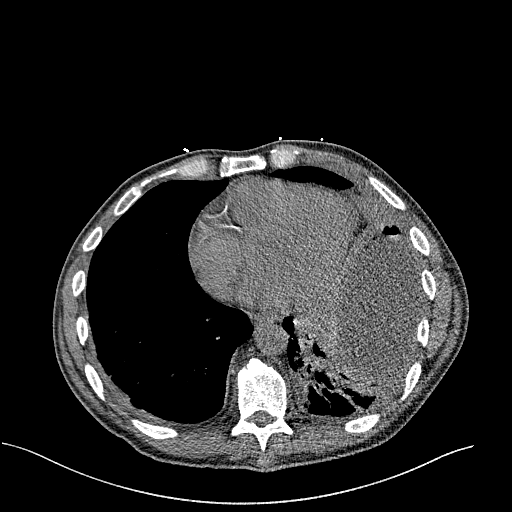
[im 67/181  lung]
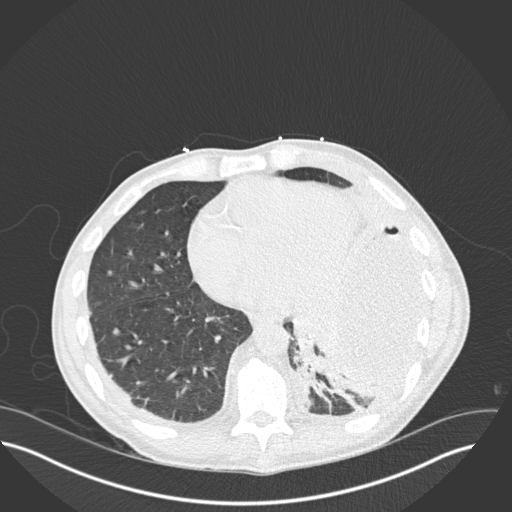
[im 81/181  lung]
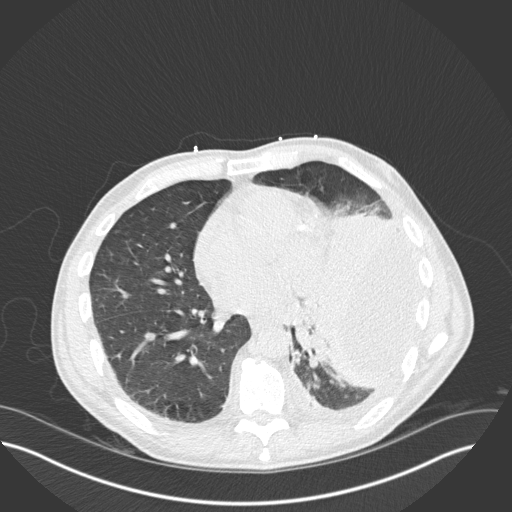
[im 101/181  lung]
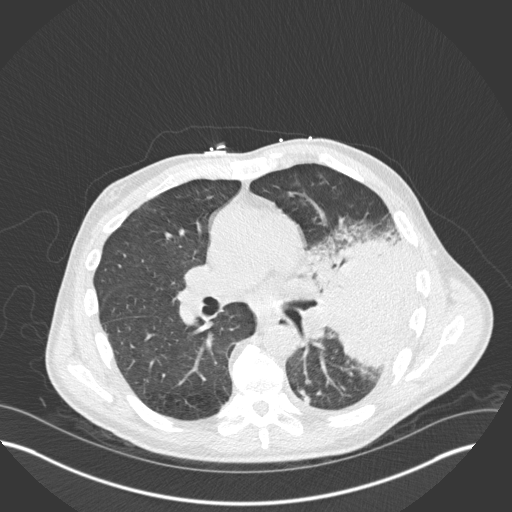
[im 114/181  lung]
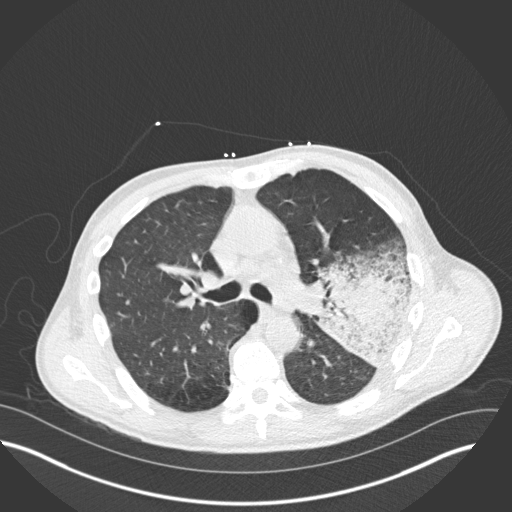
[im 127/181  mediastinal]
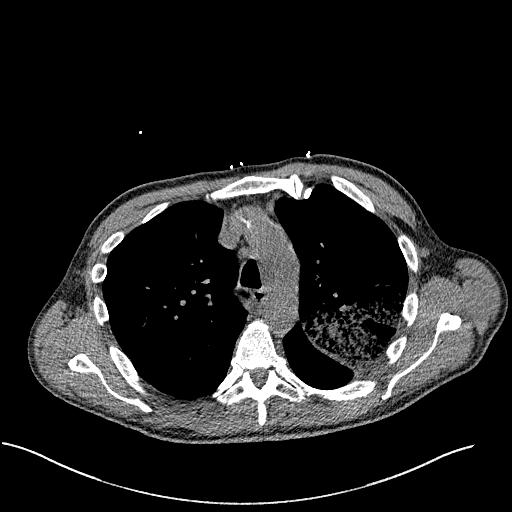
[im 127/181  lung]
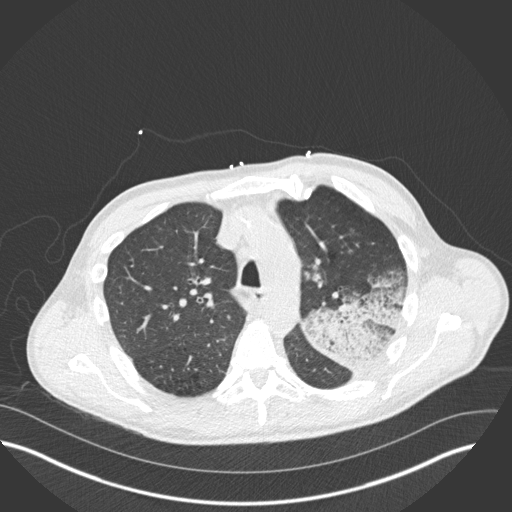
[im 141/181  lung]
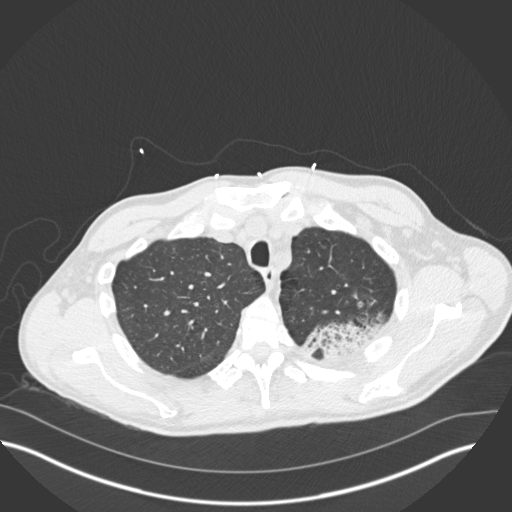
[im 154/181  lung]
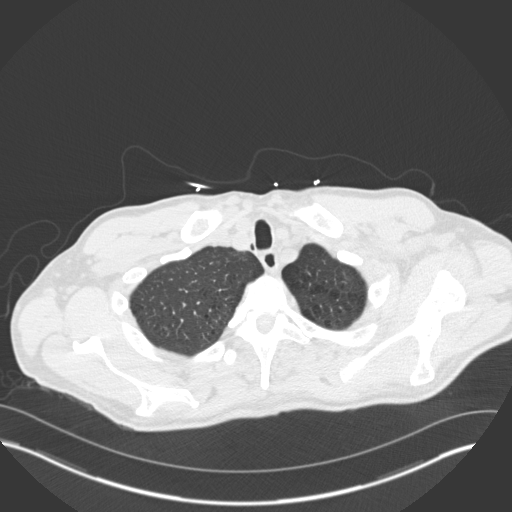
[im 167/181  lung]
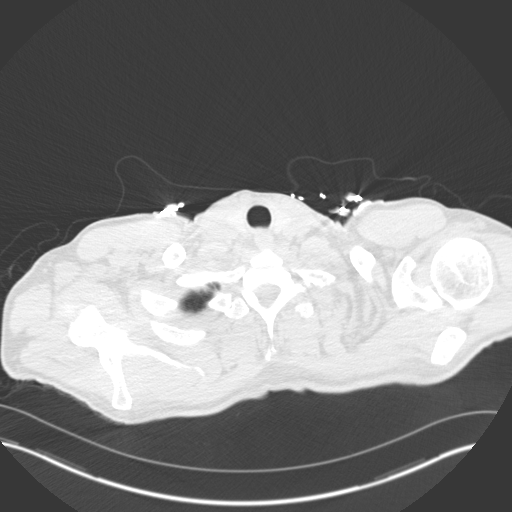

[Series 4: coronal · coronal · 0.71mm/px · 3 of 151 slices shown]
[im 31/151  lung]
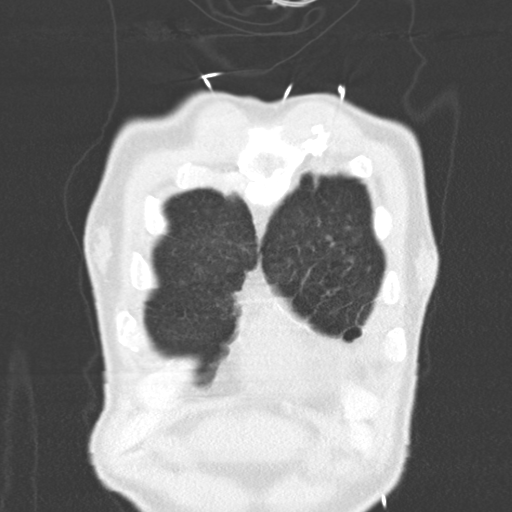
[im 61/151  lung]
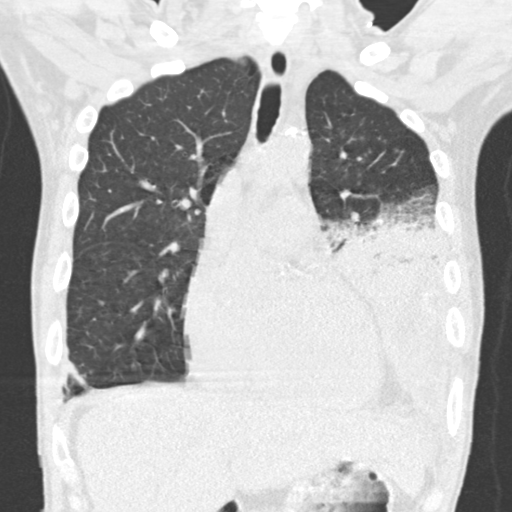
[im 91/151  lung]
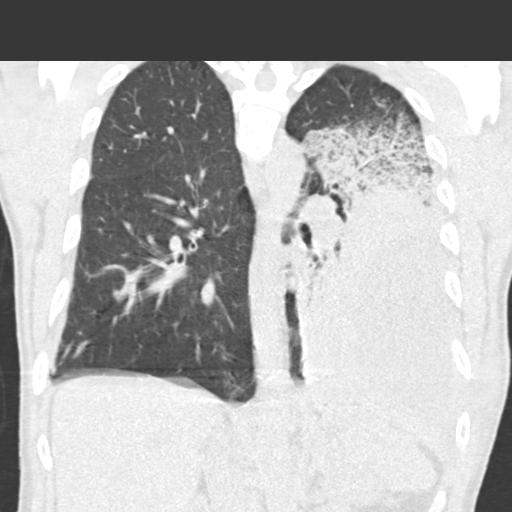

[15 of 36 positions shown; findings below may reference images not displayed]

FINDINGS: Cardiovascular: Heart size is normal. Coronary artery calcifications
are noted. There is no significant pericardial effusion. There is no
evidence for thoracic aortic aneurysm. There are atherosclerotic
changes of the thoracic aorta. The intracardiac blood pool is
hypodense relative to the adjacent myocardium consistent with
anemia.

Mediastinum/Nodes:

--there are enlarged mediastinal lymph nodes.

--there are enlarged hilar lymph nodes.

-- No axillary lymphadenopathy.

-- No supraclavicular lymphadenopathy.

-- Normal thyroid gland where visualized.

-  Unremarkable esophagus.

Lungs/Pleura: There is extensive consolidation in the left upper
lobe which has significantly progressed since the prior study. There
is consolidation versus atelectasis at the left lung base, not well
evaluated in the absence of IV contrast. Emphysematous changes are
noted. There has been development of a thick walled air and fluid
collection in the left hemithorax measuring approximately 14.8 x
cm. Between the chest wall and this fluid collection there does not
appear to be normal intervening lung, however evaluation is limited
by lack of IV contrast. There are small bilateral pleural effusions.

Upper Abdomen: Contrast bolus timing is not optimized for evaluation
of the abdominal organs. The visualized portions of the organs of
the upper abdomen are normal.

Musculoskeletal: No chest wall abnormality. No bony spinal canal
stenosis.
IMPRESSION: 1. Recurrent left-sided empyema as detailed above.
2. Worsening consolidation in the left upper and left lower lobes.
3. Small bilateral pleural effusions.
4. Mediastinal and hilar adenopathy, presumably reactive.

Aortic Atherosclerosis (J2D7U-SS0.0) and Emphysema ([id number]-DD3.X).

These results will be called to the ordering clinician or
representative by the Radiologist Assistant, and communication
documented in the PACS or [REDACTED].

## 2021-06-10 IMAGING — DX DG CHEST 2V
2 series · 2 of 2 positions shown · non-contrast
Comparison: Most recent radiograph 10/31/2019. CT 10/12/2019

CLINICAL DATA: Community acquired pneumonia left lung.

EXAM:
CHEST - 2 VIEW

[chest pa]
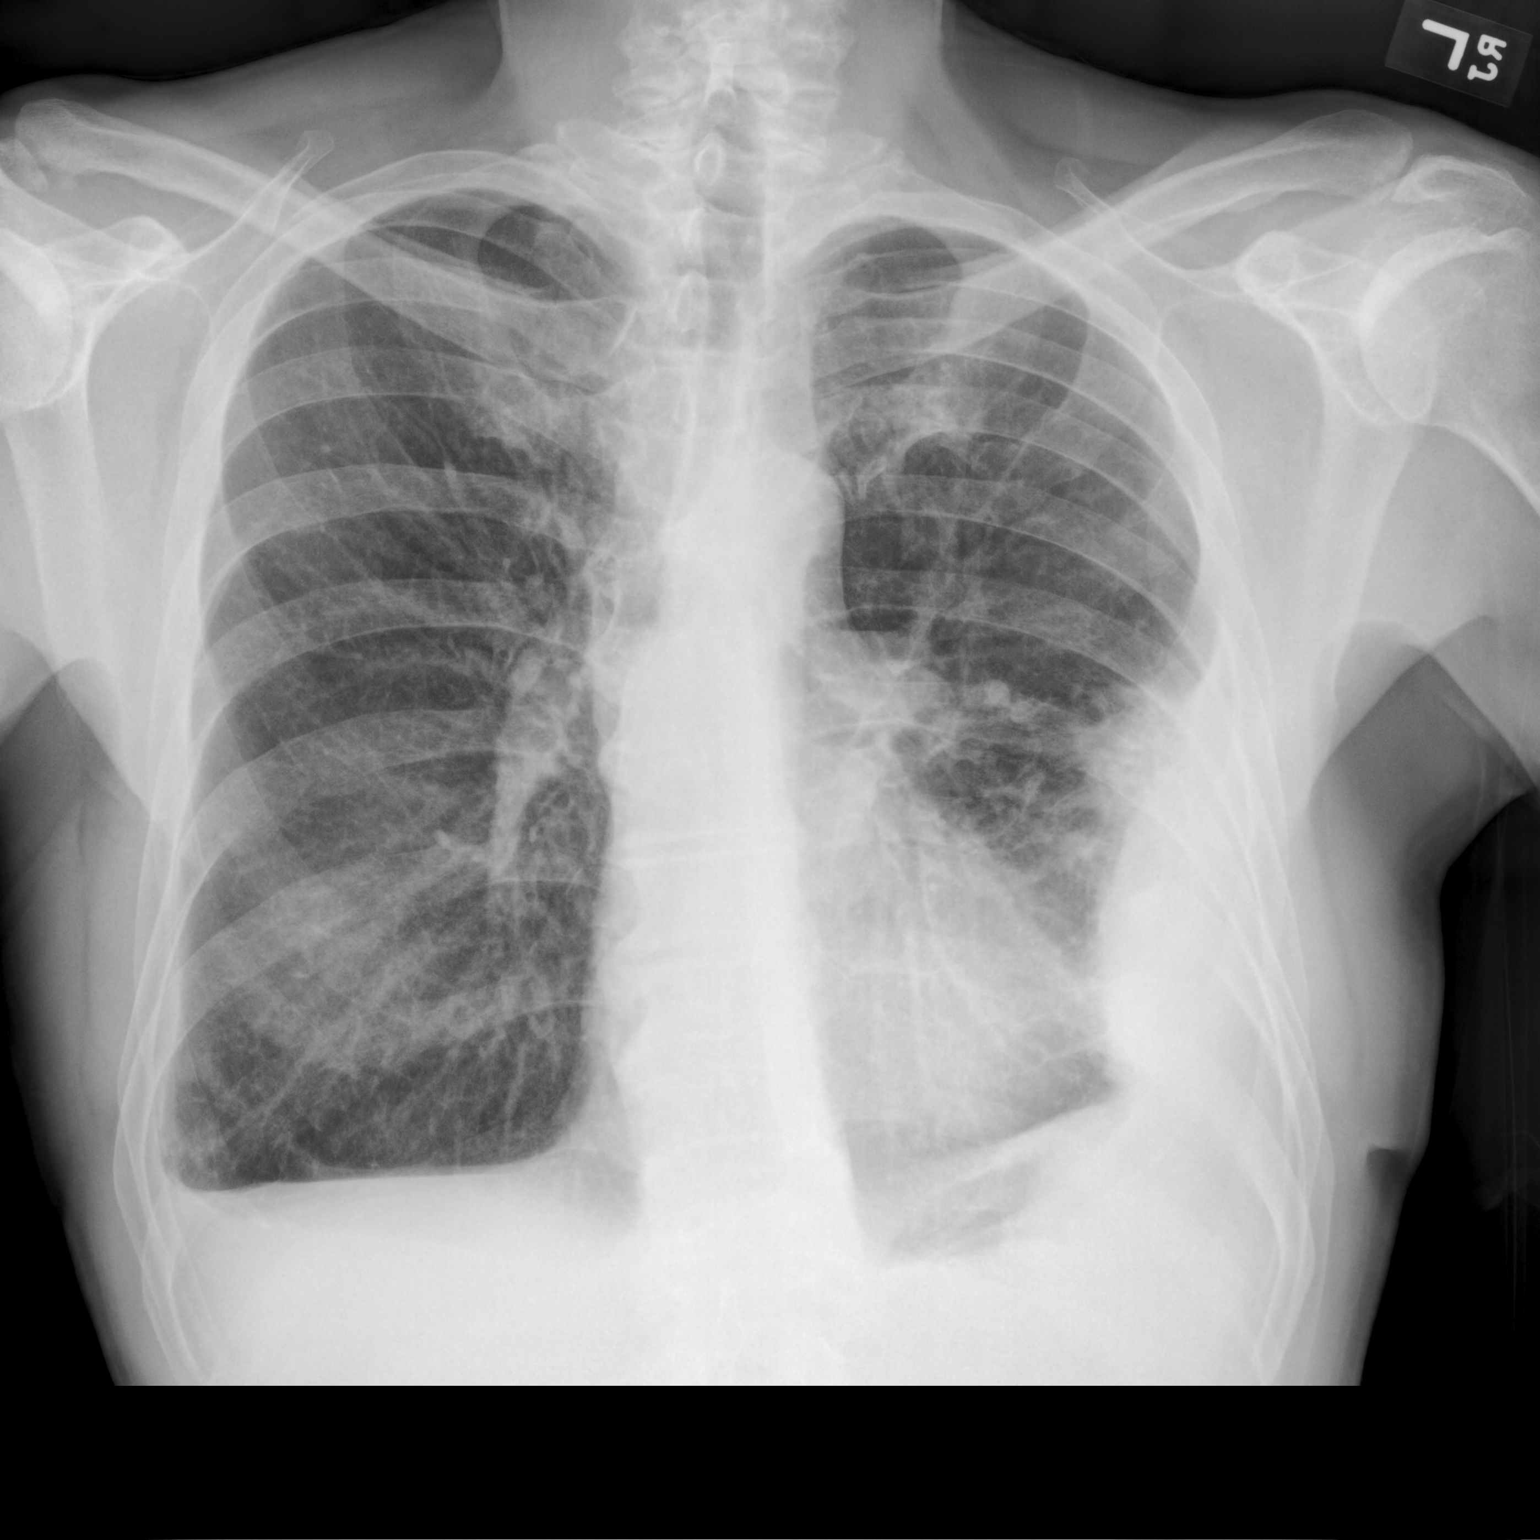

[chest lat]
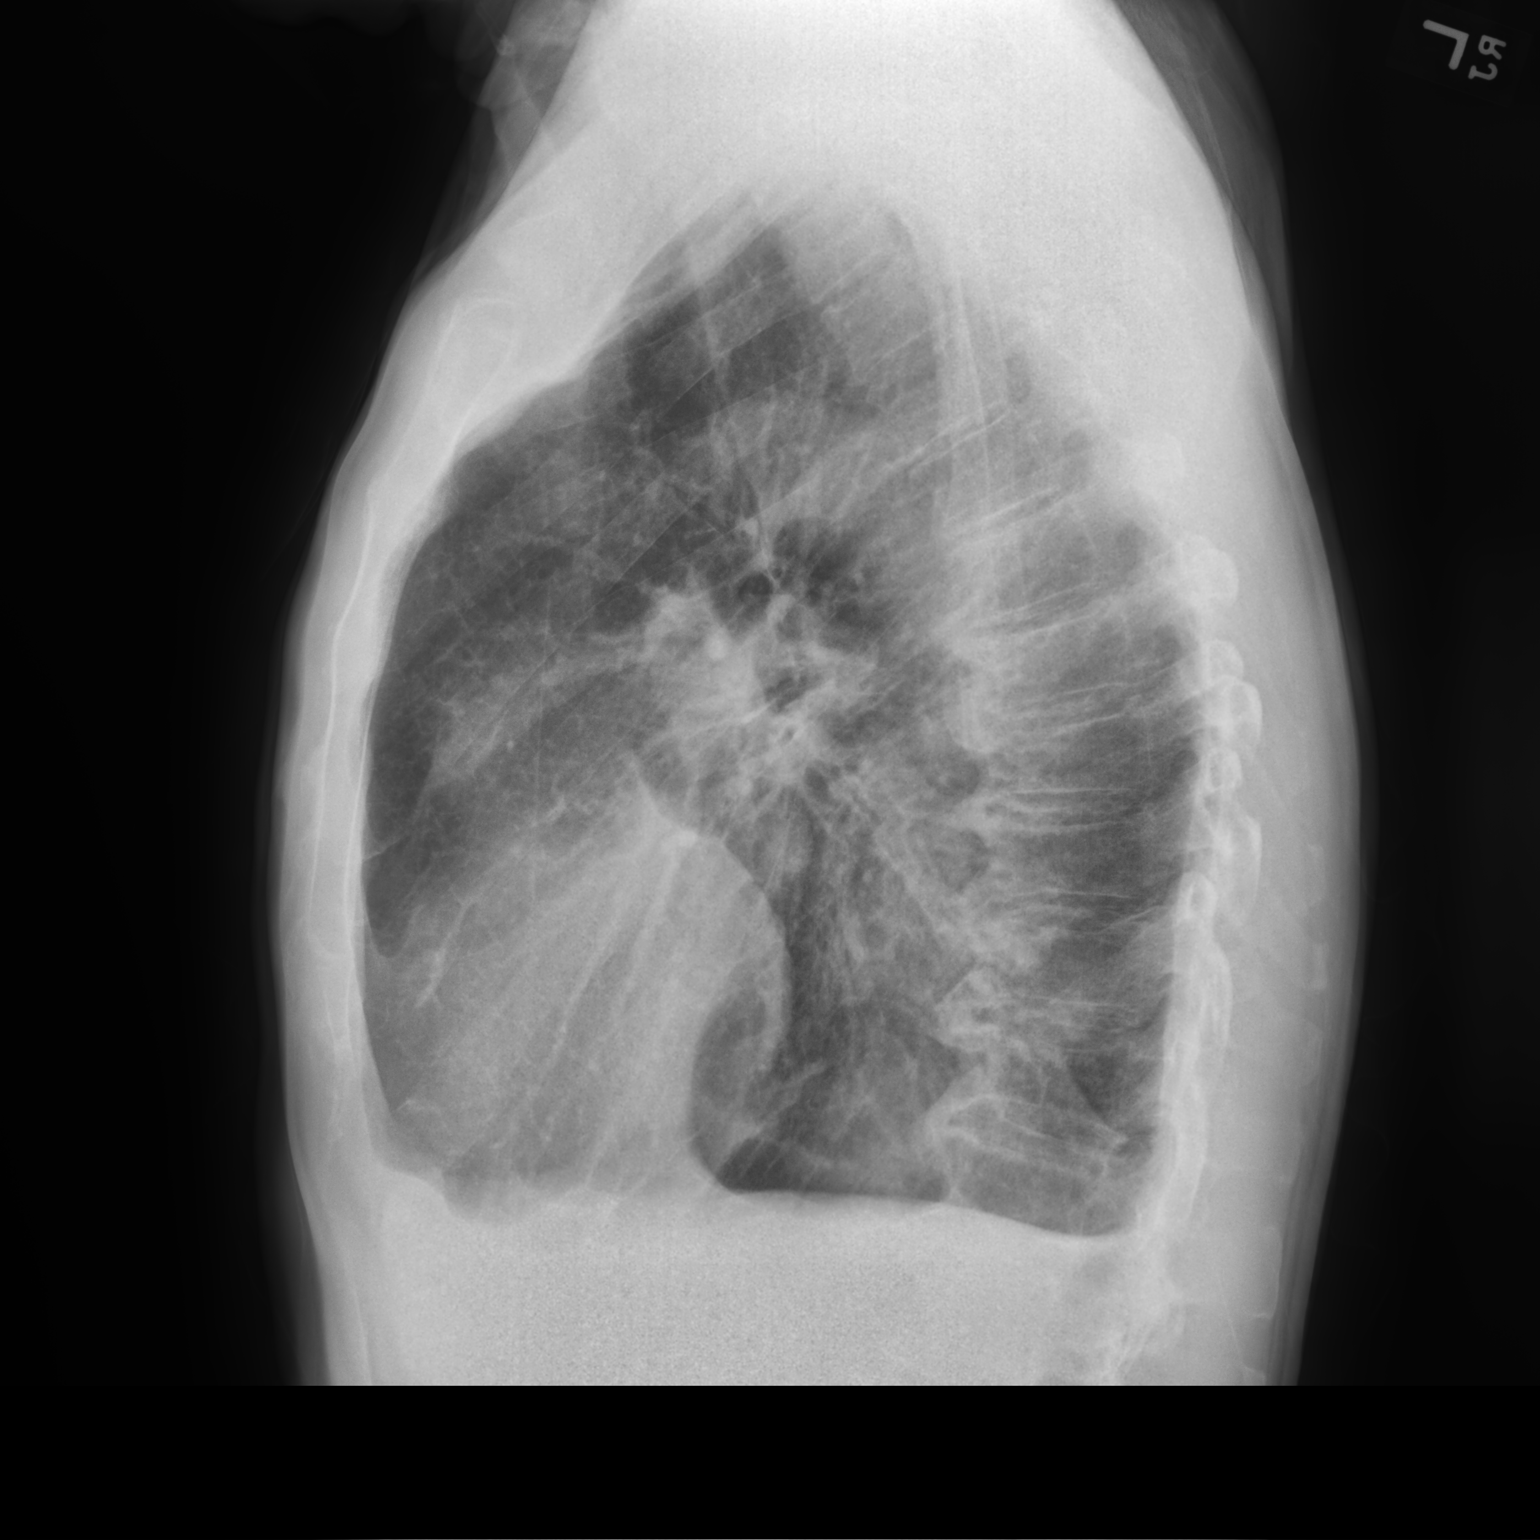

[2 of 2 positions shown; findings below may reference images not displayed]

FINDINGS: Increasing loculated pleural fluid/opacity in the left lung base
from prior exam, the air component has resolved. There is an
adjacent nodular opacity that is not well characterized by
radiograph. Chronic bronchial thickening and hyperinflation.
Blunting of the right costophrenic angle may represent small
effusion, unchanged. Stable heart size and mediastinal contours. No
pneumothorax. Degenerative change in the spine.
IMPRESSION: 1. Persistent and slightly increasing loculated pleural
fluid/opacity in the left lung base from prior exam, the air
component has resolved. An adjacent nodular opacity is not well
characterized by radiograph. Consider re-evaluation with chest CT
(preferably with IV contrast) for more detailed parenchymal
assessment including the nodular opacity.
2. Unchanged blunting of the right costophrenic angle may represent
small effusion.

## 2022-05-11 ENCOUNTER — Telehealth: Payer: Self-pay

## 2022-05-11 NOTE — Telephone Encounter (Signed)
Transition Care Management Follow-up Telephone Call Date of discharge and from where: [facility] How have you been since you were released from the hospital? better Any questions or concerns? No  Items Reviewed: Did the pt receive and understand the discharge instructions provided? Yes  Medications obtained and verified? Yes  Other? No  Any new allergies since your discharge? No  Dietary orders reviewed? Yes Do you have support at home? Yes   Home Care and Equipment/Supplies: Were home health services ordered? no If so, what is the name of the agency? N/a  Has the agency set up a time to come to the patient's home? not applicable Were any new equipment or medical supplies ordered?  No What is the name of the medical supply agency? N/a Were you able to get the supplies/equipment? not applicable Do you have any questions related to the use of the equipment or supplies? No  Functional Questionnaire: (I = Independent and D = Dependent) ADLs: I  Bathing/Dressing- I  Meal Prep- I  Eating- I  Maintaining continence- I  Transferring/Ambulation- I  Managing Meds- I  Follow up appointments reviewed:  PCP Hospital f/u appt confirmed? Yes  Scheduled to see Dr Mitchel Honour on 05/23/2022 @ 11:00. [facility] f/u appt confirmed? No   Are transportation arrangements needed? No  If their condition worsens, is the pt aware to call PCP or go to the Emergency Dept.? Yes Was the patient provided with contact information for the PCP's office or ED? Yes Was to pt encouraged to call back with questions or concerns? Yes Juanda Crumble, LPN [facility] [phone]
